# Patient Record
Sex: Female | Born: 1975
Health system: Southern US, Community
[De-identification: ages and names within clinical notes are randomized; demographics above are authoritative.]

## PROBLEM LIST (undated history)

## (undated) DIAGNOSIS — E7800 Pure hypercholesterolemia, unspecified: Secondary | ICD-10-CM

## (undated) DIAGNOSIS — K219 Gastro-esophageal reflux disease without esophagitis: Secondary | ICD-10-CM

## (undated) DIAGNOSIS — G43909 Migraine, unspecified, not intractable, without status migrainosus: Secondary | ICD-10-CM

## (undated) DIAGNOSIS — Z8669 Personal history of other diseases of the nervous system and sense organs: Secondary | ICD-10-CM

## (undated) DIAGNOSIS — Z860100 Personal history of colon polyps, unspecified: Secondary | ICD-10-CM

## (undated) DIAGNOSIS — Z21 Asymptomatic human immunodeficiency virus [HIV] infection status: Secondary | ICD-10-CM

## (undated) DIAGNOSIS — F329 Major depressive disorder, single episode, unspecified: Secondary | ICD-10-CM

## (undated) DIAGNOSIS — Z8742 Personal history of other diseases of the female genital tract: Secondary | ICD-10-CM

## (undated) DIAGNOSIS — G4730 Sleep apnea, unspecified: Secondary | ICD-10-CM

## (undated) DIAGNOSIS — R12 Heartburn: Secondary | ICD-10-CM

## (undated) DIAGNOSIS — J302 Other seasonal allergic rhinitis: Secondary | ICD-10-CM

## (undated) DIAGNOSIS — F32A Depression, unspecified: Secondary | ICD-10-CM

## (undated) DIAGNOSIS — B20 Human immunodeficiency virus [HIV] disease: Secondary | ICD-10-CM

## (undated) DIAGNOSIS — N189 Chronic kidney disease, unspecified: Secondary | ICD-10-CM

## (undated) DIAGNOSIS — N289 Disorder of kidney and ureter, unspecified: Secondary | ICD-10-CM

## (undated) DIAGNOSIS — J45909 Unspecified asthma, uncomplicated: Secondary | ICD-10-CM

## (undated) DIAGNOSIS — E669 Obesity, unspecified: Secondary | ICD-10-CM

## (undated) DIAGNOSIS — F411 Generalized anxiety disorder: Secondary | ICD-10-CM

## (undated) DIAGNOSIS — E538 Deficiency of other specified B group vitamins: Secondary | ICD-10-CM

## (undated) DIAGNOSIS — J189 Pneumonia, unspecified organism: Secondary | ICD-10-CM

## (undated) DIAGNOSIS — T7840XA Allergy, unspecified, initial encounter: Secondary | ICD-10-CM

## (undated) DIAGNOSIS — E559 Vitamin D deficiency, unspecified: Secondary | ICD-10-CM

## (undated) DIAGNOSIS — K829 Disease of gallbladder, unspecified: Secondary | ICD-10-CM

## (undated) DIAGNOSIS — F419 Anxiety disorder, unspecified: Secondary | ICD-10-CM

## (undated) DIAGNOSIS — R569 Unspecified convulsions: Secondary | ICD-10-CM

## (undated) HISTORY — PX: TOE SURGERY: SHX1073

## (undated) HISTORY — DX: Deficiency of other specified B group vitamins: E53.8

## (undated) HISTORY — DX: Pure hypercholesterolemia, unspecified: E78.00

## (undated) HISTORY — PX: UPPER GI ENDOSCOPY: SHX6162

## (undated) HISTORY — PX: ABDOMINAL HYSTERECTOMY: SHX81

## (undated) HISTORY — DX: Disease of gallbladder, unspecified: K82.9

## (undated) HISTORY — DX: Unspecified asthma, uncomplicated: J45.909

## (undated) HISTORY — DX: Personal history of other diseases of the female genital tract: Z87.42

## (undated) HISTORY — PX: COLONOSCOPY: SHX174

## (undated) HISTORY — DX: Heartburn: R12

## (undated) HISTORY — DX: Personal history of colon polyps, unspecified: Z86.0100

## (undated) HISTORY — DX: Vitamin D deficiency, unspecified: E55.9

## (undated) HISTORY — DX: Sleep apnea, unspecified: G47.30

## (undated) HISTORY — DX: Human immunodeficiency virus (HIV) disease: B20

## (undated) HISTORY — DX: Asymptomatic human immunodeficiency virus (hiv) infection status: Z21

## (undated) HISTORY — DX: Gastro-esophageal reflux disease without esophagitis: K21.9

## (undated) HISTORY — DX: Anxiety disorder, unspecified: F41.9

## (undated) HISTORY — DX: Personal history of colonic polyps: Z86.010

## (undated) HISTORY — DX: Generalized anxiety disorder: F41.1

## (undated) HISTORY — PX: WISDOM TOOTH EXTRACTION: SHX21

## (undated) HISTORY — DX: Depression, unspecified: F32.A

## (undated) HISTORY — DX: Allergy, unspecified, initial encounter: T78.40XA

## (undated) HISTORY — DX: Unspecified convulsions: R56.9

## (undated) HISTORY — DX: Migraine, unspecified, not intractable, without status migrainosus: G43.909

## (undated) HISTORY — DX: Personal history of other diseases of the nervous system and sense organs: Z86.69

## (undated) HISTORY — PX: CHOLECYSTECTOMY: SHX55

## (undated) HISTORY — DX: Obesity, unspecified: E66.9

---
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
## 1998-02-17 ENCOUNTER — Other Ambulatory Visit: Admission: RE | Admit: 1998-02-17 | Discharge: 1998-02-17 | Payer: Self-pay | Admitting: Obstetrics and Gynecology

## 1998-06-27 ENCOUNTER — Encounter: Admission: RE | Admit: 1998-06-27 | Discharge: 1998-09-25 | Payer: Self-pay | Admitting: Internal Medicine

## 1998-07-31 ENCOUNTER — Ambulatory Visit (HOSPITAL_COMMUNITY): Admission: RE | Admit: 1998-07-31 | Discharge: 1998-07-31 | Payer: Self-pay | Admitting: Gastroenterology

## 1999-11-19 ENCOUNTER — Other Ambulatory Visit: Admission: RE | Admit: 1999-11-19 | Discharge: 1999-11-19 | Payer: Self-pay | Admitting: Obstetrics and Gynecology

## 2000-04-19 ENCOUNTER — Encounter: Payer: Self-pay | Admitting: Internal Medicine

## 2000-04-19 ENCOUNTER — Encounter: Admission: RE | Admit: 2000-04-19 | Discharge: 2000-04-19 | Payer: Self-pay | Admitting: Internal Medicine

## 2000-05-13 ENCOUNTER — Other Ambulatory Visit: Admission: RE | Admit: 2000-05-13 | Discharge: 2000-05-13 | Payer: Self-pay | Admitting: Obstetrics and Gynecology

## 2000-09-07 ENCOUNTER — Other Ambulatory Visit: Admission: RE | Admit: 2000-09-07 | Discharge: 2000-09-07 | Payer: Self-pay | Admitting: Obstetrics and Gynecology

## 2001-03-12 ENCOUNTER — Encounter: Payer: Self-pay | Admitting: *Deleted

## 2001-03-12 ENCOUNTER — Emergency Department (HOSPITAL_COMMUNITY): Admission: EM | Admit: 2001-03-12 | Discharge: 2001-03-12 | Payer: Self-pay | Admitting: Emergency Medicine

## 2002-05-30 ENCOUNTER — Other Ambulatory Visit: Admission: RE | Admit: 2002-05-30 | Discharge: 2002-05-30 | Payer: Self-pay | Admitting: *Deleted

## 2003-02-11 ENCOUNTER — Emergency Department (HOSPITAL_COMMUNITY): Admission: EM | Admit: 2003-02-11 | Discharge: 2003-02-12 | Payer: Self-pay | Admitting: Emergency Medicine

## 2003-04-05 ENCOUNTER — Ambulatory Visit (HOSPITAL_COMMUNITY): Admission: RE | Admit: 2003-04-05 | Discharge: 2003-04-05 | Payer: Self-pay | Admitting: Internal Medicine

## 2003-11-18 ENCOUNTER — Other Ambulatory Visit: Admission: RE | Admit: 2003-11-18 | Discharge: 2003-11-18 | Payer: Self-pay | Admitting: *Deleted

## 2004-03-13 ENCOUNTER — Emergency Department (HOSPITAL_COMMUNITY): Admission: EM | Admit: 2004-03-13 | Discharge: 2004-03-13 | Payer: Self-pay | Admitting: Family Medicine

## 2005-08-08 ENCOUNTER — Emergency Department (HOSPITAL_COMMUNITY): Admission: EM | Admit: 2005-08-08 | Discharge: 2005-08-08 | Payer: Self-pay | Admitting: Emergency Medicine

## 2006-03-23 ENCOUNTER — Other Ambulatory Visit: Admission: RE | Admit: 2006-03-23 | Discharge: 2006-03-23 | Payer: Self-pay | Admitting: *Deleted

## 2006-11-04 ENCOUNTER — Emergency Department (HOSPITAL_COMMUNITY): Admission: EM | Admit: 2006-11-04 | Discharge: 2006-11-04 | Payer: Self-pay | Admitting: Emergency Medicine

## 2007-02-12 ENCOUNTER — Emergency Department (HOSPITAL_COMMUNITY): Admission: EM | Admit: 2007-02-12 | Discharge: 2007-02-12 | Payer: Self-pay | Admitting: Family Medicine

## 2007-04-07 ENCOUNTER — Emergency Department (HOSPITAL_COMMUNITY): Admission: EM | Admit: 2007-04-07 | Discharge: 2007-04-07 | Payer: Self-pay | Admitting: Family Medicine

## 2009-04-18 ENCOUNTER — Emergency Department (HOSPITAL_BASED_OUTPATIENT_CLINIC_OR_DEPARTMENT_OTHER): Admission: EM | Admit: 2009-04-18 | Discharge: 2009-04-19 | Payer: Self-pay | Admitting: Emergency Medicine

## 2010-04-27 ENCOUNTER — Emergency Department (HOSPITAL_COMMUNITY): Admission: EM | Admit: 2010-04-27 | Discharge: 2010-04-27 | Payer: Self-pay | Admitting: Emergency Medicine

## 2010-04-29 ENCOUNTER — Encounter: Admission: RE | Admit: 2010-04-29 | Discharge: 2010-04-29 | Payer: Self-pay | Admitting: Internal Medicine

## 2010-04-30 ENCOUNTER — Encounter (INDEPENDENT_AMBULATORY_CARE_PROVIDER_SITE_OTHER): Payer: Self-pay

## 2010-04-30 ENCOUNTER — Ambulatory Visit (HOSPITAL_COMMUNITY): Admission: EM | Admit: 2010-04-30 | Discharge: 2010-05-01 | Payer: Self-pay | Admitting: General Surgery

## 2010-04-30 ENCOUNTER — Other Ambulatory Visit: Payer: Self-pay | Admitting: Emergency Medicine

## 2011-01-10 LAB — POCT URINALYSIS DIP (DEVICE)
Bilirubin Urine: NEGATIVE
Glucose, UA: NEGATIVE mg/dL
Ketones, ur: NEGATIVE mg/dL
Protein, ur: NEGATIVE mg/dL
Specific Gravity, Urine: 1.02 (ref 1.005–1.030)

## 2011-01-10 LAB — COMPREHENSIVE METABOLIC PANEL
AST: 28 U/L (ref 0–37)
BUN: 14 mg/dL (ref 6–23)
CO2: 21 mEq/L (ref 19–32)
Chloride: 107 mEq/L (ref 96–112)
Creatinine, Ser: 0.9 mg/dL (ref 0.4–1.2)
GFR calc non Af Amer: >60 mL/min (ref >60)
Glucose, Bld: 112 mg/dL — ABNORMAL HIGH (ref 70–99)
Total Bilirubin: 0.4 mg/dL (ref 0.3–1.2)

## 2011-01-10 LAB — CBC
HCT: 39.7 % (ref 36.0–46.0)
HCT: 37.8 % (ref 36.0–46.0)
Hemoglobin: 13.1 g/dL (ref 12.0–15.0)
MCH: 31.9 pg (ref 26.0–34.0)
MCHC: 34.6 g/dL (ref 30.0–36.0)
MCV: 92.7 fL (ref 78.0–100.0)
MCV: 92.2 fL (ref 78.0–100.0)
RBC: 4.28 MIL/uL (ref 3.87–5.11)
RBC: 4.1 MIL/uL (ref 3.87–5.11)
RDW: 13.2 % (ref 11.5–15.5)
WBC: 6.8 10*3/uL (ref 4.0–10.5)

## 2011-01-10 LAB — POCT I-STAT, CHEM 8
BUN: 8 mg/dL (ref 6–23)
Creatinine, Ser: 0.8 mg/dL (ref 0.4–1.2)
Glucose, Bld: 110 mg/dL — ABNORMAL HIGH (ref 70–99)
Potassium: 4.1 mEq/L (ref 3.5–5.1)
Sodium: 140 mEq/L (ref 135–145)

## 2011-01-10 LAB — PREGNANCY, URINE: Preg Test, Ur: NEGATIVE

## 2011-01-10 LAB — DIFFERENTIAL
Basophils Absolute: 0 10*3/uL (ref 0.0–0.1)
Basophils Absolute: 0.1 10*3/uL (ref 0.0–0.1)
Eosinophils Relative: 1 % (ref 0–5)
Eosinophils Relative: 3 % (ref 0–5)
Lymphocytes Relative: 35 % (ref 12–46)
Lymphocytes Relative: 54 % — ABNORMAL HIGH (ref 12–46)
Lymphs Abs: 2.4 10*3/uL (ref 0.7–4.0)
Monocytes Absolute: 0.4 10*3/uL (ref 0.1–1.0)
Neutro Abs: 3.9 10*3/uL (ref 1.7–7.7)
Neutro Abs: 2.7 10*3/uL (ref 1.7–7.7)
Neutrophils Relative %: 38 % — ABNORMAL LOW (ref 43–77)

## 2011-01-10 LAB — HEPATIC FUNCTION PANEL
ALT: 41 U/L — ABNORMAL HIGH (ref 0–35)
AST: 31 U/L (ref 0–37)
Alkaline Phosphatase: 81 U/L (ref 39–117)
Bilirubin, Direct: 0.1 mg/dL (ref 0.0–0.3)
Total Protein: 7.9 g/dL (ref 6.0–8.3)

## 2011-01-10 LAB — LIPASE, BLOOD: Lipase: 96 U/L (ref 23–300)

## 2011-08-12 LAB — POCT URINALYSIS DIP (DEVICE)
Glucose, UA: NEGATIVE
Ketones, ur: NEGATIVE
Nitrite: NEGATIVE
Operator id: 116391
Specific Gravity, Urine: >=1.03
Urobilinogen, UA: 0.2
pH: 6

## 2014-10-25 HISTORY — PX: TONSILLECTOMY: SUR1361

## 2016-04-09 ENCOUNTER — Telehealth: Payer: Self-pay

## 2016-04-09 NOTE — Telephone Encounter (Signed)
Medical records received from Dr Marcelo Baldy, [place] .  Patient's last vist was 5-17 and next due in December.  Patient has been scheduled with labs and office visit. Patient contacted regarding new intake appointment. Date and time given. Information given regarding documents needed to qualify for financial eligibility.   Office  Phone [phone] Laverle Patter, RN

## 2016-06-07 ENCOUNTER — Ambulatory Visit: Payer: Self-pay

## 2016-06-08 ENCOUNTER — Other Ambulatory Visit: Payer: Self-pay | Admitting: *Deleted

## 2016-06-08 MED ORDER — ELVITEG-COBIC-EMTRICIT-TENOFAF 150-150-200-10 MG PO TABS: 1.0000 | ORAL_TABLET | Freq: Every day | ORAL | 5 refills | Status: DC

## 2016-06-08 NOTE — Telephone Encounter (Signed)
[address] new patient.

## 2016-06-16 ENCOUNTER — Encounter: Payer: Self-pay | Admitting: Internal Medicine

## 2016-07-12 ENCOUNTER — Telehealth: Payer: Self-pay | Admitting: *Deleted

## 2016-07-12 NOTE — Telephone Encounter (Signed)
Patient transferring, not scheduled to be seen until December, is asking for refills of her medication. Patient stated she would not call her previous prescriber for refills. Landis Gandy, RN

## 2016-07-12 NOTE — Telephone Encounter (Signed)
It is fine to refill until we see her

## 2016-07-13 ENCOUNTER — Other Ambulatory Visit: Payer: Self-pay | Admitting: *Deleted

## 2016-07-13 DIAGNOSIS — B20 Human immunodeficiency virus [HIV] disease: Secondary | ICD-10-CM

## 2016-07-13 MED ORDER — ELVITEG-COBIC-EMTRICIT-TENOFAF 150-150-200-10 MG PO TABS: 1.0000 | ORAL_TABLET | Freq: Every day | ORAL | 5 refills | Status: DC

## 2016-07-13 NOTE — Telephone Encounter (Signed)
Prescription refilled, patient notified. Sent to [facility] for pick up today.

## 2016-09-27 ENCOUNTER — Ambulatory Visit: Payer: Self-pay

## 2016-09-27 ENCOUNTER — Other Ambulatory Visit (INDEPENDENT_AMBULATORY_CARE_PROVIDER_SITE_OTHER): Payer: Self-pay

## 2016-09-27 DIAGNOSIS — B20 Human immunodeficiency virus [HIV] disease: Secondary | ICD-10-CM

## 2016-09-27 DIAGNOSIS — Z113 Encounter for screening for infections with a predominantly sexual mode of transmission: Secondary | ICD-10-CM

## 2016-09-27 DIAGNOSIS — Z79899 Other long term (current) drug therapy: Secondary | ICD-10-CM

## 2016-09-27 LAB — CBC WITH DIFFERENTIAL/PLATELET
BASOS PCT: 1 %
Basophils Absolute: 51 cells/uL (ref 0–200)
EOS PCT: 4 %
Eosinophils Absolute: 204 cells/uL (ref 15–500)
HCT: 42.6 % (ref 35.0–45.0)
Hemoglobin: 13.9 g/dL (ref 11.7–15.5)
LYMPHS PCT: 46 %
Lymphs Abs: 2346 cells/uL (ref 850–3900)
MCH: 32.2 pg (ref 27.0–33.0)
MCHC: 32.6 g/dL (ref 32.0–36.0)
MCV: 98.6 fL (ref 80.0–100.0)
MONOS PCT: 7 %
MPV: 10.3 fL (ref 7.5–12.5)
Monocytes Absolute: 357 cells/uL (ref 200–950)
Neutro Abs: 2142 cells/uL (ref 1500–7800)
Neutrophils Relative %: 42 %
PLATELETS: 322 10*3/uL (ref 140–400)
RBC: 4.32 MIL/uL (ref 3.80–5.10)
RDW: 13.3 % (ref 11.0–15.0)
WBC: 5.1 10*3/uL (ref 3.8–10.8)

## 2016-09-27 LAB — COMPLETE METABOLIC PANEL WITH GFR
ALT: 40 U/L — AB (ref 6–29)
AST: 30 U/L (ref 10–30)
Albumin: 4.3 g/dL (ref 3.6–5.1)
Alkaline Phosphatase: 79 U/L (ref 33–115)
BUN: 13 mg/dL (ref 7–25)
CHLORIDE: 102 mmol/L (ref 98–110)
CO2: 27 mmol/L (ref 20–31)
CREATININE: 1.04 mg/dL (ref 0.50–1.10)
Calcium: 9.4 mg/dL (ref 8.6–10.2)
GFR, EST AFRICAN AMERICAN: 78 mL/min (ref >=60)
GFR, Est Non African American: 68 mL/min (ref >=60)
Glucose, Bld: 96 mg/dL (ref 65–99)
Potassium: 4.5 mmol/L (ref 3.5–5.3)
Sodium: 137 mmol/L (ref 135–146)
Total Bilirubin: 0.3 mg/dL (ref 0.2–1.2)
Total Protein: 7.2 g/dL (ref 6.1–8.1)

## 2016-09-27 LAB — LIPID PANEL
CHOLESTEROL: 256 mg/dL — AB (ref <200)
HDL: 38 mg/dL — ABNORMAL LOW (ref >50)
LDL Cholesterol: 174 mg/dL — ABNORMAL HIGH (ref <100)
Total CHOL/HDL Ratio: 6.7 Ratio — ABNORMAL HIGH (ref <5.0)
Triglycerides: 220 mg/dL — ABNORMAL HIGH (ref <150)
VLDL: 44 mg/dL — ABNORMAL HIGH (ref <30)

## 2016-09-28 LAB — RPR

## 2016-09-28 LAB — HEPATITIS A ANTIBODY, TOTAL: HEP A TOTAL AB: NONREACTIVE

## 2016-09-28 LAB — URINALYSIS
BILIRUBIN URINE: NEGATIVE
GLUCOSE, UA: NEGATIVE
Hgb urine dipstick: NEGATIVE
Ketones, ur: NEGATIVE
Leukocytes, UA: NEGATIVE
Nitrite: NEGATIVE
PH: 5.5 (ref 5.0–8.0)
Protein, ur: NEGATIVE
SPECIFIC GRAVITY, URINE: 1.024 (ref 1.001–1.035)

## 2016-09-28 LAB — T-HELPER CELL (CD4) - (RCID CLINIC ONLY)
CD4 T CELL HELPER: 52 % (ref 33–55)
CD4 T Cell Abs: 1270 /uL (ref 400–2700)

## 2016-09-28 LAB — URINE CYTOLOGY ANCILLARY ONLY
CHLAMYDIA, DNA PROBE: NEGATIVE
Neisseria Gonorrhea: NEGATIVE

## 2016-09-28 LAB — HEPATITIS C ANTIBODY: HCV AB: NEGATIVE

## 2016-09-28 LAB — HEPATITIS B CORE ANTIBODY, TOTAL: Hep B Core Total Ab: NONREACTIVE

## 2016-09-28 LAB — HEPATITIS B SURFACE ANTIBODY,QUALITATIVE: Hep B S Ab: POSITIVE — AB

## 2016-09-28 LAB — HEPATITIS B SURFACE ANTIGEN: HEP B S AG: NEGATIVE

## 2016-09-28 LAB — HIV-1 RNA ULTRAQUANT REFLEX TO GENTYP+
HIV 1 RNA Quant: <20 copies/mL (ref <20)
HIV-1 RNA Quant, Log: <1.3 Log copies/mL (ref <1.30)

## 2016-09-30 LAB — QUANTIFERON TB GOLD ASSAY (BLOOD)
Interferon Gamma Release Assay: NEGATIVE
Mitogen-Nil: 7.13 IU/mL
Quantiferon Nil Value: 0.02 IU/mL
Quantiferon Tb Ag Minus Nil Value: 0 IU/mL

## 2016-10-02 LAB — HLA B*5701: HLA-B*5701 w/rflx HLA-B High: NEGATIVE

## 2016-10-11 ENCOUNTER — Ambulatory Visit: Payer: Self-pay | Admitting: Internal Medicine

## 2016-11-09 ENCOUNTER — Ambulatory Visit: Payer: Self-pay | Admitting: Internal Medicine

## 2016-11-23 ENCOUNTER — Ambulatory Visit (INDEPENDENT_AMBULATORY_CARE_PROVIDER_SITE_OTHER): Payer: Self-pay

## 2016-11-23 DIAGNOSIS — Z23 Encounter for immunization: Secondary | ICD-10-CM

## 2016-12-23 ENCOUNTER — Ambulatory Visit (INDEPENDENT_AMBULATORY_CARE_PROVIDER_SITE_OTHER): Payer: Self-pay | Admitting: Internal Medicine

## 2016-12-23 ENCOUNTER — Encounter: Payer: Self-pay | Admitting: Internal Medicine

## 2016-12-23 VITALS — BP 139/84 | HR 88 | Temp 98.6°F | Ht 68.0 in | Wt 262.0 lb

## 2016-12-23 DIAGNOSIS — H6243 Otitis externa in other diseases classified elsewhere, bilateral: Secondary | ICD-10-CM

## 2016-12-23 DIAGNOSIS — E782 Mixed hyperlipidemia: Secondary | ICD-10-CM

## 2016-12-23 DIAGNOSIS — Z23 Encounter for immunization: Secondary | ICD-10-CM

## 2016-12-23 DIAGNOSIS — F320 Major depressive disorder, single episode, mild: Secondary | ICD-10-CM

## 2016-12-23 DIAGNOSIS — B20 Human immunodeficiency virus [HIV] disease: Secondary | ICD-10-CM

## 2016-12-23 MED ORDER — ATORVASTATIN CALCIUM 20 MG PO TABS: 20.0000 mg | ORAL_TABLET | Freq: Every day | ORAL | 11 refills | Status: DC

## 2016-12-23 MED ORDER — NEOMYCIN-POLYMYXIN-HC 3.5-10000-1 OT SOLN: 4.0000 [drp] | Freq: Four times a day (QID) | OTIC | 0 refills | Status: DC

## 2016-12-23 MED ORDER — CITALOPRAM HYDROBROMIDE 20 MG PO TABS: 20.0000 mg | ORAL_TABLET | Freq: Every day | ORAL | 11 refills | Status: DC

## 2016-12-23 MED ORDER — TRAZODONE HCL 50 MG PO TABS: 50.0000 mg | ORAL_TABLET | Freq: Every day | ORAL | 11 refills | Status: DC

## 2016-12-23 NOTE — Progress Notes (Signed)
Patient ID: Julie Fox, female   DOB: 1976/04/11, 41 y.o.   MRN: [id number]  HPI 41yo f with hiv disease dx in 2013,BL CD 4 count of 762 VL 130K with pansensitive geno, started ARTin March 2014 with stribild then switched to genvoya. Has excellent adherence at time of switch cd 4 count of 1300 VL undetectable. She moved back to [place] to be closer to family and back working with the [employer]. She is looking for PCP as well as ob/gyn  She is no longer with the person that she contracted HIV from. RF for HIV , heterosexual sex.   She works Network engineer job for [employer]  She has run out of her medicatiosn for depression  Pmhx: Hx of depression, anxiety S/p chole S/p tonsillectomy S/p foot surgery as a child Seasonal allergies  Outpatient Encounter Prescriptions as of 12/23/2016  Medication Sig  . elvitegravir-cobicistat-emtricitabine-tenofovir (GENVOYA) 150-150-200-10 MG TABS tablet Take 1 tablet by mouth daily with breakfast.   No facility-administered encounter medications on file as of 12/23/2016.     family history includes Cancer in her father and maternal grandfather; Diabetes in her mother; Hyperlipidemia in her mother; Hypertension in her mother; Leukemia in her maternal grandmother.   Social History  Substance Use Topics  . Smoking status: Current Every Day Smoker    Packs/day: 0.10    Types: Cigarettes    Start date: 12/24/1995  . Smokeless tobacco: Never Used     Comment: cutting back. chantix did not work.  . Alcohol use Not on file     Comment: once a month    Health Maintenance Due  Topic Date Due  . TETANUS/TDAP  06/29/1996  . PAP SMEAR  06/29/1998     Review of Systems Review of Systems  Constitutional: Negative for fever, chills, diaphoresis, activity change, appetite change, fatigue and unexpected weight change.  HENT: + ear pain. Negative for congestion, sore throat, rhinorrhea, sneezing, trouble swallowing and sinus pressure.  Eyes: Negative for  photophobia and visual disturbance.  Respiratory: Negative for cough, chest tightness, shortness of breath, wheezing and stridor.  Cardiovascular: Negative for chest pain, palpitations and leg swelling.  Gastrointestinal: Negative for nausea, vomiting, abdominal pain, diarrhea, constipation, blood in stool, abdominal distention and anal bleeding.  Genitourinary: Negative for dysuria, hematuria, flank pain and difficulty urinating.  Musculoskeletal: Negative for myalgias, back pain, joint swelling, arthralgias and gait problem.  Skin: Negative for color change, pallor, rash and wound.  Neurological: Negative for dizziness, tremors, weakness and light-headedness.  Hematological: Negative for adenopathy. Does not bruise/bleed easily.  Psychiatric/Behavioral: Negative for behavioral problems, confusion, sleep disturbance, dysphoric mood, decreased concentration and agitation.    Physical Exam  BP 139/84   Pulse 88   Temp 98.6 F (37 C) (Oral)   Ht 5\' 8"  (1.727 m)   Wt 262 lb (118.8 kg)   LMP 12/16/2016 (Exact Date)   BMI 39.84 kg/m  Physical Exam  Constitutional:  oriented to person, place, and time. appears well-developed and well-nourished. No distress.  HENT: [place]/AT, PERRLA, no scleral icterus. TM clear on the right , slightly red external auditory canal on the left Mouth/Throat: Oropharynx is clear and moist. No oropharyngeal exudate.  Cardiovascular: Normal rate, regular rhythm and normal heart sounds. Exam reveals no gallop and no friction rub.  No murmur heard.  Pulmonary/Chest: Effort normal and breath sounds normal. No respiratory distress.  has no wheezes.  Neck = supple, no nuchal rigidity Abdominal: Soft. Bowel sounds are normal.  exhibits no distension. There is no tenderness.  Lymphadenopathy: no cervical adenopathy. No axillary adenopathy Neurological: alert and oriented to person, place, and time.  Skin: Skin is warm and dry. No rash noted. No erythema.  Psychiatric: a  normal mood and affect.  behavior is normal.    Lab Results  Component Value Date   CD4TCELL 52 09/27/2016   Lab Results  Component Value Date   CD4TABS 1,270 09/27/2016   Lab Results  Component Value Date   HIV1RNAQUANT <20 09/27/2016   Lab Results  Component Value Date   HEPBSAB POS (A) 09/27/2016   Lab Results  Component Value Date   LABRPR NON REAC 09/27/2016    CBC Lab Results  Component Value Date   WBC 5.1 09/27/2016   RBC 4.32 09/27/2016   HGB 13.9 09/27/2016   HCT 42.6 09/27/2016   PLT 322 09/27/2016   MCV 98.6 09/27/2016   MCH 32.2 09/27/2016   MCHC 32.6 09/27/2016   RDW 13.3 09/27/2016   LYMPHSABS 2,346 09/27/2016   MONOABS 357 09/27/2016   EOSABS 204 09/27/2016    BMET Lab Results  Component Value Date   NA 137 09/27/2016   K 4.5 09/27/2016   CL 102 09/27/2016   CO2 27 09/27/2016   GLUCOSE 96 09/27/2016   BUN 13 09/27/2016   CREATININE 1.04 09/27/2016   CALCIUM 9.4 09/27/2016   GFRNONAA 68 09/27/2016   GFRAA 78 09/27/2016    Assessment and Plan hiv disease= will continue on genvoya. Currently on adap but will get coverage in April  Depression = will start citalopram 20mg  daily and trazadone 50mg  qhs  Health maintenance = will give meningococcal and gave hep a #1  Otitis externa = will give hc ear drops  hld = will start artovastatin 20mg  qhs

## 2017-02-23 ENCOUNTER — Ambulatory Visit (INDEPENDENT_AMBULATORY_CARE_PROVIDER_SITE_OTHER): Payer: Commercial Managed Care - HMO | Admitting: Internal Medicine

## 2017-02-23 ENCOUNTER — Encounter: Payer: Self-pay | Admitting: Internal Medicine

## 2017-02-23 VITALS — BP 128/82 | HR 81 | Temp 98.5°F | Ht 68.0 in | Wt 264.0 lb

## 2017-02-23 DIAGNOSIS — H9201 Otalgia, right ear: Secondary | ICD-10-CM | POA: Diagnosis not present

## 2017-02-23 DIAGNOSIS — B20 Human immunodeficiency virus [HIV] disease: Secondary | ICD-10-CM

## 2017-02-23 DIAGNOSIS — Z79899 Other long term (current) drug therapy: Secondary | ICD-10-CM | POA: Diagnosis not present

## 2017-02-23 LAB — COMPLETE METABOLIC PANEL WITH GFR
ALBUMIN: 4 g/dL (ref 3.6–5.1)
ALK PHOS: 86 U/L (ref 33–115)
ALT: 38 U/L — ABNORMAL HIGH (ref 6–29)
AST: 29 U/L (ref 10–30)
BILIRUBIN TOTAL: 0.3 mg/dL (ref 0.2–1.2)
BUN: 10 mg/dL (ref 7–25)
CO2: 23 mmol/L (ref 20–31)
Calcium: 8.9 mg/dL (ref 8.6–10.2)
Chloride: 105 mmol/L (ref 98–110)
Creat: 0.89 mg/dL (ref 0.50–1.10)
GFR, EST NON AFRICAN AMERICAN: 82 mL/min (ref >=60)
GLUCOSE: 100 mg/dL — AB (ref 65–99)
POTASSIUM: 4.1 mmol/L (ref 3.5–5.3)
SODIUM: 138 mmol/L (ref 135–146)
Total Protein: 6.8 g/dL (ref 6.1–8.1)

## 2017-02-23 LAB — CBC WITH DIFFERENTIAL/PLATELET
BASOS ABS: 82 {cells}/uL (ref 0–200)
Basophils Relative: 1 %
EOS ABS: 164 {cells}/uL (ref 15–500)
EOS PCT: 2 %
HCT: 39.1 % (ref 35.0–45.0)
Hemoglobin: 13.4 g/dL (ref 11.7–15.5)
Lymphocytes Relative: 37 %
Lymphs Abs: 3034 cells/uL (ref 850–3900)
MCH: 32.4 pg (ref 27.0–33.0)
MCHC: 34.3 g/dL (ref 32.0–36.0)
MCV: 94.7 fL (ref 80.0–100.0)
MONOS PCT: 6 %
MPV: 10.3 fL (ref 7.5–12.5)
Monocytes Absolute: 492 cells/uL (ref 200–950)
NEUTROS PCT: 54 %
Neutro Abs: 4428 cells/uL (ref 1500–7800)
PLATELETS: 308 10*3/uL (ref 140–400)
RBC: 4.13 MIL/uL (ref 3.80–5.10)
RDW: 13.7 % (ref 11.0–15.0)
WBC: 8.2 10*3/uL (ref 3.8–10.8)

## 2017-02-23 NOTE — Progress Notes (Signed)
RFV: hiv disease   Patient ID: Julie Fox, female   DOB: [birth year], 41 y.o.   MRN: [id number]  HPI 41 yo F with HIV disease, CD 4 count of 1270/VL<20 on genvoya. Doing well with adherence. No recent health issues, though still feels itchiness of her external auditory canal in right ear after having otitis externa. No drainage or pain. Not been using any Qtips  I have reviewed her records in [place] link  Ros: ear itchiness -right Outpatient Encounter Prescriptions as of 02/23/2017  Medication Sig  . atorvastatin (LIPITOR) 20 MG tablet Take 1 tablet (20 mg total) by mouth daily.  . citalopram (CELEXA) 20 MG tablet Take 1 tablet (20 mg total) by mouth daily.  Marland Kitchen elvitegravir-cobicistat-emtricitabine-tenofovir (GENVOYA) 150-150-200-10 MG TABS tablet Take 1 tablet by mouth daily with breakfast.  . traZODone (DESYREL) 50 MG tablet Take 1 tablet (50 mg total) by mouth at bedtime.  Marland Kitchen neomycin-polymyxin-hydrocortisone (CORTISPORIN) otic solution Place 4 drops into both ears 4 (four) times daily. (Patient not taking: Reported on 02/23/2017)   No facility-administered encounter medications on file as of 02/23/2017.      Patient Active Problem List   Diagnosis Date Noted  . HIV disease ([place]) 12/23/2016     Health Maintenance Due  Topic Date Due  . PAP SMEAR  06/29/1998    Social History  Substance Use Topics  . Smoking status: Current Every Day Smoker    Packs/day: 0.10    Types: Cigarettes    Start date: 12/24/1995  . Smokeless tobacco: Never Used     Comment: cutting back. chantix did not work.  . Alcohol use Not on file     Comment: once a month   Review of Systems Per hpi otherwise 12 point ros is negative Physical Exam   BP 128/82   Pulse 81   Temp 98.5 F (36.9 C) (Oral)   Ht 5\' 8"  (1.727 m)   Wt 264 lb (119.7 kg)   LMP 02/11/2017 (Exact Date)   BMI 40.14 kg/m   Physical Exam  Constitutional:  oriented to person, place, and time. appears well-developed and  well-nourished. No distress.  HENT: [place]/AT, PERRLA, no scleral icterus Mouth/Throat: Oropharynx is clear and moist. No oropharyngeal exudate.  Cardiovascular: Normal rate, regular rhythm and normal heart sounds. Exam reveals no gallop and no friction rub.  No murmur heard.  Pulmonary/Chest: Effort normal and breath sounds normal. No respiratory distress.  has no wheezes.  Neck = supple, no nuchal rigidity Lymphadenopathy: no cervical adenopathy. No axillary adenopathy Neurological: alert and oriented to person, place, and time.  Skin: Skin is warm and dry. No rash noted. No erythema.  Psychiatric: a normal mood and affect.  behavior is normal.   Lab Results  Component Value Date   CD4TCELL 52 09/27/2016   Lab Results  Component Value Date   CD4TABS 1,270 09/27/2016   Lab Results  Component Value Date   HIV1RNAQUANT <20 09/27/2016   Lab Results  Component Value Date   HEPBSAB POS (A) 09/27/2016   Lab Results  Component Value Date   LABRPR NON REAC 09/27/2016    CBC Lab Results  Component Value Date   WBC 5.1 09/27/2016   RBC 4.32 09/27/2016   HGB 13.9 09/27/2016   HCT 42.6 09/27/2016   PLT 322 09/27/2016   MCV 98.6 09/27/2016   MCH 32.2 09/27/2016   MCHC 32.6 09/27/2016   RDW 13.3 09/27/2016   LYMPHSABS 2,346 09/27/2016   MONOABS 357  09/27/2016   EOSABS 204 09/27/2016    BMET Lab Results  Component Value Date   NA 137 09/27/2016   K 4.5 09/27/2016   CL 102 09/27/2016   CO2 27 09/27/2016   GLUCOSE 96 09/27/2016   BUN 13 09/27/2016   CREATININE 1.04 09/27/2016   CALCIUM 9.4 09/27/2016   GFRNONAA 68 09/27/2016   GFRAA 78 09/27/2016    Assessment and Plan  HIV disease= well controlled. Continue on current regimen  Ear discomfort = TM exam was clear. No signs of otitis externa. Recommended not to use any qtips, can do a trial of antibacterial ointment to see if any difference in sensation  Long term medication use = creatinine function is stable. No  need for dose adjustment

## 2017-02-24 LAB — T-HELPER CELL (CD4) - (RCID CLINIC ONLY)
CD4 % Helper T Cell: 55 % (ref 33–55)
CD4 T CELL ABS: 1730 /uL (ref 400–2700)

## 2017-02-25 LAB — HIV-1 RNA QUANT-NO REFLEX-BLD
HIV 1 RNA QUANT: NOT DETECTED {copies}/mL
HIV-1 RNA Quant, Log: <1.3 Log copies/mL

## 2017-03-15 ENCOUNTER — Encounter: Payer: Self-pay | Admitting: Internal Medicine

## 2017-03-18 ENCOUNTER — Ambulatory Visit (INDEPENDENT_AMBULATORY_CARE_PROVIDER_SITE_OTHER): Payer: Commercial Managed Care - HMO | Admitting: Family Medicine

## 2017-03-18 ENCOUNTER — Encounter: Payer: Self-pay | Admitting: Family Medicine

## 2017-03-18 VITALS — BP 100/80 | HR 82 | Temp 98.3°F | Ht 67.75 in | Wt 259.8 lb

## 2017-03-18 DIAGNOSIS — H6983 Other specified disorders of Eustachian tube, bilateral: Secondary | ICD-10-CM | POA: Diagnosis not present

## 2017-03-18 DIAGNOSIS — L219 Seborrheic dermatitis, unspecified: Secondary | ICD-10-CM | POA: Diagnosis not present

## 2017-03-18 DIAGNOSIS — E785 Hyperlipidemia, unspecified: Secondary | ICD-10-CM

## 2017-03-18 DIAGNOSIS — L0292 Furuncle, unspecified: Secondary | ICD-10-CM | POA: Diagnosis not present

## 2017-03-18 DIAGNOSIS — Z8742 Personal history of other diseases of the female genital tract: Secondary | ICD-10-CM

## 2017-03-18 DIAGNOSIS — Z6839 Body mass index (BMI) 39.0-39.9, adult: Secondary | ICD-10-CM | POA: Diagnosis not present

## 2017-03-18 DIAGNOSIS — L03313 Cellulitis of chest wall: Secondary | ICD-10-CM

## 2017-03-18 DIAGNOSIS — Z723 Lack of physical exercise: Secondary | ICD-10-CM

## 2017-03-18 DIAGNOSIS — Z87898 Personal history of other specified conditions: Secondary | ICD-10-CM

## 2017-03-18 DIAGNOSIS — F411 Generalized anxiety disorder: Secondary | ICD-10-CM

## 2017-03-18 DIAGNOSIS — Z7689 Persons encountering health services in other specified circumstances: Secondary | ICD-10-CM | POA: Diagnosis not present

## 2017-03-18 DIAGNOSIS — B20 Human immunodeficiency virus [HIV] disease: Secondary | ICD-10-CM | POA: Diagnosis not present

## 2017-03-18 MED ORDER — DOXYCYCLINE HYCLATE 100 MG PO TABS: 100.0000 mg | ORAL_TABLET | Freq: Two times a day (BID) | ORAL | 0 refills | Status: AC

## 2017-03-18 NOTE — Patient Instructions (Signed)
BEFORE YOU LEAVE: -follow up: 3-5 days for boil -CPE in 3 months, come fasting  Call for Cognitive Behavioral Therapy for the anxiety - brochure provided.  Set up exam with gynecologist. Try aleve bid for 2 days prior to and first 2-3 days of periods.  Take the doxycycline as prescribe (stay out of the sun on this antibiotic, can not be pregnant on this antibiotic). Warm compresses twice daily. Follow up in 3-5 days - seek care sooner if worsening or concerns.  For the ears: Flonase 2 sprays each nostril daily for 1 month. Selsun blue in shower.  Eat a healthy diet. 5-9 servings of fresh or frozen fruits and veggies per day. 100 % whole grains. Avoid sweets and processed foods. Get at least 150 minutes of aerobic exercise per week.  It was very nice to meet you today!  WE NOW OFFER   [facility] FAST TRACK!!!  SAME DAY Appointments for ACUTE CARE  Such as: Sprains, Injuries, cuts, abrasions, rashes, muscle pain, joint pain, back pain Colds, flu, sore throats, headache, allergies, cough, fever  Ear pain, sinus and eye infections Abdominal pain, nausea, vomiting, diarrhea, upset stomach Animal/insect bites  3 Easy Ways to Schedule: Walk-In Scheduling Call in scheduling Mychart Sign-up: [email or web address]

## 2017-03-18 NOTE — Progress Notes (Signed)
HPI:  Julie Fox is here to establish care. Many issues and concerns to address today. Recently moved here from [place] to live with mother whom has health issues - lived her 5 years ago. 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) for management -reports excellent control and undetectable viral load for years -feels stressed when doctors focus on this and prefers we leave management of this to her ID specialist  Hx abnormal pap/heavy menstrual periods: -wants my recs on gynecologist in the area -abnormal pap 2012, hx prior cryo, reports normal regular paps  since  ROS negative for unless reported above: fevers, unintentional weight loss, hearing or vision loss, chest pain, palpitations, struggling to breath, hemoptysis, melena, hematochezia, hematuria, falls, loc, si, thoughts of self harm  Past Medical History:  Diagnosis Date  . GAD (generalized anxiety disorder)   . GERD (gastroesophageal reflux disease)   . History of epilepsy   . HIV (human immunodeficiency virus infection) ([place])   . Hx of abnormal cervical Pap smear    2012  per pt, then normal since   . Migraine   . Obesity   . Pure hypercholesterolemia     Past Surgical History:  Procedure Laterality Date  . CHOLECYSTECTOMY    . TOE SURGERY     age 45-cyst excision per patient  . TONSILLECTOMY  2016    Family History  Problem Relation Age of Onset  . Hypertension Mother   . Hyperlipidemia Mother   . Diabetes Mother   . Cancer Father   . Non-Hodgkin's lymphoma Maternal Grandmother   . Cancer Maternal Grandfather     Social History   Social History  . Marital status: Single    Spouse name: N/A  . Number of children: N/A  . Years of education: N/A   Social History Main Topics  . Smoking status: Current Every Day Smoker    Packs/day: 0.10    Types: Cigarettes    Start date: 12/24/1995  . Smokeless tobacco: Never Used     Comment: cutting back. chantix did not work.  . Alcohol use None     Comment: once a month  . Drug use: No  . Sexual activity: Not Currently    Partners: Male    Birth control/ protection: Condom     Comment: declined condoms   Other Topics Concern  . None   Social History Narrative  . None     Current Outpatient Prescriptions:  .  atorvastatin (LIPITOR) 20 MG tablet, Take 1 tablet (20 mg total) by mouth daily., Disp: 30 tablet, Rfl: 11 .  citalopram (CELEXA) 20 MG tablet, Take 1 tablet (20 mg  total) by mouth daily., Disp: 30 tablet, Rfl: 11 .  elvitegravir-cobicistat-emtricitabine-tenofovir (GENVOYA) 150-150-200-10 MG TABS tablet, Take 1 tablet by mouth daily with breakfast., Disp: 30 tablet, Rfl: 5 .  traZODone (DESYREL) 50 MG tablet, Take 1 tablet (50 mg total) by mouth at bedtime., Disp: 30 tablet, Rfl: 11 .  doxycycline (VIBRA-TABS) 100 MG tablet, Take 1 tablet (100 mg total) by mouth 2 (two) times daily., Disp: 10 tablet, Rfl: 0  EXAM:  Vitals:   03/18/17 1245  BP: 100/80  Pulse: 82  Temp: 98.3 F (36.8 C)    Body mass index is 39.79 kg/m.  GENERAL: vitals reviewed and listed above, alert,  oriented, appears well hydrated and in no acute distress  HEENT: atraumatic, conjunttiva clear, no obvious abnormalities on inspection of external nose and ears, normal appearance of ear canals and TMs except for dry skin and bilat clear effusions, clear nasal congestion, mild post oropharyngeal erythema with PND, no tonsillar edema or exudate, no sinus TTP  NECK: no obvious masses on inspection  LUNGS: clear to auscultation bilaterally, no wheezes, rales or rhonchi, good air movement  CV: HRRR, no peripheral edema  SKIN: small erythematous papule with small amount surrounding erythema and induration L lower breast, no definite abscess or fluctuant area  MS: moves all extremities without noticeable abnormality  PSYCH: pleasant and cooperative, no obvious depression or anxiety  ASSESSMENT AND PLAN:  Discussed the following assessment and plan: More than 50% of over 45 minutes spent in total in caring for this patient was spent face-to-face with the patient, counseling and/or coordinating care. Prior and current problems reviewed and summarized below:  Boil Cellulitis of chest wall -discussed potential etiologies, treatment options, risks, potential complications -she opted for trial compresses and doxy with follow up in 3-5 days -advised to seek care sooner if worsening, fevers, malaise, concerns  Dysfunction of both eustachian tubes Seborrheic dermatitis -likely cause ear issues -opted to try tx with flonase and selsun  GAD (generalized anxiety disorder) -continue current medications, advise regular exercise and adding CBT - names/numbers/brochure provided to schedule  Hyperlipidemia, unspecified hyperlipidemia type -lifestyle recs -cont statin -labs at CPE  Hx of abnormal cervical Pap smear -list of providers/practices and recommendations provided -advised to schedule pap  HIV (human immunodeficiency virus infection) ([place]) -managed by  ID  BMI  39.0-39.9,adult -lifestyle recs  Encounter to establish care -We reviewed the PMH, PSH, FH, SH, Meds and Allergies. -We provided refills for any medications we will prescribe as needed. -We addressed current concerns per orders and patient instructions. -We have asked for records for pertinent exams, studies, vaccines and notes from previous providers. -We have advised patient to follow up per instructions below.   Patient Instructions  BEFORE YOU LEAVE: -follow up: 3-5 days for boil -CPE in 3 months, come fasting  Call for Cognitive Behavioral Therapy for the anxiety - brochure provided.  Set up exam with gynecologist. Try aleve bid for 2 days prior to and first 2-3 days of periods.  Take the doxycycline as prescribe (stay out of the sun on this antibiotic, can not be pregnant on this antibiotic). Warm compresses twice daily. Follow up in 3-5 days - seek care sooner if worsening or concerns.  For the ears: Flonase 2 sprays each nostril daily for 1 month. Selsun blue in shower.  Eat a healthy diet. 5-9 servings of fresh or frozen fruits and veggies per day. 100 % whole grains. Avoid sweets and processed foods. Get at least 150 minutes of  aerobic exercise per week.  It was very nice to meet you today!  WE NOW OFFER   [facility] FAST TRACK!!!  SAME DAY Appointments for ACUTE CARE  Such as: Sprains, Injuries, cuts, abrasions, rashes, muscle pain, joint pain, back pain Colds, flu, sore throats, headache, allergies, cough, fever  Ear pain, sinus and eye infections Abdominal pain, nausea, vomiting, diarrhea, upset stomach Animal/insect bites  3 Easy Ways to Schedule: Walk-In Scheduling Call in scheduling Mychart Sign-up: [email or web address]             Colin Benton R.

## 2017-03-24 ENCOUNTER — Ambulatory Visit: Payer: Commercial Managed Care - HMO | Admitting: Family Medicine

## 2017-04-12 ENCOUNTER — Encounter: Payer: Self-pay | Admitting: Family Medicine

## 2017-04-12 ENCOUNTER — Ambulatory Visit (INDEPENDENT_AMBULATORY_CARE_PROVIDER_SITE_OTHER): Payer: Commercial Managed Care - HMO | Admitting: Family Medicine

## 2017-04-12 VITALS — BP 118/78 | HR 98 | Temp 98.6°F | Wt 262.8 lb

## 2017-04-12 DIAGNOSIS — M9900 Segmental and somatic dysfunction of head region: Secondary | ICD-10-CM

## 2017-04-12 DIAGNOSIS — H9203 Otalgia, bilateral: Secondary | ICD-10-CM

## 2017-04-12 DIAGNOSIS — Q670 Congenital facial asymmetry: Secondary | ICD-10-CM | POA: Diagnosis not present

## 2017-04-12 DIAGNOSIS — H6983 Other specified disorders of Eustachian tube, bilateral: Secondary | ICD-10-CM

## 2017-04-12 DIAGNOSIS — M26623 Arthralgia of bilateral temporomandibular joint: Secondary | ICD-10-CM

## 2017-04-12 NOTE — Patient Instructions (Addendum)
BEFORE YOU LEAVE: -follow up: 30 minute OMT visit at beginning or end of clinic in next 1-2 weeks for TMJ and ear pain -TMJ exercises  Continue the flonase 2 sprays each nostril daily for 2 more weeks, then 1 spray each nostril daily.  Do the exercises for the jaw and for the ear that I showed you.  Follow up sooner if worsening, new concerns or you are not improving with treatment.

## 2017-04-12 NOTE — Progress Notes (Signed)
HPI:  Acute visit for ear pain: -started: 1 week ago, chronic popping, fullness, chronic intermittent pain ant to ears, jaw pops -denies:fever, SOB, NVD, hearing loss, locking of jaw -hx of major dental/orthodontic treatments as a child and issues with jaw/ears since -reports she was given device to stretch and "break" her mouth as a child  ROS: See pertinent positives and negatives per HPI.  Past Medical History:  Diagnosis Date  . GAD (generalized anxiety disorder)   . GERD (gastroesophageal reflux disease)   . History of epilepsy   . HIV (human immunodeficiency virus infection) ([place])   . Hx of abnormal cervical Pap smear    2012 per pt, then normal since   . Migraine   . Obesity   . Pure hypercholesterolemia     Past Surgical History:  Procedure Laterality Date  . CHOLECYSTECTOMY    . TOE SURGERY     age 29-cyst excision per patient  . TONSILLECTOMY  2016    Family History  Problem Relation Age of Onset  . Hypertension Mother   . Hyperlipidemia Mother   . Diabetes Mother   . Cancer Father   . Non-Hodgkin's lymphoma Maternal Grandmother   . Cancer Maternal Grandfather     Social History   Social History  . Marital status: Single    Spouse name: N/A  . Number of children: N/A  . Years of education: N/A   Social History Main Topics  . Smoking status: Current Every Day Smoker    Packs/day: 0.10    Types: Cigarettes    Start date: 12/24/1995  . Smokeless tobacco: Never Used     Comment: cutting back. chantix did not work.  . Alcohol use None     Comment: once a month  . Drug use: No  . Sexual activity: Not Currently    Partners: Male    Birth control/ protection: Condom     Comment: declined condoms   Other Topics Concern  . None   Social History Narrative   Work or School: Engineer, structural, 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: lives with mother      Spiritual Beliefs:      Lifestyle: no regular exercise, diet not great        Current Outpatient  Prescriptions:  .  atorvastatin (LIPITOR) 20 MG tablet, Take 1 tablet (20 mg total) by mouth daily., Disp: 30 tablet, Rfl: 11 .  citalopram (CELEXA) 20 MG tablet, Take 1 tablet (20 mg total) by mouth daily., Disp: 30 tablet, Rfl: 11 .  elvitegravir-cobicistat-emtricitabine-tenofovir (GENVOYA) 150-150-200-10 MG TABS tablet, Take 1 tablet by mouth daily with breakfast., Disp: 30 tablet, Rfl: 5 .  traZODone (DESYREL) 50 MG tablet, Take 1 tablet (50 mg total) by mouth at bedtime., Disp: 30 tablet, Rfl: 11  EXAM:  Vitals:   04/12/17 1359  BP: 118/78  Pulse: 98  Temp: 98.6 F (37 C)    Body mass index is 40.25 kg/m.  GENERAL: vitals reviewed and listed above, alert, oriented, appears well hydrated and in no acute distress  HEENT: atraumatic, conjunttiva clear, she has some obvious facial structural asymmetries with slanted mouth, nose and R ear more inward the L, normal appearance of ear canals, clear effusion bilat, clear nasal congestion, mild post oropharyngeal erythema with PND, no tonsillar edema or exudate, no sinus TTP, some deviation and clicking of bilat TMJs with opening and closing the jaw, int rotated temporal bones R>L  NECK: no obvious masses on inspection  LUNGS: clear to auscultation bilaterally, no wheezes, rales or rhonchi, good air movement  CV: HRRR, no peripheral edema  MS: moves all extremities without noticeable abnormality  PSYCH: pleasant and cooperative, no obvious depression or anxiety  ASSESSMENT AND PLAN:  Discussed the following assessment and plan:  Dysfunction of both eustachian tubes  Bilateral temporomandibular joint pain  Otalgia of both ears  Facial asymmetries  Somatic dysfunction of head region  -we discussed possible serious and likely etiologies, workup and treatment, treatment risks and return precautions - I thinks her symptoms are a result of ETD and TMJ, both perhaps related to remote dental/orthodontic trauma -after this  discussion, Cathleen opted for continuation INS, OMT (discuss options, risks, potential benefits and she would like to try this prior to specialist referral - did some MF and taught her ear pull and galbreath technique today, return for more thorough eval and treatment, tmj home exercises -follow up advised OMT visit in 1-2 weeks -of course, we advised Lari  to return or notify a doctor immediately if symptoms worsen or persist or new concerns arise.  .     Patient Instructions  BEFORE YOU LEAVE: -follow up: 30 minute OMT visit at beginning or end of clinic in next 1-2 weeks for TMJ and ear pain -TMJ exercises  Continue the flonase 2 sprays each nostril daily for 2 more weeks, then 1 spray each nostril daily.  Do the exercises for the jaw and for the ear that I showed you.  Follow up sooner if worsening, new concerns or you are not improving with treatment.       Colin Benton R., DO

## 2017-04-14 ENCOUNTER — Ambulatory Visit: Payer: Commercial Managed Care - HMO | Admitting: Family Medicine

## 2017-04-26 ENCOUNTER — Encounter: Payer: Self-pay | Admitting: Family Medicine

## 2017-04-26 ENCOUNTER — Ambulatory Visit (INDEPENDENT_AMBULATORY_CARE_PROVIDER_SITE_OTHER): Payer: 59 | Admitting: Family Medicine

## 2017-04-26 VITALS — BP 116/80 | Temp 98.4°F | Ht 67.75 in | Wt 264.0 lb

## 2017-04-26 DIAGNOSIS — H9201 Otalgia, right ear: Secondary | ICD-10-CM

## 2017-04-26 DIAGNOSIS — H6983 Other specified disorders of Eustachian tube, bilateral: Secondary | ICD-10-CM | POA: Diagnosis not present

## 2017-04-26 DIAGNOSIS — H6690 Otitis media, unspecified, unspecified ear: Secondary | ICD-10-CM

## 2017-04-26 DIAGNOSIS — R519 Headache, unspecified: Secondary | ICD-10-CM

## 2017-04-26 DIAGNOSIS — R51 Headache: Secondary | ICD-10-CM

## 2017-04-26 MED ORDER — AZITHROMYCIN 250 MG PO TABS: ORAL_TABLET | ORAL | 0 refills | Status: DC

## 2017-04-26 NOTE — Progress Notes (Signed)
HPI:  Follow up ear pain/ETD. She wanted to try OMT for this today. Unfortunately however, she reports worsening ear pain bilat R > L and headache R temporal region for 1 week that is different from her prior migraines. She also saw some drainage from the R ear about 1 week ago that she thinks was brown. She has had some pain bilaterally in the SCM muscle region. She is taking and antihistamine and flonase. No fevers, malaise, vision changes, worst HA of life, chills.  ROS: See pertinent positives and negatives per HPI.  Past Medical History:  Diagnosis Date  . GAD (generalized anxiety disorder)   . GERD (gastroesophageal reflux disease)   . History of epilepsy   . HIV (human immunodeficiency virus infection) ([place])   . Hx of abnormal cervical Pap smear    2012 per pt, then normal since   . Migraine   . Obesity   . Pure hypercholesterolemia     Past Surgical History:  Procedure Laterality Date  . CHOLECYSTECTOMY    . TOE SURGERY     age 47-cyst excision per patient  . TONSILLECTOMY  2016    Family History  Problem Relation Age of Onset  . Hypertension Mother   . Hyperlipidemia Mother   . Diabetes Mother   . Cancer Father   . Non-Hodgkin's lymphoma Maternal Grandmother   . Cancer Maternal Grandfather     Social History   Social History  . Marital status: Single    Spouse name: N/A  . Number of children: N/A  . Years of education: N/A   Social History Main Topics  . Smoking status: Current Every Day Smoker    Packs/day: 0.10    Types: Cigarettes    Start date: 12/24/1995  . Smokeless tobacco: Never Used     Comment: cutting back. chantix did not work.  . Alcohol use None     Comment: once a month  . Drug use: No  . Sexual activity: Not Currently    Partners: Male    Birth control/ protection: Condom     Comment: declined condoms   Other Topics Concern  . None   Social History Narrative   Work or School: Engineer, structural, 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:  lives with mother      Spiritual Beliefs:      Lifestyle: no regular exercise, diet not great        Current Outpatient Prescriptions:  .  atorvastatin (LIPITOR) 20 MG tablet, Take 1 tablet (20 mg total) by mouth daily., Disp: 30 tablet, Rfl: 11 .  citalopram (CELEXA) 20 MG tablet, Take 1 tablet (20 mg total) by mouth daily., Disp: 30 tablet, Rfl: 11 .  elvitegravir-cobicistat-emtricitabine-tenofovir (GENVOYA) 150-150-200-10 MG TABS tablet, Take 1 tablet by mouth daily with breakfast., Disp: 30 tablet, Rfl: 5 .  traZODone (DESYREL) 50 MG tablet, Take 1 tablet (50 mg total) by mouth at bedtime., Disp: 30 tablet, Rfl: 11 .  azithromycin (ZITHROMAX) 250 MG tablet, 2 tabs day 1, then one tab daily, Disp: 6 tablet, Rfl: 0  EXAM:  Vitals:   04/26/17 1104  BP: 116/80  Temp: 98.4 F (36.9 C)    Body mass index is 40.44 kg/m.  GENERAL: vitals reviewed and listed above, alert, oriented, appears well hydrated and in no acute distress  HEENT: atraumatic, conjunttiva clear, no obvious abnormalities on inspection of external nose and ears, normal appearance of ear canals and TMs except for bilat effusion with discoloration  and bulging of TM R w/out appreciable perforation or OE, clear nasal congestion, mild post oropharyngeal erythema with PND, no tonsillar edema or exudate, no sinus TTP  NECK: no obvious masses on inspection, no TTP, no bruit  LUNGS: clear to auscultation bilaterally, no wheezes, rales or rhonchi, good air movement  CV: HRRR, no peripheral edema  MS: moves all extremities without noticeable abnormality  PSYCH/NEURO: pleasant and cooperative, no obvious depression or anxiety, CN II-XII grossly intact, finger to nose normal, gait normal, speech and thought processing grossly intact  ASSESSMENT AND PLAN:  Discussed the following assessment and plan:  Temporal headache - Plan: MR Brain W Wo Contrast, Ambulatory referral to ENT  Right ear pain - Plan: MR Brain W Wo  Contrast, Ambulatory referral to ENT  Dysfunction of both eustachian tubes - Plan: Ambulatory referral to ENT  Acute otitis media, unspecified otitis media type - Plan: Ambulatory referral to ENT  -we discussed possible serious and likely etiologies, workup and treatment, treatment risks and return precautions - she may be developing a R OM, I do not see exam findings to suggest OE or mastoiditis, I am not sure what is causing all of her symptoms so we will have her start and abx, see ENT and get and MRI before doing OMT. -after this discussion, Julie Fox opted for azithromyc due to allergies (she reports has taken this without issues with her celexa in the past, is aware of risks but prefers this to other options), ENT eval, MRI  -follow up advised as needed in interim pending further eval -of course, we advised Julie Fox  to return or notify a doctor immediately if symptoms worsen or new concerns arise.   Patient Instructions  Take the antibiotic as instructed.  We placed a referral for you as discussed to the Ear specialist and for the MRI. It usually takes about 1-2 weeks to process and schedule this referral. If you have not heard from [place] regarding this appointment in 2 weeks please contact our office.  I hope you are feeling better soon! Seek care immediately if worsening, new concerns or you are not improving with treatment.     Julie Fox R., DO

## 2017-04-26 NOTE — Patient Instructions (Signed)
Take the antibiotic as instructed.  We placed a referral for you as discussed to the Ear specialist and for the MRI. It usually takes about 1-2 weeks to process and schedule this referral. If you have not heard from [place] regarding this appointment in 2 weeks please contact our office.  I hope you are feeling better soon! Seek care immediately if worsening, new concerns or you are not improving with treatment.

## 2017-05-11 ENCOUNTER — Telehealth: Payer: Self-pay | Admitting: Family Medicine

## 2017-05-11 NOTE — Telephone Encounter (Signed)
Error

## 2017-05-16 ENCOUNTER — Ambulatory Visit
Admission: RE | Admit: 2017-05-16 | Discharge: 2017-05-16 | Disposition: A | Discharge status: Home / Self Care | Payer: 59 | Source: Ambulatory Visit | Attending: Family Medicine | Admitting: Family Medicine

## 2017-05-16 DIAGNOSIS — R519 Headache, unspecified: Secondary | ICD-10-CM

## 2017-05-16 DIAGNOSIS — H9201 Otalgia, right ear: Secondary | ICD-10-CM

## 2017-05-16 DIAGNOSIS — R51 Headache: Principal | ICD-10-CM

## 2017-05-16 MED ORDER — GADOBENATE DIMEGLUMINE 529 MG/ML IV SOLN
20.0000 mL | Freq: Once | INTRAVENOUS | Status: AC | PRN
  Administered 2017-05-16: 20 mL via INTRAVENOUS

## 2017-06-11 NOTE — Progress Notes (Signed)
HPI:  Here for CPE: Due for labs, pap, flu vaccine in sept  -Concerns and/or follow up today:  PMH HIV - on Genvoya, managed by ID GAD - takes celexa and trazadone HLD: on lipitor  -Diet: variety of foods, balance and well rounded, larger portion sizes  -Exercise: reports trying to get regular exercise  -Taking folic acid, vitamin D or calcium: no  -Diabetes and Dyslipidemia Screening:fasting for labs  -Hx of HTN: no  -Vaccines: UTD  -pap history: reports was doing every year, hx remotely abnormal, normal every time in last 4 years, reports last pap around 2 years ago  -FDLMP:see nursing notes  -sexual activity: yes, female partner, no new partners  -wants STI testing (Hep C if born 40-65): no  -FH breast, colon or ovarian ca: see FH Last mammogram:n/a Last colon cancer screening: n/a  -Alcohol, Tobacco, drug use: see social history  Review of Systems - no fevers, unintentional weight loss, vision loss, hearing loss, chest pain, sob, hemoptysis, melena, hematochezia, hematuria, genital discharge, changing or concerning skin lesions, bleeding, bruising, loc, thoughts of self harm or SI  Past Medical History:  Diagnosis Date  . GAD (generalized anxiety disorder)   . GERD (gastroesophageal reflux disease)   . History of epilepsy   . HIV (human immunodeficiency virus infection) ([place])   . Hx of abnormal cervical Pap smear    2012 per pt, then normal since   . Migraine   . Obesity   . Pure hypercholesterolemia     Past Surgical History:  Procedure Laterality Date  . CHOLECYSTECTOMY    . TOE SURGERY     age 16-cyst excision per patient  . TONSILLECTOMY  2016    Family History  Problem Relation Age of Onset  . Hypertension Mother   . Hyperlipidemia Mother   . Diabetes Mother   . Cancer Father   . Non-Hodgkin's lymphoma Maternal Grandmother   . Cancer Maternal Grandfather     Social History   Social History  . Marital status: Single    Spouse name:  N/A  . Number of children: N/A  . Years of education: N/A   Social History Main Topics  . Smoking status: Current Every Day Smoker    Packs/day: 0.10    Types: Cigarettes    Start date: 12/24/1995  . Smokeless tobacco: Never Used     Comment: cutting back. chantix did not work.  . Alcohol use None     Comment: once a month  . Drug use: No  . Sexual activity: Not Currently    Partners: Male    Birth control/ protection: Condom     Comment: declined condoms   Other Topics Concern  . None   Social History Narrative   Work or School: Engineer, structural, 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: lives with mother      Spiritual Beliefs:      Lifestyle: no regular exercise, diet not great        Current Outpatient Prescriptions:  .  atorvastatin (LIPITOR) 20 MG tablet, Take 1 tablet (20 mg total) by mouth daily., Disp: 30 tablet, Rfl: 11 .  citalopram (CELEXA) 20 MG tablet, Take 1 tablet (20 mg total) by mouth daily., Disp: 30 tablet, Rfl: 11 .  elvitegravir-cobicistat-emtricitabine-tenofovir (GENVOYA) 150-150-200-10 MG TABS tablet, Take 1 tablet by mouth daily with breakfast., Disp: 30 tablet, Rfl: 5 .  traZODone (DESYREL) 50 MG tablet, Take 1 tablet (50 mg total) by mouth at bedtime., Disp:  30 tablet, Rfl: 11  EXAM:  Vitals:   06/13/17 0824  BP: 102/80  Pulse: 90  Temp: 98.6 F (37 C)   Body mass index is 39.89 kg/m.  GENERAL: vitals reviewed and listed below, alert, oriented, appears well hydrated and in no acute distress  HEENT: head atraumatic, PERRLA, normal appearance of eyes, ears, nose and mouth. moist mucus membranes.  NECK: supple, no masses or lymphadenopathy  LUNGS: clear to auscultation bilaterally, no rales, rhonchi or wheeze  CV: HRRR, no peripheral edema or cyanosis, normal pedal pulses  ABDOMEN: bowel sounds normal, soft, non tender to palpation, no masses, no rebound or guarding  BREAST: normal appearance - no skin lesions or discharge noted on inspection  of both breasts, on palpation of both breast and axillary region no suspicious lesions appreciated today  GU: normal appearance of external genitalia - no lesions or masses appreciated, normal appearing vaginal mucosa - no abnormal discharge, normal appearance of cervix - no lesions or abnormal discharge observed  RECTAL: deferred  SKIN: no rash or abnormal lesions  MS: normal gait, moves all extremities normally  NEURO: normal gait, speech and thought processing grossly intact, muscle tone grossly intact throughout  PSYCH: normal affect, pleasant and cooperative  ASSESSMENT AND PLAN:  Discussed the following assessment and plan:  Encounter for preventive care - Plan: Hemoglobin A1c  BMI 39.0-39.9,adult  Hyperlipidemia, unspecified hyperlipidemia type - Plan: Lipid panel  HIV disease ([place])  GAD (generalized anxiety disorder)   -Discussed and advised all [place] preventive services health task force level A and B recommendations for age, sex and risks.  -Advised at least 150 minutes of exercise per week and a healthy diet with avoidance of (less then 1 serving per week) processed foods, white starches, red meat, fast foods and sweets and consisting of: * 5-9 servings of fresh fruits and vegetables (not corn or potatoes) *nuts and seeds, beans *olives and olive oil *lean meats such as fish and white chicken  *whole grains  -pap obtained  -labs, studies and vaccines per orders this encounter  Orders Placed This Encounter  Procedures  . Lipid panel  . Hemoglobin A1c    Patient advised to return to clinic immediately if symptoms worsen or persist or new concerns.  There are no Patient Instructions on file for this visit.  No Follow-up on file.  Colin Benton R., DO

## 2017-06-13 ENCOUNTER — Ambulatory Visit (INDEPENDENT_AMBULATORY_CARE_PROVIDER_SITE_OTHER): Payer: 59 | Admitting: Family Medicine

## 2017-06-13 ENCOUNTER — Encounter: Payer: Self-pay | Admitting: Family Medicine

## 2017-06-13 ENCOUNTER — Other Ambulatory Visit (HOSPITAL_COMMUNITY)
Admission: RE | Admit: 2017-06-13 | Discharge: 2017-06-13 | Disposition: A | Discharge status: Home / Self Care | Payer: 59 | Source: Ambulatory Visit | Attending: Family Medicine | Admitting: Family Medicine

## 2017-06-13 VITALS — BP 102/80 | HR 90 | Temp 98.6°F | Ht 68.5 in | Wt 266.2 lb

## 2017-06-13 DIAGNOSIS — F411 Generalized anxiety disorder: Secondary | ICD-10-CM | POA: Diagnosis not present

## 2017-06-13 DIAGNOSIS — Z6839 Body mass index (BMI) 39.0-39.9, adult: Secondary | ICD-10-CM | POA: Diagnosis not present

## 2017-06-13 DIAGNOSIS — B20 Human immunodeficiency virus [HIV] disease: Secondary | ICD-10-CM

## 2017-06-13 DIAGNOSIS — E785 Hyperlipidemia, unspecified: Secondary | ICD-10-CM | POA: Diagnosis not present

## 2017-06-13 DIAGNOSIS — Z0000 Encounter for general adult medical examination without abnormal findings: Secondary | ICD-10-CM

## 2017-06-13 DIAGNOSIS — Z124 Encounter for screening for malignant neoplasm of cervix: Secondary | ICD-10-CM | POA: Diagnosis not present

## 2017-06-13 LAB — LIPID PANEL
CHOL/HDL RATIO: 3
Cholesterol: 139 mg/dL (ref 0–200)
HDL: 40.4 mg/dL (ref >39.00)
LDL Cholesterol: 61 mg/dL (ref 0–99)
NonHDL: 98.74
TRIGLYCERIDES: 188 mg/dL — AB (ref 0.0–149.0)
VLDL: 37.6 mg/dL (ref 0.0–40.0)

## 2017-06-13 LAB — HEMOGLOBIN A1C: Hgb A1c MFr Bld: 5.7 % (ref 4.6–6.5)

## 2017-06-13 NOTE — Patient Instructions (Signed)
BEFORE YOU LEAVE: -labs -set up flu shot visit in sept or oct -follow up: 4-6 months  We have ordered labs and a pap smear at this visit. It can take up to 1-2 weeks for results and processing. IF results require follow up or explanation, we will call you with instructions. Clinically stable results will be released to your [facility]. If you have not heard from [place] or cannot find your results in [facility] in 2 weeks please contact our office at [phone].  If you are not yet signed up for [facility], please consider signing up.  Health Maintenance, Female Adopting a healthy lifestyle and getting preventive care can go a long way to promote health and wellness. Talk with your health care provider about what schedule of regular examinations is right for you. This is a good chance for you to check in with your provider about disease prevention and staying healthy. In between checkups, there are plenty of things you can do on your own. Experts have done a lot of research about which lifestyle changes and preventive measures are most likely to keep you healthy. Ask your health care provider for more information. Weight and diet Eat a healthy diet  Be sure to include plenty of vegetables, fruits, low-fat dairy products, and lean protein.  Do not eat a lot of foods high in solid fats, added sugars, or salt.  Get regular exercise. This is one of the most important things you can do for your health. ? Most adults should exercise for at least 150 minutes each week. The exercise should increase your heart rate and make you sweat (moderate-intensity exercise). ? Most adults should also do strengthening exercises at least twice a week. This is in addition to the moderate-intensity exercise.  Maintain a healthy weight  Body mass index (BMI) is a measurement that can be used to identify possible weight problems. It estimates body fat based on height and weight. Your health care provider can help determine your  BMI and help you achieve or maintain a healthy weight.  For females 40 years of age and older: ? A BMI below 18.5 is considered underweight. ? A BMI of 18.5 to 24.9 is normal. ? A BMI of 25 to 29.9 is considered overweight. ? A BMI of 30 and above is considered obese.  Watch levels of cholesterol and blood lipids  You should start having your blood tested for lipids and cholesterol at 40 years of age, then have this test every 5 years.  You may need to have your cholesterol levels checked more often if: ? Your lipid or cholesterol levels are high. ? You are older than 40 years of age. ? You are at high risk for heart disease.  Cancer screening Lung Cancer  Lung cancer screening is recommended for adults 50-34 years old who are at high risk for lung cancer because of a history of smoking.  A yearly low-dose CT scan of the lungs is recommended for people who: ? Currently smoke. ? Have quit within the past 15 years. ? Have at least a 30-pack-year history of smoking. A pack year is smoking an average of one pack of cigarettes a day for 1 year.  Yearly screening should continue until it has been 15 years since you quit.  Yearly screening should stop if you develop a health problem that would prevent you from having lung cancer treatment.  Breast Cancer  Practice breast self-awareness. This means understanding how your breasts normally appear and feel.  It also means doing regular breast self-exams. Let your health care provider know about any changes, no matter how small.  If you are in your 20s or 30s, you should have a clinical breast exam (CBE) by a health care provider every 1-3 years as part of a regular health exam.  If you are 77 or older, have a CBE every year. Also consider having a breast X-ray (mammogram) every year.  If you have a family history of breast cancer, talk to your health care provider about genetic screening.  If you are at high risk for breast cancer,  talk to your health care provider about having an MRI and a mammogram every year.  Breast cancer gene (BRCA) assessment is recommended for women who have family members with BRCA-related cancers. BRCA-related cancers include: ? Breast. ? Ovarian. ? Tubal. ? Peritoneal cancers.  Results of the assessment will determine the need for genetic counseling and BRCA1 and BRCA2 testing.  Cervical Cancer Your health care provider may recommend that you be screened regularly for cancer of the pelvic organs (ovaries, uterus, and vagina). This screening involves a pelvic examination, including checking for microscopic changes to the surface of your cervix (Pap test). You may be encouraged to have this screening done every 3 years, beginning at age 97.  For women ages 32-65, health care providers may recommend pelvic exams and Pap testing every 3 years, or they may recommend the Pap and pelvic exam, combined with testing for human papilloma virus (HPV), every 5 years. Some types of HPV increase your risk of cervical cancer. Testing for HPV may also be done on women of any age with unclear Pap test results.  Other health care providers may not recommend any screening for nonpregnant women who are considered low risk for pelvic cancer and who do not have symptoms. Ask your health care provider if a screening pelvic exam is right for you.  If you have had past treatment for cervical cancer or a condition that could lead to cancer, you need Pap tests and screening for cancer for at least 20 years after your treatment. If Pap tests have been discontinued, your risk factors (such as having a new sexual partner) need to be reassessed to determine if screening should resume. Some women have medical problems that increase the chance of getting cervical cancer. In these cases, your health care provider may recommend more frequent screening and Pap tests.  Colorectal Cancer  This type of cancer can be detected and often  prevented.  Routine colorectal cancer screening usually begins at 40 years of age and continues through 40 years of age.  Your health care provider may recommend screening at an earlier age if you have risk factors for colon cancer.  Your health care provider may also recommend using home test kits to check for hidden blood in the stool.  A small camera at the end of a tube can be used to examine your colon directly (sigmoidoscopy or colonoscopy). This is done to check for the earliest forms of colorectal cancer.  Routine screening usually begins at age 1.  Direct examination of the colon should be repeated every 5-10 years through 40 years of age. However, you may need to be screened more often if early forms of precancerous polyps or small growths are found.  Skin Cancer  Check your skin from head to toe regularly.  Tell your health care provider about any new moles or changes in moles, especially if there is a  change in a mole's shape or color.  Also tell your health care provider if you have a mole that is larger than the size of a pencil eraser.  Always use sunscreen. Apply sunscreen liberally and repeatedly throughout the day.  Protect yourself by wearing long sleeves, pants, a wide-brimmed hat, and sunglasses whenever you are outside.  Heart disease, diabetes, and high blood pressure  High blood pressure causes heart disease and increases the risk of stroke. High blood pressure is more likely to develop in: ? People who have blood pressure in the high end of the normal range (130-139/85-89 mm Hg). ? People who are overweight or obese. ? People who are African American.  If you are 79-13 years of age, have your blood pressure checked every 3-5 years. If you are 37 years of age or older, have your blood pressure checked every year. You should have your blood pressure measured twice-once when you are at a hospital or clinic, and once when you are not at a hospital or clinic.  Record the average of the two measurements. To check your blood pressure when you are not at a hospital or clinic, you can use: ? An automated blood pressure machine at a pharmacy. ? A home blood pressure monitor.  If you are between 71 years and 51 years old, ask your health care provider if you should take aspirin to prevent strokes.  Have regular diabetes screenings. This involves taking a blood sample to check your fasting blood sugar level. ? If you are at a normal weight and have a low risk for diabetes, have this test once every three years after 40 years of age. ? If you are overweight and have a high risk for diabetes, consider being tested at a younger age or more often. Preventing infection Hepatitis B  If you have a higher risk for hepatitis B, you should be screened for this virus. You are considered at high risk for hepatitis B if: ? You were born in a country where hepatitis B is common. Ask your health care provider which countries are considered high risk. ? Your parents were born in a high-risk country, and you have not been immunized against hepatitis B (hepatitis B vaccine). ? You have HIV or AIDS. ? You use needles to inject street drugs. ? You live with someone who has hepatitis B. ? You have had sex with someone who has hepatitis B. ? You get hemodialysis treatment. ? You take certain medicines for conditions, including cancer, organ transplantation, and autoimmune conditions.  Hepatitis C  Blood testing is recommended for: ? Everyone born from [birth year] through 1964. ? Anyone with known risk factors for hepatitis C.  Sexually transmitted infections (STIs)  You should be screened for sexually transmitted infections (STIs) including gonorrhea and chlamydia if: ? You are sexually active and are younger than 40 years of age. ? You are older than 40 years of age and your health care provider tells you that you are at risk for this type of infection. ? Your sexual  activity has changed since you were last screened and you are at an increased risk for chlamydia or gonorrhea. Ask your health care provider if you are at risk.  If you do not have HIV, but are at risk, it may be recommended that you take a prescription medicine daily to prevent HIV infection. This is called pre-exposure prophylaxis (PrEP). You are considered at risk if: ? You are sexually active and do not regularly  use condoms or know the HIV status of your partner(s). ? You take drugs by injection. ? You are sexually active with a partner who has HIV.  Talk with your health care provider about whether you are at high risk of being infected with HIV. If you choose to begin PrEP, you should first be tested for HIV. You should then be tested every 3 months for as long as you are taking PrEP. Pregnancy  If you are premenopausal and you may become pregnant, ask your health care provider about preconception counseling.  If you may become pregnant, take 400 to 800 micrograms (mcg) of folic acid every day.  If you want to prevent pregnancy, talk to your health care provider about birth control (contraception). Osteoporosis and menopause  Osteoporosis is a disease in which the bones lose minerals and strength with aging. This can result in serious bone fractures. Your risk for osteoporosis can be identified using a bone density scan.  If you are 26 years of age or older, or if you are at risk for osteoporosis and fractures, ask your health care provider if you should be screened.  Ask your health care provider whether you should take a calcium or vitamin D supplement to lower your risk for osteoporosis.  Menopause may have certain physical symptoms and risks.  Hormone replacement therapy may reduce some of these symptoms and risks. Talk to your health care provider about whether hormone replacement therapy is right for you. Follow these instructions at home:  Schedule regular health, dental,  and eye exams.  Stay current with your immunizations.  Do not use any tobacco products including cigarettes, chewing tobacco, or electronic cigarettes.  If you are pregnant, do not drink alcohol.  If you are breastfeeding, limit how much and how often you drink alcohol.  Limit alcohol intake to no more than 1 drink per day for nonpregnant women. One drink equals 12 ounces of beer, 5 ounces of wine, or 1 ounces of hard liquor.  Do not use street drugs.  Do not share needles.  Ask your health care provider for help if you need support or information about quitting drugs.  Tell your health care provider if you often feel depressed.  Tell your health care provider if you have ever been abused or do not feel safe at home. This information is not intended to replace advice given to you by your health care provider. Make sure you discuss any questions you have with your health care provider. Document Released: 04/26/2011 Document Revised: 03/18/2016 Document Reviewed: 07/15/2015 Elsevier Interactive Patient Education  [employer].

## 2017-06-13 NOTE — Addendum Note (Signed)
Addended by: Agnes Lawrence on: 06/13/2017 09:04 AM   Modules accepted: Orders

## 2017-06-14 LAB — CYTOLOGY - PAP
DIAGNOSIS: NEGATIVE
HPV (WINDOPATH): NOT DETECTED

## 2017-07-05 ENCOUNTER — Encounter: Payer: Self-pay | Admitting: Family Medicine

## 2017-07-05 ENCOUNTER — Ambulatory Visit (INDEPENDENT_AMBULATORY_CARE_PROVIDER_SITE_OTHER): Payer: 59 | Admitting: Family Medicine

## 2017-07-05 VITALS — BP 112/80 | HR 77 | Temp 98.3°F | Ht 68.5 in

## 2017-07-05 DIAGNOSIS — J0100 Acute maxillary sinusitis, unspecified: Secondary | ICD-10-CM | POA: Diagnosis not present

## 2017-07-05 MED ORDER — BENZONATATE 100 MG PO CAPS: 100.0000 mg | ORAL_CAPSULE | Freq: Three times a day (TID) | ORAL | 0 refills | Status: DC | PRN

## 2017-07-05 MED ORDER — DOXYCYCLINE HYCLATE 100 MG PO TABS: 100.0000 mg | ORAL_TABLET | Freq: Two times a day (BID) | ORAL | 0 refills | Status: DC

## 2017-07-05 NOTE — Progress Notes (Signed)
HPI:  Acute visit for cough and sinus congestion: -started: 2 weeks ago with fever for 3 days, sinus congestion and cough -symptoms: now has persistent sinus congestion, cough, thick green sputum and sinus pressure/pain and R ear fullness -denies:persistent fever, SOB, NVD, tooth pain -has tried: musinex, allergy medications -sick contacts/travel/risks: no reported flu, strep or tick exposure -HIV well controlled per most recent ID notes  ROS: See pertinent positives and negatives per HPI.  Past Medical History:  Diagnosis Date  . GAD (generalized anxiety disorder)   . GERD (gastroesophageal reflux disease)   . History of epilepsy   . HIV (human immunodeficiency virus infection) ([place])   . Hx of abnormal cervical Pap smear    2012 per pt, then normal since   . Migraine   . Obesity   . Pure hypercholesterolemia     Past Surgical History:  Procedure Laterality Date  . CHOLECYSTECTOMY    . TOE SURGERY     age 29-cyst excision per patient  . TONSILLECTOMY  2016    Family History  Problem Relation Age of Onset  . Hypertension Mother   . Hyperlipidemia Mother   . Diabetes Mother   . Cancer Father   . Non-Hodgkin's lymphoma Maternal Grandmother   . Cancer Maternal Grandfather     Social History   Social History  . Marital status: Single    Spouse name: N/A  . Number of children: N/A  . Years of education: N/A   Social History Main Topics  . Smoking status: Current Every Day Smoker    Packs/day: 0.10    Types: Cigarettes    Start date: 12/24/1995  . Smokeless tobacco: Never Used     Comment: cutting back. chantix did not work.  . Alcohol use None     Comment: once a month  . Drug use: No  . Sexual activity: Not Currently    Partners: Male    Birth control/ protection: Condom     Comment: declined condoms   Other Topics Concern  . None   Social History Narrative   Work or School: Engineer, structural, 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: lives with mother      Spiritual Beliefs:      Lifestyle: no regular exercise, diet not great        Current Outpatient Prescriptions:  .  atorvastatin (LIPITOR) 20 MG tablet, Take 1 tablet (20 mg total) by mouth daily., Disp: 30 tablet, Rfl: 11 .  citalopram (CELEXA) 20 MG tablet, Take 1 tablet (20 mg total) by mouth daily., Disp: 30 tablet, Rfl: 11 .  elvitegravir-cobicistat-emtricitabine-tenofovir (GENVOYA) 150-150-200-10 MG TABS tablet, Take 1 tablet by mouth daily with breakfast., Disp: 30 tablet, Rfl: 5 .  traZODone (DESYREL) 50 MG tablet, Take 1 tablet (50 mg total) by mouth at bedtime., Disp: 30 tablet, Rfl: 11 .  benzonatate (TESSALON PERLES) 100 MG capsule, Take 1 capsule (100 mg total) by mouth 3 (three) times daily as needed., Disp: 20 capsule, Rfl: 0 .  doxycycline (VIBRA-TABS) 100 MG tablet, Take 1 tablet (100 mg total) by mouth 2 (two) times daily., Disp: 20 tablet, Rfl: 0  EXAM:  Vitals:   07/05/17 1601  BP: 112/80  Pulse: 77  Temp: 98.3 F (36.8 C)  SpO2: 97%    There is no height or weight on file to calculate BMI.  GENERAL: vitals reviewed and listed above, alert, oriented, appears well hydrated and in no acute distress  HEENT: atraumatic, conjunttiva clear,  no obvious abnormalities on inspection of external nose and ears, normal appearance of ear canals and TMs - bilat effusions R less clear the L, no erythema or bulging/retraction TM, thick nasal congestion R>L, mild post oropharyngeal erythema with PND, no tonsillar edema or exudate, no sinus TTP  NECK: no obvious masses on inspection  LUNGS: clear to auscultation bilaterally, no wheezes, rales or rhonchi, good air movement  CV: HRRR, no peripheral edema  MS: moves all extremities without noticeable abnormality  PSYCH: pleasant and cooperative, no obvious depression or anxiety  ASSESSMENT AND PLAN:  Discussed the following assessment and plan:  Acute non-recurrent maxillary sinusitis  -given HPI and exam findings today,  a serious infection or illness is unlikely. We discussed potential etiologies, with viral URI --> sinusitis being most likely, and treating with abx and tessalon. We discussed treatment side effects, likely course, return precautions/ansmission, and signs of developing a serious illness. -of course, we advised to return or notify a doctor immediately if symptoms worsen or persist or new concerns arise.    Patient Instructions  Start the antibiotic (doxycycline) - complete per instructions.  Tessalon as needed for cough.  I hope you are feeling better soon! Seek care immediately if worsening, new concerns or you are not improving with treatment.      Colin Benton R., DO

## 2017-07-05 NOTE — Patient Instructions (Signed)
Start the antibiotic (doxycycline) - complete per instructions.  Tessalon as needed for cough.  I hope you are feeling better soon! Seek care immediately if worsening, new concerns or you are not improving with treatment.

## 2017-07-19 ENCOUNTER — Other Ambulatory Visit: Payer: Self-pay | Admitting: Internal Medicine

## 2017-07-19 DIAGNOSIS — B20 Human immunodeficiency virus [HIV] disease: Secondary | ICD-10-CM

## 2017-07-27 ENCOUNTER — Ambulatory Visit: Payer: 59

## 2017-07-28 ENCOUNTER — Ambulatory Visit (INDEPENDENT_AMBULATORY_CARE_PROVIDER_SITE_OTHER): Payer: 59

## 2017-07-28 DIAGNOSIS — Z23 Encounter for immunization: Secondary | ICD-10-CM | POA: Diagnosis not present

## 2017-08-30 ENCOUNTER — Ambulatory Visit: Payer: Commercial Managed Care - HMO | Admitting: Internal Medicine

## 2017-08-30 VITALS — BP 136/80 | HR 106 | Temp 99.1°F | Wt 273.0 lb

## 2017-08-30 DIAGNOSIS — F419 Anxiety disorder, unspecified: Secondary | ICD-10-CM

## 2017-08-30 DIAGNOSIS — B20 Human immunodeficiency virus [HIV] disease: Secondary | ICD-10-CM | POA: Diagnosis not present

## 2017-08-30 MED ORDER — CITALOPRAM HYDROBROMIDE 40 MG PO TABS: 40.0000 mg | ORAL_TABLET | Freq: Every day | ORAL | 11 refills | Status: DC

## 2017-08-30 NOTE — Patient Instructions (Signed)
Make appt for getting pneumonia vaccine in January 2019  We will see you back in 6 months

## 2017-08-30 NOTE — Progress Notes (Signed)
RFV: follow up for hiv disease  Patient ID: Julie Fox, female   DOB: [birth year], 41 y.o.   MRN: [id number]  HPI 41yo F with well controlled hiv disease, CD 4 count 1730/VL<20 on genvoya   + anxiety - wants to increase celexa to 40mg  daily  Had pap, ekg, lipids all normal Also, had flu shot. Needs pneumonia vaccine in jna 2019  Outpatient Encounter Medications as of 08/30/2017  Medication Sig  . atorvastatin (LIPITOR) 20 MG tablet Take 1 tablet (20 mg total) by mouth daily.  . benzonatate (TESSALON PERLES) 100 MG capsule Take 1 capsule (100 mg total) by mouth 3 (three) times daily as needed.  . citalopram (CELEXA) 20 MG tablet Take 1 tablet (20 mg total) by mouth daily.  Marland Kitchen doxycycline (VIBRA-TABS) 100 MG tablet Take 1 tablet (100 mg total) by mouth 2 (two) times daily.  . GENVOYA 150-150-200-10 MG TABS tablet TAKE 1 TABLET BY MOUTH DAILY WITH BREAKFAST  . traZODone (DESYREL) 50 MG tablet Take 1 tablet (50 mg total) by mouth at bedtime.   No facility-administered encounter medications on file as of 08/30/2017.      Patient Active Problem List   Diagnosis Date Noted  . GAD (generalized anxiety disorder) 06/13/2017  . Hyperlipidemia 06/13/2017  . HIV disease ([place]) 12/23/2016     There are no preventive care reminders to display for this patient.   Review of Systems Review of Systems  Constitutional: Negative for fever, chills, diaphoresis, activity change, appetite change, fatigue and unexpected weight change.  HENT: Negative for congestion, sore throat, rhinorrhea, sneezing, trouble swallowing and sinus pressure.  Eyes: Negative for photophobia and visual disturbance.  Respiratory: Negative for cough, chest tightness, shortness of breath, wheezing and stridor.  Cardiovascular: Negative for chest pain, palpitations and leg swelling.  Gastrointestinal: Negative for nausea, vomiting, abdominal pain, diarrhea, constipation, blood in stool, abdominal distention and anal  bleeding.  Genitourinary: Negative for dysuria, hematuria, flank pain and difficulty urinating.  Musculoskeletal: Negative for myalgias, back pain, joint swelling, arthralgias and gait problem.  Skin: Negative for color change, pallor, rash and wound.  Neurological: Negative for dizziness, tremors, weakness and light-headedness.  Hematological: Negative for adenopathy. Does not bruise/bleed easily.  Psychiatric/Behavioral: + anxiety Physical Exam   BP 136/80   Pulse (!) 106   Temp 99.1 F (37.3 C) (Oral)   Wt 273 lb (123.8 kg)   LMP 08/29/2017   BMI 40.91 kg/m  Physical Exam  Constitutional:  oriented to person, place, and time. appears well-developed and well-nourished. No distress.  HENT: [place]/AT, PERRLA, no scleral icterus Mouth/Throat: Oropharynx is clear and moist. No oropharyngeal exudate.  Cardiovascular: Normal rate, regular rhythm and normal heart sounds. Exam reveals no gallop and no friction rub.  No murmur heard.  Pulmonary/Chest: Effort normal and breath sounds normal. No respiratory distress.  has no wheezes.  Neck = supple, no nuchal rigidity Lymphadenopathy: no cervical adenopathy. No axillary adenopathy Neurological: alert and oriented to person, place, and time.  Skin: Skin is warm and dry. No rash noted. No erythema.  Psychiatric: a normal mood and affect.  behavior is normal.    Lab Results  Component Value Date   CD4TCELL 55 02/23/2017   Lab Results  Component Value Date   CD4TABS 1,730 02/23/2017   CD4TABS 1,270 09/27/2016   Lab Results  Component Value Date   HIV1RNAQUANT <20 NOT DETECTED 02/23/2017   Lab Results  Component Value Date   HEPBSAB POS (A) 09/27/2016   Lab  Results  Component Value Date   LABRPR NON REAC 09/27/2016    CBC Lab Results  Component Value Date   WBC 8.2 02/23/2017   RBC 4.13 02/23/2017   HGB 13.4 02/23/2017   HCT 39.1 02/23/2017   PLT 308 02/23/2017   MCV 94.7 02/23/2017   MCH 32.4 02/23/2017   MCHC 34.3  02/23/2017   RDW 13.7 02/23/2017   LYMPHSABS 3,034 02/23/2017   MONOABS 492 02/23/2017   EOSABS 164 02/23/2017    BMET Lab Results  Component Value Date   NA 138 02/23/2017   K 4.1 02/23/2017   CL 105 02/23/2017   CO2 23 02/23/2017   GLUCOSE 100 (H) 02/23/2017   BUN 10 02/23/2017   CREATININE 0.89 02/23/2017   CALCIUM 8.9 02/23/2017   GFRNONAA 82 02/23/2017   GFRAA >89 02/23/2017      Assessment and Plan hiv disease = continue on current regimen but also plan to get labs today  Health maintenance - pneumococcal vaccine is due in [place] sometime in 2019  She is working  smoking cessation  Anxiety - increase to 40mg  dialy

## 2017-08-31 LAB — CBC WITH DIFFERENTIAL/PLATELET
Basophils Absolute: 48 cells/uL (ref 0–200)
Basophils Relative: 0.8 %
EOS PCT: 1.8 %
Eosinophils Absolute: 108 cells/uL (ref 15–500)
HEMATOCRIT: 36.8 % (ref 35.0–45.0)
Hemoglobin: 12.6 g/dL (ref 11.7–15.5)
LYMPHS ABS: 1842 {cells}/uL (ref 850–3900)
MCH: 32.4 pg (ref 27.0–33.0)
MCHC: 34.2 g/dL (ref 32.0–36.0)
MCV: 94.6 fL (ref 80.0–100.0)
MONOS PCT: 5.6 %
MPV: 11.3 fL (ref 7.5–12.5)
NEUTROS ABS: 3666 {cells}/uL (ref 1500–7800)
NEUTROS PCT: 61.1 %
Platelets: 291 10*3/uL (ref 140–400)
RBC: 3.89 10*6/uL (ref 3.80–5.10)
RDW: 12.1 % (ref 11.0–15.0)
Total Lymphocyte: 30.7 %
WBC mixed population: 336 cells/uL (ref 200–950)
WBC: 6 10*3/uL (ref 3.8–10.8)

## 2017-08-31 LAB — SYPHILIS: RPR W/REFLEX TO RPR TITER AND TREPONEMAL ANTIBODIES, TRADITIONAL SCREENING AND DIAGNOSIS ALGORITHM: RPR Ser Ql: NONREACTIVE

## 2017-08-31 LAB — COMPLETE METABOLIC PANEL WITH GFR
AG Ratio: 1.4 (calc) (ref 1.0–2.5)
ALKALINE PHOSPHATASE (APISO): 92 U/L (ref 33–115)
ALT: 28 U/L (ref 6–29)
AST: 19 U/L (ref 10–30)
Albumin: 4 g/dL (ref 3.6–5.1)
BUN: 11 mg/dL (ref 7–25)
CALCIUM: 9.4 mg/dL (ref 8.6–10.2)
CO2: 27 mmol/L (ref 20–32)
CREATININE: 1 mg/dL (ref 0.50–1.10)
Chloride: 103 mmol/L (ref 98–110)
GFR, EST NON AFRICAN AMERICAN: 70 mL/min/{1.73_m2} (ref >=60)
GFR, Est African American: 82 mL/min/{1.73_m2} (ref >=60)
GLOBULIN: 2.8 g/dL (ref 1.9–3.7)
GLUCOSE: 146 mg/dL — AB (ref 65–99)
Potassium: 4 mmol/L (ref 3.5–5.3)
Sodium: 138 mmol/L (ref 135–146)
Total Bilirubin: 0.3 mg/dL (ref 0.2–1.2)
Total Protein: 6.8 g/dL (ref 6.1–8.1)

## 2017-08-31 LAB — T-HELPER CELL (CD4) - (RCID CLINIC ONLY)
CD4 % Helper T Cell: 52 % (ref 33–55)
CD4 T Cell Abs: 1210 /uL (ref 400–2700)

## 2017-09-01 LAB — HIV-1 RNA QUANT-NO REFLEX-BLD
HIV 1 RNA QUANT: NOT DETECTED {copies}/mL
HIV-1 RNA Quant, Log: <1.3 Log copies/mL

## 2017-09-27 ENCOUNTER — Encounter (HOSPITAL_COMMUNITY): Payer: Self-pay | Admitting: *Deleted

## 2017-09-27 ENCOUNTER — Ambulatory Visit: Payer: Self-pay

## 2017-09-27 ENCOUNTER — Other Ambulatory Visit: Payer: Self-pay

## 2017-09-27 ENCOUNTER — Inpatient Hospital Stay (HOSPITAL_COMMUNITY)
Admission: AD | Admit: 2017-09-27 | Discharge: 2017-09-27 | Disposition: A | Discharge status: Home / Self Care | Payer: 59 | Source: Ambulatory Visit | Attending: Obstetrics and Gynecology | Admitting: Obstetrics and Gynecology

## 2017-09-27 DIAGNOSIS — Z9889 Other specified postprocedural states: Secondary | ICD-10-CM | POA: Diagnosis not present

## 2017-09-27 DIAGNOSIS — Z79899 Other long term (current) drug therapy: Secondary | ICD-10-CM | POA: Diagnosis not present

## 2017-09-27 DIAGNOSIS — Z3202 Encounter for pregnancy test, result negative: Secondary | ICD-10-CM | POA: Insufficient documentation

## 2017-09-27 DIAGNOSIS — Z8249 Family history of ischemic heart disease and other diseases of the circulatory system: Secondary | ICD-10-CM | POA: Diagnosis not present

## 2017-09-27 DIAGNOSIS — E669 Obesity, unspecified: Secondary | ICD-10-CM | POA: Diagnosis not present

## 2017-09-27 DIAGNOSIS — Z809 Family history of malignant neoplasm, unspecified: Secondary | ICD-10-CM | POA: Insufficient documentation

## 2017-09-27 DIAGNOSIS — Z807 Family history of other malignant neoplasms of lymphoid, hematopoietic and related tissues: Secondary | ICD-10-CM | POA: Insufficient documentation

## 2017-09-27 DIAGNOSIS — F17210 Nicotine dependence, cigarettes, uncomplicated: Secondary | ICD-10-CM | POA: Diagnosis not present

## 2017-09-27 DIAGNOSIS — Z9049 Acquired absence of other specified parts of digestive tract: Secondary | ICD-10-CM | POA: Diagnosis not present

## 2017-09-27 DIAGNOSIS — Z880 Allergy status to penicillin: Secondary | ICD-10-CM | POA: Diagnosis not present

## 2017-09-27 DIAGNOSIS — E7800 Pure hypercholesterolemia, unspecified: Secondary | ICD-10-CM | POA: Insufficient documentation

## 2017-09-27 DIAGNOSIS — B20 Human immunodeficiency virus [HIV] disease: Secondary | ICD-10-CM | POA: Insufficient documentation

## 2017-09-27 DIAGNOSIS — Z6841 Body Mass Index (BMI) 40.0 and over, adult: Secondary | ICD-10-CM | POA: Diagnosis not present

## 2017-09-27 DIAGNOSIS — N939 Abnormal uterine and vaginal bleeding, unspecified: Secondary | ICD-10-CM | POA: Insufficient documentation

## 2017-09-27 DIAGNOSIS — F419 Anxiety disorder, unspecified: Secondary | ICD-10-CM | POA: Insufficient documentation

## 2017-09-27 DIAGNOSIS — Z882 Allergy status to sulfonamides status: Secondary | ICD-10-CM | POA: Insufficient documentation

## 2017-09-27 DIAGNOSIS — G40909 Epilepsy, unspecified, not intractable, without status epilepticus: Secondary | ICD-10-CM | POA: Diagnosis not present

## 2017-09-27 DIAGNOSIS — Z833 Family history of diabetes mellitus: Secondary | ICD-10-CM | POA: Insufficient documentation

## 2017-09-27 LAB — CBC
HCT: 38.3 % (ref 36.0–46.0)
Hemoglobin: 12.4 g/dL (ref 12.0–15.0)
MCH: 31.9 pg (ref 26.0–34.0)
MCHC: 32.4 g/dL (ref 30.0–36.0)
MCV: 98.5 fL (ref 78.0–100.0)
PLATELETS: 312 10*3/uL (ref 150–400)
RBC: 3.89 MIL/uL (ref 3.87–5.11)
RDW: 13.2 % (ref 11.5–15.5)
WBC: 7.9 10*3/uL (ref 4.0–10.5)

## 2017-09-27 LAB — POCT PREGNANCY, URINE: Preg Test, Ur: NEGATIVE

## 2017-09-27 LAB — WET PREP, GENITAL
Sperm: NONE SEEN
Trich, Wet Prep: NONE SEEN
YEAST WET PREP: NONE SEEN

## 2017-09-27 LAB — URINALYSIS, ROUTINE W REFLEX MICROSCOPIC
Bilirubin Urine: NEGATIVE
Glucose, UA: NEGATIVE mg/dL
KETONES UR: NEGATIVE mg/dL
Leukocytes, UA: NEGATIVE
Nitrite: NEGATIVE
PH: 7 (ref 5.0–8.0)
PROTEIN: NEGATIVE mg/dL
Specific Gravity, Urine: 1.015 (ref 1.005–1.030)

## 2017-09-27 MED ORDER — MEGESTROL ACETATE 40 MG PO TABS: 40.0000 mg | ORAL_TABLET | Freq: Two times a day (BID) | ORAL | 0 refills | Status: DC

## 2017-09-27 MED ORDER — MEGESTROL ACETATE 40 MG PO TABS: 40.0000 mg | ORAL_TABLET | Freq: Two times a day (BID) | ORAL | 3 refills | Status: DC

## 2017-09-27 NOTE — Discharge Instructions (Signed)
In late 2019, the [facility] will be moving to the [place]. At that time, the MAU (Maternity Admissions Unit), where you are being seen today, will no longer see non-pregnant patients. We strongly encourage you to find a doctor's office before that time, so that you can be seen with any GYN concerns, like vaginal discharge, urinary tract infection, etc.. in a timely manner.   In order to make the office visit more convenient, the [facility] [place] [facility] will be offering evening hours from 4pm-7:30pm on Monday. There will be same-day appointments, walk-in appointments and scheduled appointments available during this time. We will be adding more evening hours over the next year before the move.   [facility] [place] [facility] [phone]  For urgent needs, [facility] is also available for management of urgent GYN complaints such as vaginal discharge or urinary tract infections.     Abnormal Uterine Bleeding Abnormal uterine bleeding can affect women at various stages in life, including teenagers, women in their reproductive years, pregnant women, and women who have reached menopause. Several kinds of uterine bleeding are considered abnormal, including:  Bleeding or spotting between periods.  Bleeding after sexual intercourse.  Bleeding that is heavier or more than normal.  Periods that last longer than usual.  Bleeding after menopause.  Many cases of abnormal uterine bleeding are minor and simple to treat, while others are more serious. Any type of abnormal bleeding should be evaluated by your health care provider. Treatment will depend on the cause of the bleeding. Follow these instructions at home: Monitor your condition for any changes. The following actions may help to alleviate any discomfort you are experiencing:  Avoid the use of tampons and douches as directed by your health care provider.  Change your pads  frequently.  You should get regular pelvic exams and Pap tests. Keep all follow-up appointments for diagnostic tests as directed by your health care provider. Contact a health care provider if:  Your bleeding lasts more than 1 week.  You feel dizzy at times. Get help right away if:  You pass out.  You are changing pads every 15 to 30 minutes.  You have abdominal pain.  You have a fever.  You become sweaty or weak.  You are passing large blood clots from the vagina.  You start to feel nauseous and vomit. This information is not intended to replace advice given to you by your health care provider. Make sure you discuss any questions you have with your health care provider. Document Released: 10/11/2005 Document Revised: 03/24/2016 Document Reviewed: 05/10/2013 Elsevier Interactive Patient Education  2017 [employer].

## 2017-09-27 NOTE — MAU Note (Addendum)
Been bleeding for past 30 days.  Excessively heavy last 2 days.  Pass golf ball sized clots, 2-3 a day for the past wk. Uncomfortable at times, but not pain.  No prior hx of bleeding like this.  Has been tired and sleepy, but denies dizziness

## 2017-09-27 NOTE — Telephone Encounter (Signed)
Pt called with c/o vaginal bleeding for the past month. Pt states that she is bleeding through a super tampon and pad every 15-20 minutes. States she is now passing 2-3 golf ball sized clots per day. Denies any cramping but is having pain 1-2/10 upper abdomen upper ribs. Per protocol advised pt to go to the nearest ED. Based on sx asked pt if I could call the ED (maternity admissions) at [facility] to see based on sx. [facility] to [place] [facility] and per charge nurse pt needs to come there now.   Reason for Disposition . SEVERE vaginal bleeding (i.e., soaking 2 pads or tampons per hour and present 2 or more hours)  Answer Assessment - Initial Assessment Questions 1. AMOUNT: "Describe the bleeding that you are having."    - SPOTTING: spotting, or pinkish / brownish mucous discharge; does not fill panti-liner or pad    - MILD:  less than 1 pad / hour; less than patient's usual menstrual bleeding   - MODERATE: 1-2 pads / hour; small-medium blood clots (e.g., pea, grape, small coin)    - SEVERE: soaking 2 or more pads/hour for 2 or more hours; bleeding not contained by pads or continuous red blood from vagina; large blood clots (e.g., golf ball, large coin)      Severe going through a super tampon and a pad in 15 - 20 minutes 2. ONSET: "When did the bleeding begin?" "Is it continuing now?"     1 month ago Nov 5th Its continuing now  3. MENSTRUAL PERIOD: "When was the last normal menstrual period?" "How is this different than your period?"     In October. This is different because pt has bled continuously for a past month 4. REGULARITY: "How regular are your periods?"     "fairly regular" Every 3.5 to 4 weeks and last 5-7 days 5. ABDOMINAL PAIN: "Do you have any pain?" "How bad is the pain?"  (e.g., Scale 1-10; mild, moderate, or severe)   - MILD (1-3): doesn't interfere with normal activities, abdomen soft and not tender to touch    - MODERATE (4-7): interferes with normal activities or  awakens from sleep, tender to touch    - SEVERE (8-10): excruciating pain, doubled over, unable to do any normal activities      1-2/10 top of abdomen under your ribs 6. PREGNANCY: "Could you be pregnant?" "Are you sexually active?" "Did you recently give birth?"     No. Not sexually active. Didn't recently give birth 43. BREASTFEEDING: "Are you breastfeeding?"     n/a 8. HORMONES: "Are you taking any hormone medications, prescription or OTC?" (e.g., birth control pills, estrogen)     no 9. BLOOD THINNERS: "Do you take any blood thinners?" (e.g., Coumadin/warfarin, Pradaxa/dabigatran, aspirin)     no 10. CAUSE: "What do you think is causing the bleeding?" (e.g., recent gyn surgery, recent gyn procedure; known bleeding disorder, cervical cancer, polycystic ovarian disease, fibroids)         Has had cyst on ovaries since a teenager but haven't bothered her in 10 years. Has a h/o ruptured cyst 12 years ago. 11. HEMODYNAMIC STATUS: "Are you weak or feeling lightheaded?" If so, ask: "Can you stand and walk normally?"        Extremely fatigued past week and a half has slept a lot more lately 12. OTHER SYMPTOMS: "What other symptoms are you having with the bleeding?" (e.g., passed tissue, vaginal discharge, fever, menstrual-type cramps)  2-3 golf ball sized clots, no cramps, loose stools for a week, no fever  Protocols used: VAGINAL BLEEDING - ABNORMAL-A-AH

## 2017-09-27 NOTE — MAU Provider Note (Deleted)
History     CSN: [id number]  Arrival date and time: 09/27/17 1751   First Provider Initiated Contact with Patient 09/27/17 1846      Chief Complaint  Patient presents with  . Vaginal Bleeding   Julie Fox is a 41 y.o. G0P0 who presents today with vaginal bleeding. She states that she has been bleeding every day since about 08/28/17. She reports that in the last 2 weeks it has gotten much heavier, and she is passing golf ball sized clots 2-3 times per day.    Vaginal Bleeding  This is a new problem. The current episode started 1 to 4 weeks ago. The problem occurs constantly. The problem has been gradually worsening. The patient is experiencing no pain. She is not pregnant. Pertinent negatives include no chills, dysuria, fever, frequency, headaches, nausea, urgency or vomiting. The vaginal bleeding is heavier than menses. She has been passing clots. She has not been passing tissue. Nothing aggravates the symptoms. She has tried nothing for the symptoms. She is sexually active (last intercourse in September. ). She uses nothing for contraception. Her menstrual history has been regular (LNMP 08/09/17).    Past Medical History:  Diagnosis Date  . GAD (generalized anxiety disorder)   . GERD (gastroesophageal reflux disease)   . History of epilepsy   . HIV (human immunodeficiency virus infection) ([place])   . Hx of abnormal cervical Pap smear    2012 per pt, then normal since   . Migraine   . Obesity   . Pure hypercholesterolemia     Past Surgical History:  Procedure Laterality Date  . CHOLECYSTECTOMY    . TOE SURGERY     age 14-cyst excision per patient  . TONSILLECTOMY  2016    Family History  Problem Relation Age of Onset  . Hypertension Mother   . Hyperlipidemia Mother   . Diabetes Mother   . Cancer Father   . Non-Hodgkin's lymphoma Maternal Grandmother   . Cancer Maternal Grandfather     Social History   Tobacco Use  . Smoking status: Current Every Day Smoker     Packs/day: 0.10    Types: Cigarettes    Start date: 12/24/1995  . Smokeless tobacco: Never Used  . Tobacco comment: cutting back. chantix did not work.  Substance Use Topics  . Alcohol use: No    Frequency: Never    Comment: once a month  . Drug use: No    Allergies:  Allergies  Allergen Reactions  . Penicillins Anaphylaxis    Throat swells  . Sulfa Antibiotics Rash    Medications Prior to Admission  Medication Sig Dispense Refill Last Dose  . atorvastatin (LIPITOR) 20 MG tablet Take 1 tablet (20 mg total) by mouth daily. 30 tablet 11 Taking  . benzonatate (TESSALON PERLES) 100 MG capsule Take 1 capsule (100 mg total) by mouth 3 (three) times daily as needed. (Patient not taking: Reported on 08/30/2017) 20 capsule 0 Not Taking  . citalopram (CELEXA) 40 MG tablet Take 1 tablet (40 mg total) daily by mouth. 30 tablet 11   . doxycycline (VIBRA-TABS) 100 MG tablet Take 1 tablet (100 mg total) by mouth 2 (two) times daily. (Patient not taking: Reported on 08/30/2017) 20 tablet 0 Not Taking  . GENVOYA 150-150-200-10 MG TABS tablet TAKE 1 TABLET BY MOUTH DAILY WITH BREAKFAST 30 tablet 4 Taking  . traZODone (DESYREL) 50 MG tablet Take 1 tablet (50 mg total) by mouth at bedtime. (Patient not taking: Reported on 08/30/2017)  30 tablet 11 Not Taking    Review of Systems  Constitutional: Negative for chills and fever.  Respiratory: Negative for shortness of breath.   Gastrointestinal: Negative for nausea and vomiting.  Genitourinary: Positive for vaginal bleeding. Negative for dysuria, frequency and urgency.  Neurological: Negative for syncope and headaches.   Physical Exam   Blood pressure 139/79, pulse 78, temperature 98.4 F (36.9 C), temperature source Oral, resp. rate 16, weight 268 lb 4 oz (121.7 kg), last menstrual period 08/29/2017, SpO2 99 %.  Physical Exam  Nursing note and vitals reviewed. Constitutional: She is oriented to person, place, and time. She appears well-developed  and well-nourished. No distress.  HENT:  Head: Normocephalic.  Cardiovascular: Normal rate.  Respiratory: Effort normal.  GI: Soft. There is no tenderness. There is no rebound.  Neurological: She is alert and oriented to person, place, and time.  Skin: Skin is warm and dry.  Psychiatric: She has a normal mood and affect.   Results for orders placed or performed during the hospital encounter of 09/27/17 (from the past 24 hour(s))  Urinalysis, Routine w reflex microscopic     Status: Abnormal   Collection Time: 09/27/17  6:12 PM  Result Value Ref Range   Color, Urine YELLOW YELLOW   APPearance CLEAR CLEAR   Specific Gravity, Urine 1.015 1.005 - 1.030   pH 7.0 5.0 - 8.0   Glucose, UA NEGATIVE NEGATIVE mg/dL   Hgb urine dipstick LARGE (A) NEGATIVE   Bilirubin Urine NEGATIVE NEGATIVE   Ketones, ur NEGATIVE NEGATIVE mg/dL   Protein, ur NEGATIVE NEGATIVE mg/dL   Nitrite NEGATIVE NEGATIVE   Leukocytes, UA NEGATIVE NEGATIVE   RBC / HPF TOO NUMEROUS TO COUNT 0 - 5 RBC/hpf   WBC, UA 0-5 0 - 5 WBC/hpf   Bacteria, UA RARE (A) NONE SEEN   Squamous Epithelial / LPF 0-5 (A) NONE SEEN   Mucus PRESENT   Pregnancy, urine POC     Status: None   Collection Time: 09/27/17  6:31 PM  Result Value Ref Range   Preg Test, Ur NEGATIVE NEGATIVE  CBC     Status: None   Collection Time: 09/27/17  7:12 PM  Result Value Ref Range   WBC 7.9 4.0 - 10.5 K/uL   RBC 3.89 3.87 - 5.11 MIL/uL   Hemoglobin 12.4 12.0 - 15.0 g/dL   HCT 38.3 36.0 - 46.0 %   MCV 98.5 78.0 - 100.0 fL   MCH 31.9 26.0 - 34.0 pg   MCHC 32.4 30.0 - 36.0 g/dL   RDW 13.2 11.5 - 15.5 %   Platelets 312 150 - 400 K/uL    MAU Course  Procedures  MDM DW patient the option for expectant management v medication management with megace. She would like to try Megace at this time. Will send in rx to pharmacy, and outpatient [place] with FU in GYN clinic.   Assessment and Plan   1. Abnormal uterine bleeding    DC home Comfort measures reviewed   1st/2nd/3rd Trimester precautions  Bleeding precautions Ectopic precautions PTL precautions  Fetal kick counts RX: megace 40mg  BID #60 with 3 RF  Return to MAU as needed FU with OB as planned  [place] Follow up.   Specialty:  Radiology Contact information: [address] [phone] [place] [facility] [place] Follow up.   Specialty:  Obstetrics and Gynecology  Contact information: [place] 09/27/2017, 7:41 PM

## 2017-09-28 LAB — GC/CHLAMYDIA PROBE AMP (~~LOC~~) NOT AT ARMC
CHLAMYDIA, DNA PROBE: NEGATIVE
Neisseria Gonorrhea: NEGATIVE

## 2017-09-28 NOTE — Telephone Encounter (Signed)
I confirmed patient went to hospital on 09/27/2017.

## 2017-09-29 ENCOUNTER — Ambulatory Visit: Payer: 59 | Admitting: Family Medicine

## 2017-10-04 NOTE — MAU Provider Note (Signed)
History     CSN: [id number]  Arrival date and time: 09/27/17 1751   First Provider Initiated Contact with Patient 09/27/17 1846      Chief Complaint  Patient presents with  . Vaginal Bleeding   Julie Fox is a 41 y.o. G0P0 who presents today with vaginal bleeding. She states that she has been bleeding every day since about 08/28/17. She reports that in the last 2 weeks it has gotten much heavier, and she is passing golf ball sized clots 2-3 times per day.    Vaginal Bleeding  This is a new problem. The current episode started 1 to 4 weeks ago. The problem occurs constantly. The problem has been gradually worsening. The patient is experiencing no pain. She is not pregnant. Pertinent negatives include no chills, dysuria, fever, frequency, headaches, nausea, urgency or vomiting. The vaginal bleeding is heavier than menses. She has been passing clots. She has not been passing tissue. Nothing aggravates the symptoms. She has tried nothing for the symptoms. She is sexually active (last intercourse in September. ). She uses nothing for contraception. Her menstrual history has been regular (LNMP 08/09/17).    Past Medical History:  Diagnosis Date  . GAD (generalized anxiety disorder)   . GERD (gastroesophageal reflux disease)   . History of epilepsy   . HIV (human immunodeficiency virus infection) ([place])   . Hx of abnormal cervical Pap smear    2012 per pt, then normal since   . Migraine   . Obesity   . Pure hypercholesterolemia     Past Surgical History:  Procedure Laterality Date  . CHOLECYSTECTOMY    . TOE SURGERY     age 63-cyst excision per patient  . TONSILLECTOMY  2016    Family History  Problem Relation Age of Onset  . Hypertension Mother   . Hyperlipidemia Mother   . Diabetes Mother   . Cancer Father   . Non-Hodgkin's lymphoma Maternal Grandmother   . Cancer Maternal Grandfather     Social History   Tobacco Use  . Smoking status: Current Every Day Smoker     Packs/day: 0.10    Types: Cigarettes    Start date: 12/24/1995  . Smokeless tobacco: Never Used  . Tobacco comment: cutting back. chantix did not work.  Substance Use Topics  . Alcohol use: No    Frequency: Never    Comment: once a month  . Drug use: No    Allergies:  Allergies  Allergen Reactions  . Penicillins Anaphylaxis    Has patient had a PCN reaction causing immediate rash, facial/tongue/throat swelling, SOB or lightheadedness with hypotension: yes Has patient had a PCN reaction causing severe rash involving mucus membranes or skin necrosis: no Has patient had a PCN reaction that required hospitalization: no Has patient had a PCN reaction occurring within the last 10 years: yes If all of the above answers are "NO", then may proceed with Cephalosporin use.   . Sulfa Antibiotics Rash    No medications prior to admission.    Review of Systems  Constitutional: Negative for chills and fever.  Respiratory: Negative for shortness of breath.   Gastrointestinal: Negative for nausea and vomiting.  Genitourinary: Positive for vaginal bleeding. Negative for dysuria, frequency and urgency.  Neurological: Negative for syncope and headaches.   Physical Exam   Blood pressure 123/64, pulse 88, temperature 98.8 F (37.1 C), temperature source Oral, resp. rate 18, weight 268 lb 4 oz (121.7 kg), last menstrual period 08/29/2017, SpO2 99 %.  Physical Exam  Nursing note and vitals reviewed. Constitutional: She is oriented to person, place, and time. She appears well-developed and well-nourished. No distress.  HENT:  Head: Normocephalic.  Cardiovascular: Normal rate.  Respiratory: Effort normal.  GI: Soft. There is no tenderness. There is no rebound.  Neurological: She is alert and oriented to person, place, and time.  Skin: Skin is warm and dry.  Psychiatric: She has a normal mood and affect.   No results found for this or any previous visit (from the past 24 hour(s)).  MAU  Course  Procedures  MDM DW patient the option for expectant management v medication management with megace. She would like to try Megace at this time. Will send in rx to pharmacy, and outpatient [place] with FU in GYN clinic.   Assessment and Plan   1. Abnormal uterine bleeding    DC home Comfort measures reviewed  Bleeding precautions RX: megace 40mg  BID #60 with 3 RF  Return to MAU as needed   [place] Follow up.   Specialty:  Radiology Contact information: [address] [id number] [place] [facility] [place] Follow up.   Specialty:  Obstetrics and Gynecology Contact information: [place] 10/04/2017, 3:33 PM

## 2017-10-07 ENCOUNTER — Ambulatory Visit (HOSPITAL_COMMUNITY): Admission: RE | Admit: 2017-10-07 | Payer: 59 | Source: Ambulatory Visit

## 2017-10-21 ENCOUNTER — Encounter: Payer: Self-pay | Admitting: Obstetrics & Gynecology

## 2017-10-22 ENCOUNTER — Other Ambulatory Visit: Payer: Self-pay | Admitting: Advanced Practice Midwife

## 2017-11-03 ENCOUNTER — Encounter: Payer: Self-pay | Admitting: Family Medicine

## 2017-11-06 ENCOUNTER — Encounter (HOSPITAL_COMMUNITY): Payer: Self-pay | Admitting: *Deleted

## 2017-11-06 ENCOUNTER — Inpatient Hospital Stay (HOSPITAL_COMMUNITY)
Admission: AD | Admit: 2017-11-06 | Discharge: 2017-11-06 | Disposition: A | Discharge status: Home / Self Care | Payer: 59 | Source: Ambulatory Visit | Attending: Obstetrics and Gynecology | Admitting: Obstetrics and Gynecology

## 2017-11-06 DIAGNOSIS — N946 Dysmenorrhea, unspecified: Secondary | ICD-10-CM | POA: Diagnosis not present

## 2017-11-06 DIAGNOSIS — Z880 Allergy status to penicillin: Secondary | ICD-10-CM | POA: Diagnosis not present

## 2017-11-06 DIAGNOSIS — F17210 Nicotine dependence, cigarettes, uncomplicated: Secondary | ICD-10-CM | POA: Diagnosis not present

## 2017-11-06 DIAGNOSIS — N939 Abnormal uterine and vaginal bleeding, unspecified: Secondary | ICD-10-CM | POA: Diagnosis not present

## 2017-11-06 LAB — URINALYSIS, ROUTINE W REFLEX MICROSCOPIC
BACTERIA UA: NONE SEEN
Bilirubin Urine: NEGATIVE
Glucose, UA: NEGATIVE mg/dL
Ketones, ur: NEGATIVE mg/dL
NITRITE: NEGATIVE
Protein, ur: 100 mg/dL — AB
SPECIFIC GRAVITY, URINE: 1.025 (ref 1.005–1.030)
Squamous Epithelial / LPF: NONE SEEN
pH: 5 (ref 5.0–8.0)

## 2017-11-06 LAB — CBC WITH DIFFERENTIAL/PLATELET
BASOS ABS: 0 10*3/uL (ref 0.0–0.1)
Basophils Relative: 0 %
Eosinophils Absolute: 0.3 10*3/uL (ref 0.0–0.7)
Eosinophils Relative: 2 %
HEMATOCRIT: 39.1 % (ref 36.0–46.0)
HEMOGLOBIN: 13 g/dL (ref 12.0–15.0)
LYMPHS PCT: 34 %
Lymphs Abs: 3.8 10*3/uL (ref 0.7–4.0)
MCH: 31.5 pg (ref 26.0–34.0)
MCHC: 33.2 g/dL (ref 30.0–36.0)
MCV: 94.7 fL (ref 78.0–100.0)
MONO ABS: 0.7 10*3/uL (ref 0.1–1.0)
Monocytes Relative: 7 %
NEUTROS ABS: 6.2 10*3/uL (ref 1.7–7.7)
NEUTROS PCT: 57 %
Platelets: 266 10*3/uL (ref 150–400)
RBC: 4.13 MIL/uL (ref 3.87–5.11)
RDW: 12.9 % (ref 11.5–15.5)
WBC: 11 10*3/uL — AB (ref 4.0–10.5)

## 2017-11-06 LAB — TYPE AND SCREEN
ABO/RH(D): O POS
ANTIBODY SCREEN: NEGATIVE

## 2017-11-06 LAB — ABO/RH: ABO/RH(D): O POS

## 2017-11-06 LAB — POCT PREGNANCY, URINE: PREG TEST UR: NEGATIVE

## 2017-11-06 MED ORDER — MEGESTROL ACETATE 40 MG PO TABS: 40.0000 mg | ORAL_TABLET | Freq: Two times a day (BID) | ORAL | 3 refills | Status: DC

## 2017-11-06 MED ORDER — KETOROLAC TROMETHAMINE 10 MG PO TABS: 10.0000 mg | ORAL_TABLET | Freq: Four times a day (QID) | ORAL | 0 refills | Status: DC | PRN

## 2017-11-06 MED ORDER — KETOROLAC TROMETHAMINE 60 MG/2ML IM SOLN
60.0000 mg | Freq: Once | INTRAMUSCULAR | Status: AC
  Administered 2017-11-06: 60 mg via INTRAMUSCULAR
  Filled 2017-11-06: qty 2

## 2017-11-06 MED ORDER — MEGESTROL ACETATE 40 MG PO TABS
40.0000 mg | ORAL_TABLET | Freq: Once | ORAL | Status: AC
  Administered 2017-11-06: 40 mg via ORAL
  Filled 2017-11-06: qty 1

## 2017-11-06 NOTE — MAU Provider Note (Signed)
Chief Complaint: Vaginal Bleeding   First Provider Initiated Contact with Patient 11/06/17 0208     SUBJECTIVE HPI: Julie Fox is a 42 y.o.  female who presents to Maternity Admissions reporting heavy, prolonged vaginal bleeding and severe low abd cramping x 2 weeks. Same thing happened last month. Was Rx'd Megace x 1 week. Bleeding stopped, but return about two weeks after that. No prior Hx AUB. Has never had EBX.  Has appt at [facility] 11/14/17. Was supposed to have OP Pelvic US, but cancelled due to cost.   Wet Prep and Gc/Chlamydia at MAU visit 09/2017. Declines repeat.   Location: low abd Quality: crampign Severity: severe Duration: 2 weeks Context: during menses Timing: intermittent Modifying factors: No relief w/ Midol or Aleve Associated signs and symptoms: Neg for fever, chills, GI complaints, urinary complaints, vaginal discharge, dizziness. Pos for VB.   Past Medical History:  Diagnosis Date  . GAD (generalized anxiety disorder)   . GERD (gastroesophageal reflux disease)   . History of epilepsy   . HIV (human immunodeficiency virus infection) ([place])   . Hx of abnormal cervical Pap smear    2012 per pt, then normal since   . Migraine   . Obesity   . Pure hypercholesterolemia    OB History  Gravida Para Term Preterm AB Living  0 0 0 0 0 0  SAB TAB Ectopic Multiple Live Births  0 0 0 0 0       Past Surgical History:  Procedure Laterality Date  . CHOLECYSTECTOMY    . TOE SURGERY     age 45-cyst excision per patient  . TONSILLECTOMY  2016   Social History   Socioeconomic History  . Marital status: Single    Spouse name: Not on file  . Number of children: Not on file  . Years of education: Not on file  . Highest education level: Not on file  Social Needs  . Financial resource strain: Not on file  . Food insecurity - worry: Not on file  . Food insecurity - inability: Not on file  . Transportation needs - medical: Not on file  . Transportation needs -  non-medical: Not on file  Occupational History  . Not on file  Tobacco Use  . Smoking status: Current Every Day Smoker    Packs/day: 0.10    Types: Cigarettes    Start date: 12/24/1995  . Smokeless tobacco: Never Used  . Tobacco comment: cutting back. chantix did not work.  Substance and Sexual Activity  . Alcohol use: No    Frequency: Never    Comment: once a month  . Drug use: No  . Sexual activity: Not Currently    Partners: Male    Birth control/protection: None    Comment: declined condoms  Other Topics Concern  . Not on file  Social History Narrative   Work or School: Engineer, structural, 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: lives with mother      Spiritual Beliefs:      Lifestyle: no regular exercise, diet not great   Family History  Problem Relation Age of Onset  . Hypertension Mother   . Hyperlipidemia Mother   . Diabetes Mother   . Cancer Father   . Non-Hodgkin's lymphoma Maternal Grandmother   . Cancer Maternal Grandfather    No current facility-administered medications on file prior to encounter.    Current Outpatient Medications on File Prior to Encounter  Medication Sig Dispense Refill  .  atorvastatin (LIPITOR) 20 MG tablet Take 1 tablet (20 mg total) by mouth daily. 30 tablet 11  . benzonatate (TESSALON PERLES) 100 MG capsule Take 1 capsule (100 mg total) by mouth 3 (three) times daily as needed. (Patient not taking: Reported on 08/30/2017) 20 capsule 0  . citalopram (CELEXA) 40 MG tablet Take 1 tablet (40 mg total) daily by mouth. 30 tablet 11  . doxycycline (VIBRA-TABS) 100 MG tablet Take 1 tablet (100 mg total) by mouth 2 (two) times daily. (Patient not taking: Reported on 08/30/2017) 20 tablet 0  . GENVOYA 150-150-200-10 MG TABS tablet TAKE 1 TABLET BY MOUTH DAILY WITH BREAKFAST (Patient taking differently: Take 1 tablet by mouth at bedtime. ) 30 tablet 4  . traZODone (DESYREL) 50 MG tablet Take 1 tablet (50 mg total) by mouth at bedtime. (Patient taking  differently: Take 50 mg by mouth at bedtime as needed for sleep. ) 30 tablet 11   Allergies  Allergen Reactions  . Penicillins Anaphylaxis    Has patient had a PCN reaction causing immediate rash, facial/tongue/throat swelling, SOB or lightheadedness with hypotension: yes Has patient had a PCN reaction causing severe rash involving mucus membranes or skin necrosis: no Has patient had a PCN reaction that required hospitalization: no Has patient had a PCN reaction occurring within the last 10 years: yes If all of the above answers are "NO", then may proceed with Cephalosporin use.   . Sulfa Antibiotics Rash    I have reviewed patient's Past Medical Hx, Surgical Hx, Family Hx, Social Hx, medications and allergies.   Review of Systems  Constitutional: Negative for appetite change, chills and fever.  Gastrointestinal: Positive for abdominal pain. Negative for abdominal distention, constipation, diarrhea, nausea and vomiting.  Genitourinary: Positive for menstrual problem, pelvic pain and vaginal bleeding. Negative for difficulty urinating, dysuria, frequency, hematuria, urgency and vaginal discharge.  Musculoskeletal: Negative for back pain.  Neurological: Negative for dizziness.    OBJECTIVE Patient Vitals for the past 24 hrs:  BP Temp Pulse Resp Height Weight  11/06/17 0048 123/80 98.6 F (37 C) 97 18 5\' 8"  (1.727 m) 261 lb (118.4 kg)   Constitutional: Well-developed, well-nourished, morbidly obese female in no acute distress.  Cardiovascular: normal rate Respiratory: normal rate and effort.  GI: Abd soft, mild low abd tenderness, No guarding, rebound tenderness or mass. Pos BS x 4 MS: Extremities nontender, no edema, normal ROM Neurologic: Alert and oriented x 4.  GU:  SPECULUM EXAM: NEFG, physiologic discharge, small-mod amount or bright red blood noted, cervix clean, Nml ectropion. No polyps.  BIMANUAL: cervix closed; uterus normal size, no adnexal tenderness or masses. Mild  CMT.  LAB RESULTS Results for orders placed or performed during the hospital encounter of 11/06/17 (from the past 24 hour(s))  Urinalysis, Routine w reflex microscopic     Status: Abnormal   Collection Time: 11/06/17 12:35 AM  Result Value Ref Range   Color, Urine YELLOW YELLOW   APPearance HAZY (A) CLEAR   Specific Gravity, Urine 1.025 1.005 - 1.030   pH 5.0 5.0 - 8.0   Glucose, UA NEGATIVE NEGATIVE mg/dL   Hgb urine dipstick LARGE (A) NEGATIVE   Bilirubin Urine NEGATIVE NEGATIVE   Ketones, ur NEGATIVE NEGATIVE mg/dL   Protein, ur 100 (A) NEGATIVE mg/dL   Nitrite NEGATIVE NEGATIVE   Leukocytes, UA SMALL (A) NEGATIVE   RBC / HPF TOO NUMEROUS TO COUNT 0 - 5 RBC/hpf   WBC, UA TOO NUMEROUS TO COUNT 0 - 5 WBC/hpf  Bacteria, UA NONE SEEN NONE SEEN   Squamous Epithelial / LPF NONE SEEN NONE SEEN   Mucus PRESENT   Pregnancy, urine POC     Status: None   Collection Time: 11/06/17 12:48 AM  Result Value Ref Range   Preg Test, Ur NEGATIVE NEGATIVE  CBC with Differential/Platelet     Status: Abnormal   Collection Time: 11/06/17  1:10 AM  Result Value Ref Range   WBC 11.0 (H) 4.0 - 10.5 K/uL   RBC 4.13 3.87 - 5.11 MIL/uL   Hemoglobin 13.0 12.0 - 15.0 g/dL   HCT 39.1 36.0 - 46.0 %   MCV 94.7 78.0 - 100.0 fL   MCH 31.5 26.0 - 34.0 pg   MCHC 33.2 30.0 - 36.0 g/dL   RDW 12.9 11.5 - 15.5 %   Platelets 266 150 - 400 K/uL   Neutrophils Relative % 57 %   Neutro Abs 6.2 1.7 - 7.7 K/uL   Lymphocytes Relative 34 %   Lymphs Abs 3.8 0.7 - 4.0 K/uL   Monocytes Relative 7 %   Monocytes Absolute 0.7 0.1 - 1.0 K/uL   Eosinophils Relative 2 %   Eosinophils Absolute 0.3 0.0 - 0.7 K/uL   Basophils Relative 0 %   Basophils Absolute 0.0 0.0 - 0.1 K/uL  Type and screen     Status: None   Collection Time: 11/06/17  1:10 AM  Result Value Ref Range   ABO/RH(D) O POS    Antibody Screen NEG    Sample Expiration 11/09/2017    UA likely contaminated w/ vaginal blood. Will culture due to abd pain.    IMAGING No results found.  MAU COURSE Orders Placed This Encounter  Procedures  . Urine Culture  . Urinalysis, Routine w reflex microscopic  . CBC with Differential/Platelet  . Pregnancy, urine POC  . Type and screen  . ABO/Rh  . Discharge patient   Meds ordered this encounter  Medications  . ketorolac (TORADOL) injection 60 mg  . megestrol (MEGACE) tablet 40 mg  . megestrol (MEGACE) 40 MG tablet    Sig: Take 1 tablet (40 mg total) by mouth 2 (two) times daily. Continue medication until your follow appointment    Dispense:  60 tablet    Refill:  3    Order Specific Question:   Supervising Provider    Answer:   CONSTANT, PEGGY [[id number]]  . ketorolac (TORADOL) 10 MG tablet    Sig: Take 1 tablet (10 mg total) by mouth every 6 (six) hours as needed.    Dispense:  20 tablet    Refill:  0    Order Specific Question:   Supervising Provider    Answer:   CONSTANT, PEGGY [[id number]]    MDM - Vaginal bleeding of unknown etiology. Suspect perimenopausal etiology considering hot flashes and FH Menopause around age 49, but cannot rule out endometrial CA. Hemodynamically stable. Will Tx w/ Megace and give  Her enough to last until Gyn appt.  - Low abd pain C/W dysmenorrhea from heavy bleeding/passing clots. Low suspicion for PID or other emergent condition due to pt not sexually active, recent neg cultures, absence for fever, leukocytosis or significant CMT.   ASSESSMENT 1. Abnormal uterine bleeding (AUB)   2. Dysmenorrhea     PLAN Discharge home in stable condition. Bleeding precautions Rx Megace. Continue medication until your follow appointment.  Follow-up [place] [facility] [place] Follow up on 11/14/2017.   Specialty:  Obstetrics and Gynecology Why:  as scheduled Contact information: [place] Follow up.   Why:  in gynecology  emergencies Contact information: [address] [id number] [place] [phone]         Allergies as of 11/06/2017      Reactions   Penicillins Anaphylaxis   Has patient had a PCN reaction causing immediate rash, facial/tongue/throat swelling, SOB or lightheadedness with hypotension: yes Has patient had a PCN reaction causing severe rash involving mucus membranes or skin necrosis: no Has patient had a PCN reaction that required hospitalization: no Has patient had a PCN reaction occurring within the last 10 years: yes If all of the above answers are "NO", then may proceed with Cephalosporin use.   Sulfa Antibiotics Rash      Medication List    STOP taking these medications   benzonatate 100 MG capsule Commonly known as:  TESSALON PERLES   doxycycline 100 MG tablet Commonly known as:  VIBRA-TABS     TAKE these medications   atorvastatin 20 MG tablet Commonly known as:  LIPITOR Take 1 tablet (20 mg total) by mouth daily.   citalopram 40 MG tablet Commonly known as:  CELEXA Take 1 tablet (40 mg total) daily by mouth.   GENVOYA 150-150-200-10 MG Tabs tablet Generic drug:  elvitegravir-cobicistat-emtricitabine-tenofovir TAKE 1 TABLET BY MOUTH DAILY WITH BREAKFAST What changed:  when to take this   ketorolac 10 MG tablet Commonly known as:  TORADOL Take 1 tablet (10 mg total) by mouth every 6 (six) hours as needed.   megestrol 40 MG tablet Commonly known as:  MEGACE Take 1 tablet (40 mg total) by mouth 2 (two) times daily. Continue medication until your follow appointment What changed:  additional instructions   traZODone 50 MG tablet Commonly known as:  DESYREL Take 1 tablet (50 mg total) by mouth at bedtime. What changed:    when to take this  reasons to take this        Tamala Julian [place], [facility] 11/06/2017  3:25 AM

## 2017-11-06 NOTE — MAU Note (Signed)
Pt stated she has been bleeding for 2 weeks. Passing large clots and having very bad cramping. Has taken Midol and aleve for pain and has not helped the cramping. Stated she is changing pad about every 19min. Had same thing happen last month. Given megace and it helped but started bleeding heavy again. Had appointment to have u/s but did not have it done due to cost. F?u appointment in clinic this month scheduled for 1/21./19. Also c/o hot and cold flashes.

## 2017-11-06 NOTE — Discharge Instructions (Signed)
Abnormal Uterine Bleeding Abnormal uterine bleeding can affect women at various stages in life, including teenagers, women in their reproductive years, pregnant women, and women who have reached menopause. Several kinds of uterine bleeding are considered abnormal, including:  Bleeding or spotting between periods.  Bleeding after sexual intercourse.  Bleeding that is heavier or more than normal.  Periods that last longer than usual.  Bleeding after menopause.  Many cases of abnormal uterine bleeding are minor and simple to treat, while others are more serious. Any type of abnormal bleeding should be evaluated by your health care provider. Treatment will depend on the cause of the bleeding. Follow these instructions at home: Monitor your condition for any changes. The following actions may help to alleviate any discomfort you are experiencing:  Avoid the use of tampons and douches as directed by your health care provider.  Change your pads frequently.  You should get regular pelvic exams and Pap tests. Keep all follow-up appointments for diagnostic tests as directed by your health care provider. Contact a health care provider if:  Your bleeding lasts more than 1 week.  You feel dizzy at times. Get help right away if:  You pass out.  You are changing pads every 15 to 30 minutes.  You have abdominal pain.  You have a fever.  You become sweaty or weak.  You are passing large blood clots from the vagina.  You start to feel nauseous and vomit. This information is not intended to replace advice given to you by your health care provider. Make sure you discuss any questions you have with your health care provider. Document Released: 10/11/2005 Document Revised: 03/24/2016 Document Reviewed: 05/10/2013 Elsevier Interactive Patient Education  2017 Julie Fox.  Dysmenorrhea Menstrual cramps (dysmenorrhea) are caused by the muscles of the uterus tightening (contracting) during a  menstrual period. For some women, this discomfort is merely bothersome. For others, dysmenorrhea can be severe enough to interfere with everyday activities for a few days each month. Primary dysmenorrhea is menstrual cramps that last a couple of days when you start having menstrual periods or soon after. This often begins after a teenager starts having her period. As a woman gets older or has a baby, the cramps will usually lessen or disappear. Secondary dysmenorrhea begins later in life, lasts longer, and the pain may be stronger than primary dysmenorrhea. The pain may start before the period and last a few days after the period. What are the causes? Dysmenorrhea is usually caused by an underlying problem, such as:  The tissue lining the uterus grows outside of the uterus in other areas of the body (endometriosis).  The endometrial tissue, which normally lines the uterus, is found in or grows into the muscular walls of the uterus (adenomyosis).  The pelvic blood vessels are engorged with blood just before the menstrual period (pelvic congestive syndrome).  Overgrowth of cells (polyps) in the lining of the uterus or cervix.  Falling down of the uterus (prolapse) because of loose or stretched ligaments.  Depression.  Bladder problems, infection, or inflammation.  Problems with the intestine, a tumor, or irritable bowel syndrome.  Cancer of the female organs or bladder.  A severely tipped uterus.  A very tight opening or closed cervix.  Noncancerous tumors of the uterus (fibroids).  Pelvic inflammatory disease (PID).  Pelvic scarring (adhesions) from a previous surgery.  Ovarian cyst.  An intrauterine device (IUD) used for birth control.  What increases the risk? You may be at greater risk of  dysmenorrhea if:  You are younger than age 30.  You started puberty early.  You have irregular or heavy bleeding.  You have never given birth.  You have a family history of this  problem.  You are a smoker.  What are the signs or symptoms?  Cramping or throbbing pain in your lower abdomen.  Headaches.  Lower back pain.  Nausea or vomiting.  Diarrhea.  Sweating or dizziness.  Loose stools. How is this diagnosed? A diagnosis is based on your history, symptoms, physical exam, diagnostic tests, or procedures. Diagnostic tests or procedures may include:  Blood tests.  Ultrasonography.  An examination of the lining of the uterus (dilation and curettage, D&C).  An examination inside your abdomen or pelvis with a scope (laparoscopy).  X-rays.  CT scan.  MRI.  An examination inside the bladder with a scope (cystoscopy).  An examination inside the intestine or stomach with a scope (colonoscopy, gastroscopy).  How is this treated? Treatment depends on the cause of the dysmenorrhea. Treatment may include:  Pain medicine prescribed by your health care provider.  Birth control pills or an IUD with progesterone hormone in it.  Hormone replacement therapy.  Nonsteroidal anti-inflammatory drugs (NSAIDs). These may help stop the production of prostaglandins.  Surgery to remove adhesions, endometriosis, ovarian cyst, or fibroids.  Removal of the uterus (hysterectomy).  Progesterone shots to stop the menstrual period.  Cutting the nerves on the sacrum that go to the female organs (presacral neurectomy).  Electric current to the sacral nerves (sacral nerve stimulation).  Antidepressant medicine.  Psychiatric therapy, counseling, or group therapy.  Exercise and physical therapy.  Meditation and yoga therapy.  Acupuncture.  Follow these instructions at home:  Only take over-the-counter or prescription medicines as directed by your health care provider.  Place a heating pad or hot water bottle on your lower back or abdomen. Do not sleep with the heating pad.  Use aerobic exercises, walking, swimming, biking, and other exercises to help  lessen the cramping.  Massage to the lower back or abdomen may help.  Stop smoking.  Avoid alcohol and caffeine. Contact a health care provider if:  Your pain does not get better with medicine.  You have pain with sexual intercourse.  Your pain increases and is not controlled with medicines.  You have abnormal vaginal bleeding with your period.  You develop nausea or vomiting with your period that is not controlled with medicine. Get help right away if: You pass out. This information is not intended to replace advice given to you by your health care provider. Make sure you discuss any questions you have with your health care provider. Document Released: 10/11/2005 Document Revised: 03/18/2016 Document Reviewed: 03/29/2013 Elsevier Interactive Patient Education  2017 Julie Fox.

## 2017-11-07 LAB — URINE CULTURE: Culture: <10000 — AB

## 2017-11-14 ENCOUNTER — Encounter: Payer: Self-pay | Admitting: Obstetrics & Gynecology

## 2017-11-14 ENCOUNTER — Ambulatory Visit: Payer: 59 | Admitting: Obstetrics & Gynecology

## 2017-11-14 ENCOUNTER — Other Ambulatory Visit (HOSPITAL_COMMUNITY)
Admission: RE | Admit: 2017-11-14 | Discharge: 2017-11-14 | Disposition: A | Discharge status: Home / Self Care | Payer: 59 | Source: Ambulatory Visit | Attending: Obstetrics & Gynecology | Admitting: Obstetrics & Gynecology

## 2017-11-14 VITALS — BP 143/86 | HR 109 | Wt 258.1 lb

## 2017-11-14 DIAGNOSIS — N939 Abnormal uterine and vaginal bleeding, unspecified: Secondary | ICD-10-CM | POA: Diagnosis not present

## 2017-11-14 LAB — POCT PREGNANCY, URINE: PREG TEST UR: NEGATIVE

## 2017-11-14 NOTE — Progress Notes (Signed)
History:  42 y.o. G0P0000 here today for f/u of AUB. Pt reports that she has been bleeding heavy for 2 months. Prior to Nov she was bleeding monthly had 2-3 days of heavy bleedign but, this was her usual.  She started Nov 5th -late Dec. She was seen in the ED ~Dec 18th. She initially thought it was due to stress.  She was initially given Megace which stopped the bleeding but, after a week she stopped and the bleeding returned. She was seen again and started 40mg  bid and the bleeding has slowed down but, it has not stopped after 8 days. The bleeding is heavy with clots of different sizes. The bleeding is assoc with pain as well. Pt denies weight loss. No fever or chills.  Pt reports that she feels cold all the time. This started in Nov.   Pt is not sexually active.   Had a neg UPT in the [facility].      The following portions of the patient's history were reviewed and updated as appropriate: allergies, current medications, past family history, past medical history, past social history, past surgical history and problem list.  Review of Systems:  Pertinent items are noted in HPI.   Objective:  Physical Exam Blood pressure (!) 143/86, pulse (!) 109, weight 258 lb 1.6 oz (117.1 kg), last menstrual period 10/25/2017. CONSTITUTIONAL: Well-developed, well-nourished female in no acute distress.  HENT:  Normocephalic, atraumatic EYES: Conjunctivae and EOM are normal. No scleral icterus.  NECK: Normal range of motion SKIN: Skin is warm and dry. No rash noted. Not diaphoretic.No pallor. [place]: Alert and oriented to person, place, and time. Normal coordination.  Abd: Soft, nontender and nondistended Pelvic: Normal appearing external genitalia; normal appearing vaginal mucosa and cervix.  Normal discharge.  Small uterus, no other palpable masses, no uterine or adnexal tenderness  The indications for endometrial biopsy were reviewed.   Risks of the biopsy including cramping, bleeding, infection, uterine  perforation, inadequate specimen and need for additional procedures  were discussed. The patient states she understands and agrees to undergo procedure today. Consent was signed. Time out was performed. Urine HCG was negative. A sterile speculum was placed in the patient's vagina and the cervix was prepped with Betadine. A single-toothed tenaculum was placed on the anterior lip of the cervix to stabilize it. The 3 mm pipelle was introduced into the endometrial cavity without difficulty to a depth of 8cm, and a moderate amount of tissue was obtained and sent to pathology. The instruments were removed from the patient's vagina. Minimal bleeding from the cervix was noted. The patient tolerated the procedure well.   Labs and Imaging CBC    Component Value Date/Time   WBC 11.0 (H) 11/06/2017 0110   RBC 4.13 11/06/2017 0110   HGB 13.0 11/06/2017 0110   HCT 39.1 11/06/2017 0110   PLT 266 11/06/2017 0110   MCV 94.7 11/06/2017 0110   MCH 31.5 11/06/2017 0110   MCHC 33.2 11/06/2017 0110   RDW 12.9 11/06/2017 0110   LYMPHSABS 3.8 11/06/2017 0110   MONOABS 0.7 11/06/2017 0110   EOSABS 0.3 11/06/2017 0110   BASOSABS 0.0 11/06/2017 0110      Assessment & Plan:  AUB not responsive to Megace 40mg  bid  S/p endo bx  Routine post-procedure instructions were given to the patient. The patient will follow up to  review the results and for further management.    Pelvic US  F/u in 2 week Pt interested in a Hyst. Pt given info  on hyst vs endo bx. She will f/u in 2 week to review and will read written info on tx options prior to that time.  Daekwon Beswick L. Harraway-Smith, M.D., [facility] June

## 2017-11-14 NOTE — Progress Notes (Signed)
STates Megace has slowed bleeding some. Using 7-8 pads daily which are "pretty full". Asked pt if she is interested in talking with [facility] based on screening scores. Pt states she feels some of anxiety/depression due to vag bleeding going on so long but she is doing ok and declines visit with [facility]

## 2017-11-14 NOTE — Patient Instructions (Signed)
Endometrial Ablation Endometrial ablation is a procedure that destroys the thin inner layer of the lining of the uterus (endometrium). This procedure may be done:  To stop heavy periods.  To stop bleeding that is causing anemia.  To control irregular bleeding.  To treat bleeding caused by small tumors (fibroids) in the endometrium.  This procedure is often an alternative to major surgery, such as removal of the uterus and cervix (hysterectomy). As a result of this procedure:  You may not be able to have children. However, if you are premenopausal (you have not gone through menopause): ? You may still have a small chance of getting pregnant. ? You will need to use a reliable method of birth control after the procedure to prevent pregnancy.  You may stop having a menstrual period, or you may have only a small amount of bleeding during your period. Menstruation may return several years after the procedure.  Tell a health care provider about:  Any allergies you have.  All medicines you are taking, including vitamins, herbs, eye drops, creams, and over-the-counter medicines.  Any problems you or family members have had with the use of anesthetic medicines.  Any blood disorders you have.  Any surgeries you have had.  Any medical conditions you have. What are the risks? Generally, this is a safe procedure. However, problems may occur, including:  A hole (perforation) in the uterus or bowel.  Infection of the uterus, bladder, or vagina.  Bleeding.  Damage to other structures or organs.  An air bubble in the lung (air embolus).  Problems with pregnancy after the procedure.  Failure of the procedure.  Decreased ability to diagnose cancer in the endometrium.  What happens before the procedure?  You will have tests of your endometrium to make sure there are no pre-cancerous cells or cancer cells present.  You may have an ultrasound of the uterus.  You may be given  medicines to thin the endometrium.  Ask your health care provider about: ? Changing or stopping your regular medicines. This is especially important if you take diabetes medicines or blood thinners. ? Taking medicines such as aspirin and ibuprofen. These medicines can thin your blood. Do not take these medicines before your procedure if your doctor tells you not to.  Plan to have someone take you home from the hospital or clinic. What happens during the procedure?  You will lie on an exam table with your feet and legs supported as in a pelvic exam.  To lower your risk of infection: ? Your health care team will wash or sanitize their hands and put on germ-free (sterile) gloves. ? Your genital area will be washed with soap.  An IV tube will be inserted into one of your veins.  You will be given a medicine to help you relax (sedative).  A surgical instrument with a light and camera (resectoscope) will be inserted into your vagina and moved into your uterus. This allows your surgeon to see inside your uterus.  Endometrial tissue will be removed using one of the following methods: ? Radiofrequency. This method uses a radiofrequency-alternating electric current to remove the endometrium. ? Cryotherapy. This method uses extreme cold to freeze the endometrium. ? Heated-free liquid. This method uses a heated saltwater (saline) solution to remove the endometrium. ? Microwave. This method uses high-energy microwaves to heat up the endometrium and remove it. ? Thermal balloon. This method involves inserting a catheter with a balloon tip into the uterus. The balloon tip is   filled with heated fluid to remove the endometrium. The procedure may vary among health care providers and hospitals. What happens after the procedure?  Your blood pressure, heart rate, breathing rate, and blood oxygen level will be monitored until the medicines you were given have worn off.  As tissue healing occurs, you may  notice vaginal bleeding for 4-6 weeks after the procedure. You may also experience: ? Cramps. ? Thin, watery vaginal discharge that is light pink or brown in color. ? A need to urinate more frequently than usual. ? Nausea.  Do not drive for 24 hours if you were given a sedative.  Do not have sex or insert anything into your vagina until your health care provider approves. Summary  Endometrial ablation is done to treat the many causes of heavy menstrual bleeding.  The procedure may be done only after medications have been tried to control the bleeding.  Plan to have someone take you home from the hospital or clinic. This information is not intended to replace advice given to you by your health care provider. Make sure you discuss any questions you have with your health care provider. Document Released: 08/20/2004 Document Revised: 10/28/2016 Document Reviewed: 10/28/2016 Elsevier Interactive Patient Education  2017 Else[employer] Inc[employer]           Hysterectomy Information A hysterectomy is a surgery to remove your uterus. After surgery, you will no longer have periods. Also, you will not be able to get pregnant. Reasons for this surgery  You have bleeding that is not normal and keeps coming back.  You have lasting (chronic) lower belly (pelvic) pain.  You have a lasting infection.  The lining of your uterus grows outside your uterus.  The lining of your uterus grows in the muscle of your uterus.  Your uterus falls down into your vagina.  You have a growth in your uterus that causes problems.  You have cells that could turn into cancer (precancerous cells).  You have cancer of the uterus or cervix. Types There are 3 types of hysterectomies. Depending on the type, the surgery will:  Remove the top part of the uterus only.  Remove the uterus and the cervix.  Remove the uterus, cervix, and tissue that holds the uterus in place in the lower belly.  Ways a hysterectomy can  be performed There are 5 ways this surgery can be performed.  A cut (incision) is made in the belly (abdomen). The uterus is taken out through the cut.  A cut is made in the vagina. The uterus is taken out through the cut.  Three or four cuts are made in the belly. A surgical device with a camera is put through one of the cuts. The uterus is cut into small pieces. The uterus is taken out through the cuts or the vagina.  Three or four cuts are made in the belly. A surgical device with a camera is put through one of the cuts. The uterus is taken out through the vagina.  Three or four cuts are made in the belly. A surgical device that is controlled by a computer makes a visual image. The device helps the surgeon control the surgical tools. The uterus is cut into small pieces. The pieces are taken out through the cuts or through the vagina.  What can I expect after the surgery?  You will be given pain medicine.  You will need help at home for 3-5 days after surgery.  You will need to see your   doctor in 2-4 weeks after surgery.  You may get hot flashes, have night sweats, and have trouble sleeping.  You may need to have Pap tests in the future if your surgery was related to cancer. Talk to your doctor. It is still good to have regular exams. This information is not intended to replace advice given to you by your health care provider. Make sure you discuss any questions you have with your health care provider. Document Released: 01/03/2012 Document Revised: 03/18/2016 Document Reviewed: 06/18/2013 Elsevier Interactive Patient Education  2018 Elsevier Inc.  

## 2017-11-15 ENCOUNTER — Encounter: Payer: Self-pay | Admitting: *Deleted

## 2017-11-17 ENCOUNTER — Other Ambulatory Visit: Payer: Self-pay | Admitting: Internal Medicine

## 2017-11-21 ENCOUNTER — Ambulatory Visit (HOSPITAL_COMMUNITY)
Admission: RE | Admit: 2017-11-21 | Discharge: 2017-11-21 | Disposition: A | Discharge status: Home / Self Care | Payer: 59 | Source: Ambulatory Visit | Attending: Obstetrics & Gynecology | Admitting: Obstetrics & Gynecology

## 2017-11-21 DIAGNOSIS — N939 Abnormal uterine and vaginal bleeding, unspecified: Secondary | ICD-10-CM

## 2017-11-21 DIAGNOSIS — D259 Leiomyoma of uterus, unspecified: Secondary | ICD-10-CM | POA: Insufficient documentation

## 2017-11-28 ENCOUNTER — Encounter: Payer: Self-pay | Admitting: Obstetrics & Gynecology

## 2017-12-12 ENCOUNTER — Ambulatory Visit: Payer: 59 | Admitting: Obstetrics & Gynecology

## 2017-12-12 ENCOUNTER — Encounter: Payer: Self-pay | Admitting: Obstetrics & Gynecology

## 2017-12-12 ENCOUNTER — Encounter (HOSPITAL_COMMUNITY): Payer: Self-pay

## 2017-12-12 VITALS — BP 124/70 | HR 84 | Wt 255.7 lb

## 2017-12-12 DIAGNOSIS — Z21 Asymptomatic human immunodeficiency virus [HIV] infection status: Secondary | ICD-10-CM

## 2017-12-12 DIAGNOSIS — N939 Abnormal uterine and vaginal bleeding, unspecified: Secondary | ICD-10-CM | POA: Diagnosis not present

## 2017-12-12 DIAGNOSIS — B20 Human immunodeficiency virus [HIV] disease: Secondary | ICD-10-CM

## 2017-12-12 NOTE — Progress Notes (Signed)
History:  42 y.o. G0P0000 here today for AUB- heavy menses assoc with pain and clots. The sx are improved with Megace. The [place] and endo bx were WNL. Pt wants definitve treatment and does not wish to remain on Megace long term.     The following portions of the patient's history were reviewed and updated as appropriate: allergies, current medications, past family history, past medical history, past social history, past surgical history and problem list.  Review of Systems:  Pertinent items are noted in HPI.   Objective:  Physical Exam Blood pressure 124/70, pulse 84, weight 255 lb 11.2 oz (116 kg).  CONSTITUTIONAL: Well-developed, well-nourished female in no acute distress.  HENT:  Normocephalic, atraumatic EYES: Conjunctivae and EOM are normal. No scleral icterus.  NECK: Normal range of motion SKIN: Skin is warm and dry. No rash noted. Not diaphoretic.No pallor. [place]: Alert and oriented to person, place, and time. Normal coordination.   Abd: Soft, nontender and nondistended Pelvic: Normal appearing external genitalia; normal appearing vaginal mucosa and cervix.  Normal discharge.  Small uterus, no other palpable masses, no uterine or adnexal tenderness. Pt has decent descensus   Labs and Imaging US Pelvis Transvanginal Non-ob (tv Only)  Result Date: 11/21/2017 CLINICAL DATA:  Abnormal uterine bleeding. EXAM: TRANSABDOMINAL AND TRANSVAGINAL ULTRASOUND OF PELVIS TECHNIQUE: Both transabdominal and transvaginal ultrasound examinations of the pelvis were performed. Transabdominal technique was performed for global imaging of the pelvis including uterus, ovaries, adnexal regions, and pelvic cul-de-sac. It was necessary to proceed with endovaginal exam following the transabdominal exam to visualize the endometrium and ovaries. COMPARISON:  None FINDINGS: Uterus Measurements: 9.9 x 5.8 x 6.9 cm. Three fibroids are identified. The largest arises from the posterior myometrium on the left measuring  2 x 1.1 x 1.7 cm. This may be partially submucosal. Within the uterine fundus there is a fibroid measuring 1 x 0.5 x 0.7 cm. In the lower uterine segment there is an intramural fibroid identified posteriorly measuring 2.0 x 0.9 x 1.0 cm. Endometrium Thickness: 5.4 mm.  No focal abnormality visualized. Right ovary Measurements: 3.8 x 2.6 x 2.7 cm. Normal appearance/no adnexal mass. Left ovary Measurements: 2.4 x 1.2 x 2.1 cm. Normal appearance/no adnexal mass. Other findings No abnormal free fluid. IMPRESSION: 1. No acute findings. 2. Normal appearance of the endometrium measuring 5.4 mm. 3. Small posterior uterine fibroid which may be partially submucosal measuring 2 cm. Electronically Signed   By: Kerby Moors M.D.   On: 11/21/2017 12:23   US Pelvis (transabdominal Only)  Result Date: 11/21/2017 CLINICAL DATA:  Abnormal uterine bleeding. EXAM: TRANSABDOMINAL AND TRANSVAGINAL ULTRASOUND OF PELVIS TECHNIQUE: Both transabdominal and transvaginal ultrasound examinations of the pelvis were performed. Transabdominal technique was performed for global imaging of the pelvis including uterus, ovaries, adnexal regions, and pelvic cul-de-sac. It was necessary to proceed with endovaginal exam following the transabdominal exam to visualize the endometrium and ovaries. COMPARISON:  None FINDINGS: Uterus Measurements: 9.9 x 5.8 x 6.9 cm. Three fibroids are identified. The largest arises from the posterior myometrium on the left measuring 2 x 1.1 x 1.7 cm. This may be partially submucosal. Within the uterine fundus there is a fibroid measuring 1 x 0.5 x 0.7 cm. In the lower uterine segment there is an intramural fibroid identified posteriorly measuring 2.0 x 0.9 x 1.0 cm. Endometrium Thickness: 5.4 mm.  No focal abnormality visualized. Right ovary Measurements: 3.8 x 2.6 x 2.7 cm. Normal appearance/no adnexal mass. Left ovary Measurements: 2.4 x 1.2 x 2.1  cm. Normal appearance/no adnexal mass. Other findings No abnormal free  fluid. IMPRESSION: 1. No acute findings. 2. Normal appearance of the endometrium measuring 5.4 mm. 3. Small posterior uterine fibroid which may be partially submucosal measuring 2 cm. Electronically Signed   By: Kerby Moors M.D.   On: 11/21/2017 12:23   11/14/2017 Diagnosis Endometrium, biopsy - MIXED PHASE ENDOMETRIUM WITH BREAKDOWN. - NO HYPERPLASIA OR MALIGNANCY. Assessment & Plan:  AUB- menorrhagia and pain. Pt has a submucosal fibroid. She declines a hysteroscopic resection with  endometrial ablation and at her age I can't guarantee that this will provider her with the definitive treatment that she desires. I have reviewed her surgical option and she requests a TVH.   Reviewed results of Endobx   Patient desires surgical management with TVH with bilateral salpingectomy.  The risks of surgery were discussed in detail with the patient including but not limited to: bleeding which may require transfusion or reoperation; infection which may require prolonged hospitalization or re-hospitalization and antibiotic therapy; injury to bowel, bladder, ureters and major vessels or other surrounding organs; need for additional procedures including laparotomy; thromboembolic phenomenon, incisional problems and other postoperative or anesthesia complications.  Patient was told that the likelihood that her condition and symptoms will be treated effectively with this surgical management was very high; the postoperative expectations were also discussed in detail. The patient also understands the alternative treatment options which were discussed in full. All questions were answered.  She was told that she will be contacted by our surgical scheduler regarding the time and date of her surgery; routine preoperative instructions of having nothing to eat or drink after midnight on the day prior to surgery and also coming to the hospital 1 1/2 hours prior to her time of surgery were also emphasized.  She was told she may  be called for a preoperative appointment about a week prior to surgery and will be given further preoperative instructions at that visit. Printed patient education handouts about the procedure were given to the patient to review at home.  Total face-to-face time with patient was 20 min.  Greater than 50% was spent in counseling and coordination of care with the patient.     Jackelyne Sayer L. Harraway-Smith, M.D., [facility] June

## 2017-12-12 NOTE — Patient Instructions (Signed)
Vaginal Hysterectomy A vaginal hysterectomy is a procedure to remove all or part of the uterus through a small incision in the vagina. In this procedure, your health care provider may remove your entire uterus, including the lower end (cervix). You may need a vaginal hysterectomy to treat:  Uterine fibroids.  A condition that causes the lining of the uterus to grow in other areas (endometriosis).  Problems with pelvic support.  Cancer of the cervix, ovaries, uterus, or tissue that lines the uterus (endometrium).  Excessive (dysfunctional) uterine bleeding.  When removing your uterus, your health care provider may also remove the organs that produce eggs (ovaries) and the tubes that carry eggs to your uterus (fallopian tubes). After a vaginal hysterectomy, you will no longer be able to have a baby. You will also no longer get your menstrual period. Tell a health care provider about:  Any allergies you have.  All medicines you are taking, including vitamins, herbs, eye drops, creams, and over-the-counter medicines.  Any problems you or family members have had with anesthetic medicines.  Any blood disorders you have.  Any surgeries you have had.  Any medical conditions you have.  Whether you are pregnant or may be pregnant. What are the risks? Generally, this is a safe procedure. However, problems may occur, including:  Bleeding.  Infection.  A blood clot that forms in your leg and travels to your lungs (pulmonary embolism).  Damage to surrounding organs.  Pain during sex.  What happens before the procedure?  Ask your health care provider what organs will be removed during surgery.  Ask your health care provider about: ? Changing or stopping your regular medicines. This is especially important if you are taking diabetes medicines or blood thinners. ? Taking medicines such as aspirin and ibuprofen. These medicines can thin your blood. Do not take these medicines before  your procedure if your health care provider instructs you not to.  Follow instructions from your health care provider about eating or drinking restrictions.  Do not use any tobacco products, such as cigarettes, chewing tobacco, and e-cigarettes. If you need help quitting, ask your health care provider.  Plan to have someone take you home after discharge from the hospital. What happens during the procedure?  To reduce your risk of infection: ? Your health care team will wash or sanitize their hands. ? Your skin will be washed with soap.  An IV tube will be inserted into one of your veins.  You may be given antibiotic medicine to help prevent infection.  You will be given one or more of the following: ? A medicine to help you relax (sedative). ? A medicine to numb the area (local anesthetic). ? A medicine to make you fall asleep (general anesthetic). ? A medicine that is injected into an area of your body to numb everything beyond the injection site (regional anesthetic).  Your surgeon will make an incision in your vagina.  Your surgeon will locate and remove all or part of your uterus.  Your ovaries and fallopian tubes may be removed at the same time.  The incision will be closed with stitches (sutures) that dissolve over time. The procedure may vary among health care providers and hospitals. What happens after the procedure?  Your blood pressure, heart rate, breathing rate, and blood oxygen level will be monitored often until the medicines you were given have worn off.  You will be encouraged to get up and walk around after a few hours to help prevent   complications.  You may have IV tubes in place for a few days.  You will be given pain medicine as needed.  Do not drive for 24 hours if you were given a sedative. This information is not intended to replace advice given to you by your health care provider. Make sure you discuss any questions you have with your health care  provider. Document Released: 02/02/2016 Document Revised: 03/18/2016 Document Reviewed: 10/26/2015 Elsevier Interactive Patient Education  2018 [employer]  Vaginal Hysterectomy, Care After Refer to this sheet in the next few weeks. These instructions provide you with information about caring for yourself after your procedure. Your health care provider may also give you more specific instructions. Your treatment has been planned according to current medical practices, but problems sometimes occur. Call your health care provider if you have any problems or questions after your procedure. What can I expect after the procedure? After the procedure, it is common to have:  Pain.  Soreness and numbness in your incision areas.  Vaginal bleeding and discharge.  Constipation.  Temporary problems emptying the bladder.  Feelings of sadness or other emotions.  Follow these instructions at home: Medicines  Take over-the-counter and prescription medicines only as told by your health care provider.  If you were prescribed an antibiotic medicine, take it as told by your health care provider. Do not stop taking the antibiotic even if you start to feel better.  Do not drive or operate heavy machinery while taking prescription pain medicine. Activity  Return to your normal activities as told by your health care provider. Ask your health care provider what activities are safe for you.  Get regular exercise as told by your health care provider. You may be told to take short walks every day and go farther each time.  Do not lift anything that is heavier than 10 lb (4.5 kg). General instructions   Do not put anything in your vagina for 6 weeks after your surgery or as told by your health care provider. This includes tampons and douches.  Do not have sex until your health care provider says you can.  Do not take baths, swim, or use a hot tub until your health care provider approves.  Drink  enough fluid to keep your urine clear or pale yellow.  Do not drive for 24 hours if you were given a sedative.  Keep all follow-up visits as told by your health care provider. This is important. Contact a health care provider if:  Your pain medicine is not helping.  You have a fever.  You have redness, swelling, or pain at your incision site.  You have blood, pus, or a bad-smelling discharge from your vagina.  You continue to have difficulty urinating. Get help right away if:  You have severe abdominal or back pain.  You have heavy bleeding from your vagina.  You have chest pain or shortness of breath. This information is not intended to replace advice given to you by your health care provider. Make sure you discuss any questions you have with your health care provider. Document Released: 02/02/2016 Document Revised: 03/18/2016 Document Reviewed: 10/26/2015 Elsevier Interactive Patient Education  2018 Elsevier Inc.  

## 2017-12-19 NOTE — Patient Instructions (Addendum)
Your procedure is scheduled on:  Tuesday, March 12  Enter through the Main Entrance of [facility] at: 6 am  Pick up the phone at the desk and dial [phone].  Call this number if you have problems the morning of surgery: [phone].  Remember: Do NOT eat or Do NOT drink clear liquids (including water) after midnight Monday  Take these medicines the morning of surgery with a SIP OF WATER:  Genvoya, celexa, lipitor  Do Not smoke on the day of surgery.  Do NOT wear jewelry (body piercing), metal hair clips/bobby pins, make-up, or nail polish. Do NOT wear lotions, powders, or perfumes.  You may wear deoderant. Do NOT shave for 48 hours prior to surgery. Do NOT bring valuables to the hospital. Contacts may not be worn into surgery.  Leave suitcase in car.  After surgery it may be brought to your room.  For patients admitted to the hospital, checkout time is 11:00 AM the day of discharge. Have a responsible adult drive you home and stay with you for 24 hours after your procedure.  Home with Mother Hassan Rowan cell [phone] or Brother Randall Hiss cell [phone]

## 2017-12-23 ENCOUNTER — Encounter (HOSPITAL_COMMUNITY)
Admission: RE | Admit: 2017-12-23 | Discharge: 2017-12-23 | Disposition: A | Discharge status: Home / Self Care | Payer: 59 | Source: Ambulatory Visit | Attending: Obstetrics & Gynecology | Admitting: Obstetrics & Gynecology

## 2017-12-23 ENCOUNTER — Other Ambulatory Visit: Payer: Self-pay

## 2017-12-23 ENCOUNTER — Encounter (HOSPITAL_COMMUNITY): Payer: Self-pay

## 2017-12-23 DIAGNOSIS — Z01812 Encounter for preprocedural laboratory examination: Secondary | ICD-10-CM | POA: Diagnosis not present

## 2017-12-23 HISTORY — DX: Other seasonal allergic rhinitis: J30.2

## 2017-12-23 LAB — CBC
HCT: 39.5 % (ref 36.0–46.0)
HEMOGLOBIN: 13.3 g/dL (ref 12.0–15.0)
MCH: 31.7 pg (ref 26.0–34.0)
MCHC: 33.7 g/dL (ref 30.0–36.0)
MCV: 94 fL (ref 78.0–100.0)
Platelets: 287 10*3/uL (ref 150–400)
RBC: 4.2 MIL/uL (ref 3.87–5.11)
RDW: 13.2 % (ref 11.5–15.5)
WBC: 9.1 10*3/uL (ref 4.0–10.5)

## 2017-12-23 LAB — TYPE AND SCREEN
ABO/RH(D): O POS
Antibody Screen: NEGATIVE

## 2018-01-02 ENCOUNTER — Other Ambulatory Visit: Payer: Self-pay | Admitting: Internal Medicine

## 2018-01-02 ENCOUNTER — Encounter: Payer: Self-pay | Admitting: Infectious Disease

## 2018-01-02 DIAGNOSIS — B20 Human immunodeficiency virus [HIV] disease: Secondary | ICD-10-CM

## 2018-01-02 MED ORDER — GENTAMICIN SULFATE 40 MG/ML IJ SOLN
INTRAVENOUS | Status: DC
  Filled 2018-01-02: qty 10.5

## 2018-01-02 NOTE — Telephone Encounter (Signed)
Received call that patient had zero refills on Genvoya and had her last dose last night. I sent e script to [facility] just now.   If she cannot pick up tonight I asked her to call back in am.  Encouraged her to engage with my chart since she had great difficulty getting through phone lines

## 2018-01-02 NOTE — Anesthesia Preprocedure Evaluation (Addendum)
Anesthesia Evaluation  Patient identified by MRN, date of birth, ID band Patient awake    Reviewed: Allergy & Precautions, H&P , NPO status , Patient's Chart, lab work & pertinent test results  Airway Mallampati: II  TM Distance: >3 FB Neck ROM: Full    Dental no notable dental hx.    Pulmonary neg pulmonary ROS, Current Smoker,    Pulmonary exam normal breath sounds clear to auscultation       Cardiovascular negative cardio ROS Normal cardiovascular exam Rhythm:Regular Rate:Normal     Neuro/Psych  Headaches,    GI/Hepatic Neg liver ROS,   Endo/Other  negative endocrine ROS  Renal/GU      Musculoskeletal   Abdominal (+) + obese,   Peds  Hematology  (+) HIV,   Anesthesia Other Findings   Reproductive/Obstetrics                             Lab Results  Component Value Date   WBC 9.1 12/23/2017   HGB 13.3 12/23/2017   HCT 39.5 12/23/2017   MCV 94.0 12/23/2017   PLT 287 12/23/2017   Lab Results  Component Value Date   CREATININE 1.00 08/30/2017   BUN 11 08/30/2017   NA 138 08/30/2017   K 4.0 08/30/2017   CL 103 08/30/2017   CO2 27 08/30/2017     Anesthesia Physical Anesthesia Plan  ASA: III  Anesthesia Plan: General   Post-op Pain Management:    Induction: Intravenous  PONV Risk Score and Plan: Treatment may vary due to age or medical condition  Airway Management Planned: Oral ETT  Additional Equipment:   Intra-op Plan:   Post-operative Plan:   Informed Consent: I have reviewed the patients History and Physical, chart, labs and discussed the procedure including the risks, benefits and alternatives for the proposed anesthesia with the patient or authorized representative who has indicated his/her understanding and acceptance.   Dental advisory given  Plan Discussed with: CRNA  Anesthesia Plan Comments:         Anesthesia Quick Evaluation

## 2018-01-03 ENCOUNTER — Observation Stay (HOSPITAL_COMMUNITY)
Admission: RE | Admit: 2018-01-03 | Discharge: 2018-01-04 | Disposition: A | Discharge status: Home / Self Care | Payer: 59 | Source: Ambulatory Visit | Attending: Obstetrics & Gynecology | Admitting: Obstetrics & Gynecology

## 2018-01-03 ENCOUNTER — Encounter (HOSPITAL_COMMUNITY)
Admission: RE | Disposition: A | Discharge status: Home / Self Care | Payer: Self-pay | Source: Ambulatory Visit | Attending: Obstetrics & Gynecology

## 2018-01-03 ENCOUNTER — Ambulatory Visit (HOSPITAL_COMMUNITY): Payer: 59 | Admitting: Anesthesiology

## 2018-01-03 ENCOUNTER — Other Ambulatory Visit: Payer: Self-pay

## 2018-01-03 ENCOUNTER — Encounter (HOSPITAL_COMMUNITY): Payer: Self-pay | Admitting: Emergency Medicine

## 2018-01-03 DIAGNOSIS — G40909 Epilepsy, unspecified, not intractable, without status epilepticus: Secondary | ICD-10-CM | POA: Diagnosis not present

## 2018-01-03 DIAGNOSIS — Z87892 Personal history of anaphylaxis: Secondary | ICD-10-CM | POA: Diagnosis not present

## 2018-01-03 DIAGNOSIS — F411 Generalized anxiety disorder: Secondary | ICD-10-CM | POA: Diagnosis not present

## 2018-01-03 DIAGNOSIS — E669 Obesity, unspecified: Secondary | ICD-10-CM | POA: Diagnosis not present

## 2018-01-03 DIAGNOSIS — Z880 Allergy status to penicillin: Secondary | ICD-10-CM | POA: Insufficient documentation

## 2018-01-03 DIAGNOSIS — D251 Intramural leiomyoma of uterus: Secondary | ICD-10-CM | POA: Insufficient documentation

## 2018-01-03 DIAGNOSIS — N939 Abnormal uterine and vaginal bleeding, unspecified: Principal | ICD-10-CM | POA: Insufficient documentation

## 2018-01-03 DIAGNOSIS — F17210 Nicotine dependence, cigarettes, uncomplicated: Secondary | ICD-10-CM | POA: Insufficient documentation

## 2018-01-03 DIAGNOSIS — Z882 Allergy status to sulfonamides status: Secondary | ICD-10-CM | POA: Insufficient documentation

## 2018-01-03 DIAGNOSIS — Z6841 Body Mass Index (BMI) 40.0 and over, adult: Secondary | ICD-10-CM | POA: Diagnosis not present

## 2018-01-03 DIAGNOSIS — E7800 Pure hypercholesterolemia, unspecified: Secondary | ICD-10-CM | POA: Diagnosis not present

## 2018-01-03 DIAGNOSIS — N920 Excessive and frequent menstruation with regular cycle: Secondary | ICD-10-CM | POA: Diagnosis present

## 2018-01-03 DIAGNOSIS — K219 Gastro-esophageal reflux disease without esophagitis: Secondary | ICD-10-CM | POA: Insufficient documentation

## 2018-01-03 DIAGNOSIS — Z79899 Other long term (current) drug therapy: Secondary | ICD-10-CM | POA: Diagnosis not present

## 2018-01-03 DIAGNOSIS — Z9889 Other specified postprocedural states: Secondary | ICD-10-CM

## 2018-01-03 DIAGNOSIS — B20 Human immunodeficiency virus [HIV] disease: Secondary | ICD-10-CM | POA: Insufficient documentation

## 2018-01-03 DIAGNOSIS — D259 Leiomyoma of uterus, unspecified: Secondary | ICD-10-CM | POA: Diagnosis present

## 2018-01-03 HISTORY — PX: VAGINAL HYSTERECTOMY: SHX2639

## 2018-01-03 LAB — CBC
HCT: 36.7 % (ref 36.0–46.0)
Hemoglobin: 12.3 g/dL (ref 12.0–15.0)
MCH: 31.5 pg (ref 26.0–34.0)
MCHC: 33.5 g/dL (ref 30.0–36.0)
MCV: 94.1 fL (ref 78.0–100.0)
PLATELETS: 251 10*3/uL (ref 150–400)
RBC: 3.9 MIL/uL (ref 3.87–5.11)
RDW: 13.5 % (ref 11.5–15.5)
WBC: 12.6 10*3/uL — AB (ref 4.0–10.5)

## 2018-01-03 LAB — PREGNANCY, URINE: Preg Test, Ur: NEGATIVE

## 2018-01-03 LAB — CREATININE, SERUM
Creatinine, Ser: 1.11 mg/dL — ABNORMAL HIGH (ref 0.44–1.00)
GFR calc non Af Amer: >60 mL/min (ref >60)

## 2018-01-03 SURGERY — HYSTERECTOMY, VAGINAL
Anesthesia: General | Site: Vagina | Laterality: Bilateral

## 2018-01-03 MED ORDER — ACETAMINOPHEN 10 MG/ML IV SOLN
1000.0000 mg | Freq: Once | INTRAVENOUS | Status: DC | PRN
  Administered 2018-01-03: 1000 mg via INTRAVENOUS

## 2018-01-03 MED ORDER — IBUPROFEN 600 MG PO TABS
600.0000 mg | ORAL_TABLET | Freq: Four times a day (QID) | ORAL | Status: DC | PRN
  Administered 2018-01-04: 600 mg via ORAL
  Filled 2018-01-03: qty 1

## 2018-01-03 MED ORDER — ENOXAPARIN SODIUM 40 MG/0.4ML ~~LOC~~ SOLN
40.0000 mg | SUBCUTANEOUS | Status: DC
  Filled 2018-01-03: qty 0.4

## 2018-01-03 MED ORDER — MENTHOL 3 MG MT LOZG: 1.0000 | LOZENGE | OROMUCOSAL | Status: DC | PRN

## 2018-01-03 MED ORDER — SCOPOLAMINE 1 MG/3DAYS TD PT72
1.0000 | MEDICATED_PATCH | Freq: Once | TRANSDERMAL | Status: DC
  Administered 2018-01-03: 1.5 mg via TRANSDERMAL

## 2018-01-03 MED ORDER — ONDANSETRON HCL 4 MG/2ML IJ SOLN
INTRAMUSCULAR | Status: AC
  Filled 2018-01-03: qty 2

## 2018-01-03 MED ORDER — PANTOPRAZOLE SODIUM 40 MG PO TBEC: 40.0000 mg | DELAYED_RELEASE_TABLET | Freq: Every day | ORAL | Status: DC

## 2018-01-03 MED ORDER — SUGAMMADEX SODIUM 200 MG/2ML IV SOLN
INTRAVENOUS | Status: AC
  Filled 2018-01-03: qty 2

## 2018-01-03 MED ORDER — DEXTROSE 5 % IV SOLN: INTRAVENOUS | Status: DC | PRN

## 2018-01-03 MED ORDER — SOD CITRATE-CITRIC ACID 500-334 MG/5ML PO SOLN
30.0000 mL | ORAL | Status: AC
  Administered 2018-01-03: 30 mL via ORAL

## 2018-01-03 MED ORDER — DEXAMETHASONE SODIUM PHOSPHATE 10 MG/ML IJ SOLN
INTRAMUSCULAR | Status: DC | PRN
  Administered 2018-01-03: 10 mg via INTRAVENOUS

## 2018-01-03 MED ORDER — MEPERIDINE HCL 25 MG/ML IJ SOLN: 6.2500 mg | INTRAMUSCULAR | Status: DC | PRN

## 2018-01-03 MED ORDER — ONDANSETRON HCL 4 MG PO TABS
4.0000 mg | ORAL_TABLET | Freq: Four times a day (QID) | ORAL | Status: DC | PRN
Start: 2018-01-03 — End: 2018-01-04

## 2018-01-03 MED ORDER — OXYCODONE-ACETAMINOPHEN 5-325 MG PO TABS: 1.0000 | ORAL_TABLET | ORAL | Status: DC | PRN

## 2018-01-03 MED ORDER — HYDROMORPHONE HCL 1 MG/ML IJ SOLN
0.2500 mg | INTRAMUSCULAR | Status: DC | PRN
  Administered 2018-01-03 (×3): 0.5 mg via INTRAVENOUS

## 2018-01-03 MED ORDER — ELVITEG-COBIC-EMTRICIT-TENOFAF 150-150-200-10 MG PO TABS
1.0000 | ORAL_TABLET | Freq: Every day | ORAL | Status: DC
  Filled 2018-01-03: qty 1

## 2018-01-03 MED ORDER — FENTANYL CITRATE (PF) 100 MCG/2ML IJ SOLN
INTRAMUSCULAR | Status: DC | PRN
  Administered 2018-01-03: 50 ug via INTRAVENOUS
  Administered 2018-01-03: 100 ug via INTRAVENOUS
  Administered 2018-01-03: 50 ug via INTRAVENOUS
  Administered 2018-01-03: 100 ug via INTRAVENOUS

## 2018-01-03 MED ORDER — PROPOFOL 10 MG/ML IV BOLUS
INTRAVENOUS | Status: AC
  Filled 2018-01-03: qty 20

## 2018-01-03 MED ORDER — SOD CITRATE-CITRIC ACID 500-334 MG/5ML PO SOLN
ORAL | Status: AC
  Administered 2018-01-03: 30 mL via ORAL
  Filled 2018-01-03: qty 15

## 2018-01-03 MED ORDER — GENTAMICIN SULFATE 40 MG/ML IJ SOLN
Freq: Once | INTRAMUSCULAR | Status: AC
  Administered 2018-01-03: 100 mL via INTRAVENOUS
  Filled 2018-01-03: qty 10.5

## 2018-01-03 MED ORDER — CLINDAMYCIN PHOSPHATE 900 MG/6ML IJ SOLN: INTRAMUSCULAR | Status: DC | PRN

## 2018-01-03 MED ORDER — MIDAZOLAM HCL 2 MG/2ML IJ SOLN
INTRAMUSCULAR | Status: DC | PRN
  Administered 2018-01-03: 2 mg via INTRAVENOUS

## 2018-01-03 MED ORDER — CITALOPRAM HYDROBROMIDE 40 MG PO TABS
40.0000 mg | ORAL_TABLET | Freq: Every day | ORAL | Status: DC
  Filled 2018-01-03: qty 1

## 2018-01-03 MED ORDER — FENTANYL CITRATE (PF) 250 MCG/5ML IJ SOLN
INTRAMUSCULAR | Status: AC
  Filled 2018-01-03: qty 5

## 2018-01-03 MED ORDER — LIDOCAINE HCL (CARDIAC) 20 MG/ML IV SOLN
INTRAVENOUS | Status: DC | PRN
  Administered 2018-01-03: 100 mg via INTRAVENOUS

## 2018-01-03 MED ORDER — PROMETHAZINE HCL 25 MG/ML IJ SOLN: 6.2500 mg | INTRAMUSCULAR | Status: DC | PRN

## 2018-01-03 MED ORDER — SIMETHICONE 80 MG PO CHEW: 80.0000 mg | CHEWABLE_TABLET | Freq: Four times a day (QID) | ORAL | Status: DC | PRN

## 2018-01-03 MED ORDER — DEXTROSE-NACL 5-0.45 % IV SOLN
INTRAVENOUS | Status: AC
  Administered 2018-01-03: 18:00:00 via INTRAVENOUS

## 2018-01-03 MED ORDER — SODIUM CHLORIDE 0.9 % IJ SOLN
INTRAMUSCULAR | Status: AC
  Filled 2018-01-03: qty 50

## 2018-01-03 MED ORDER — EPHEDRINE SULFATE 50 MG/ML IJ SOLN
INTRAMUSCULAR | Status: DC | PRN
  Administered 2018-01-03: 5 mg via INTRAVENOUS

## 2018-01-03 MED ORDER — KETOROLAC TROMETHAMINE 30 MG/ML IJ SOLN
INTRAMUSCULAR | Status: DC | PRN
  Administered 2018-01-03: 30 mg via INTRAVENOUS

## 2018-01-03 MED ORDER — KETOROLAC TROMETHAMINE 30 MG/ML IJ SOLN
30.0000 mg | Freq: Four times a day (QID) | INTRAMUSCULAR | Status: DC
  Filled 2018-01-03: qty 1

## 2018-01-03 MED ORDER — EPHEDRINE 5 MG/ML INJ
INTRAVENOUS | Status: AC
  Filled 2018-01-03: qty 10

## 2018-01-03 MED ORDER — CLINDAMYCIN PHOSPHATE 900 MG/50ML IV SOLN: INTRAVENOUS | Status: DC | PRN

## 2018-01-03 MED ORDER — VASOPRESSIN 20 UNIT/ML IV SOLN
INTRAVENOUS | Status: AC
  Filled 2018-01-03: qty 1

## 2018-01-03 MED ORDER — OXYCODONE-ACETAMINOPHEN 5-325 MG PO TABS
2.0000 | ORAL_TABLET | ORAL | Status: DC | PRN
  Administered 2018-01-03: 2 via ORAL
  Filled 2018-01-03: qty 2

## 2018-01-03 MED ORDER — HYDROCODONE-ACETAMINOPHEN 7.5-325 MG PO TABS: 1.0000 | ORAL_TABLET | Freq: Once | ORAL | Status: DC | PRN

## 2018-01-03 MED ORDER — FLEET ENEMA 7-19 GM/118ML RE ENEM: 1.0000 | ENEMA | Freq: Once | RECTAL | Status: DC | PRN

## 2018-01-03 MED ORDER — DOCUSATE SODIUM 100 MG PO CAPS
100.0000 mg | ORAL_CAPSULE | Freq: Two times a day (BID) | ORAL | Status: DC
  Administered 2018-01-03: 100 mg via ORAL
  Filled 2018-01-03: qty 1

## 2018-01-03 MED ORDER — ROCURONIUM BROMIDE 100 MG/10ML IV SOLN
INTRAVENOUS | Status: AC
  Filled 2018-01-03: qty 1

## 2018-01-03 MED ORDER — HYDROMORPHONE HCL 1 MG/ML IJ SOLN
INTRAMUSCULAR | Status: AC
  Filled 2018-01-03: qty 0.5

## 2018-01-03 MED ORDER — LACTATED RINGERS IV SOLN
INTRAVENOUS | Status: DC
  Administered 2018-01-03 (×2): via INTRAVENOUS

## 2018-01-03 MED ORDER — HYDROMORPHONE HCL 1 MG/ML IJ SOLN
0.2000 mg | INTRAMUSCULAR | Status: DC | PRN
  Administered 2018-01-03 (×2): 0.6 mg via INTRAVENOUS
  Filled 2018-01-03 (×2): qty 1

## 2018-01-03 MED ORDER — DEXAMETHASONE SODIUM PHOSPHATE 10 MG/ML IJ SOLN
INTRAMUSCULAR | Status: AC
  Filled 2018-01-03: qty 1

## 2018-01-03 MED ORDER — LIDOCAINE HCL (CARDIAC) 20 MG/ML IV SOLN
INTRAVENOUS | Status: AC
  Filled 2018-01-03: qty 5

## 2018-01-03 MED ORDER — PROPOFOL 10 MG/ML IV BOLUS
INTRAVENOUS | Status: DC | PRN
  Administered 2018-01-03: 250 mg via INTRAVENOUS

## 2018-01-03 MED ORDER — POLYETHYLENE GLYCOL 3350 17 G PO PACK: 17.0000 g | PACK | Freq: Every day | ORAL | Status: DC | PRN

## 2018-01-03 MED ORDER — LACTATED RINGERS IV SOLN: INTRAVENOUS | Status: DC

## 2018-01-03 MED ORDER — SUGAMMADEX SODIUM 200 MG/2ML IV SOLN
INTRAVENOUS | Status: DC | PRN
  Administered 2018-01-03: 236.8 mg via INTRAVENOUS

## 2018-01-03 MED ORDER — GENTAMICIN SULFATE 40 MG/ML IJ SOLN: INTRAVENOUS | Status: DC | PRN

## 2018-01-03 MED ORDER — ONDANSETRON HCL 4 MG/2ML IJ SOLN: 4.0000 mg | Freq: Four times a day (QID) | INTRAMUSCULAR | Status: DC | PRN

## 2018-01-03 MED ORDER — HYDROMORPHONE HCL 1 MG/ML IJ SOLN
INTRAMUSCULAR | Status: AC
  Administered 2018-01-03: 0.5 mg via INTRAVENOUS
  Filled 2018-01-03: qty 1

## 2018-01-03 MED ORDER — KETOROLAC TROMETHAMINE 30 MG/ML IJ SOLN
30.0000 mg | Freq: Four times a day (QID) | INTRAMUSCULAR | Status: DC
  Administered 2018-01-03 (×3): 30 mg via INTRAVENOUS
  Filled 2018-01-03 (×2): qty 1

## 2018-01-03 MED ORDER — ESTRADIOL 0.1 MG/GM VA CREA
TOPICAL_CREAM | VAGINAL | Status: DC | PRN
  Administered 2018-01-03: 1 via VAGINAL

## 2018-01-03 MED ORDER — ONDANSETRON HCL 4 MG/2ML IJ SOLN
INTRAMUSCULAR | Status: DC | PRN
  Administered 2018-01-03: 4 mg via INTRAVENOUS

## 2018-01-03 MED ORDER — BUPIVACAINE HCL (PF) 0.5 % IJ SOLN
INTRAMUSCULAR | Status: AC
  Filled 2018-01-03: qty 30

## 2018-01-03 MED ORDER — ACETAMINOPHEN 10 MG/ML IV SOLN
INTRAVENOUS | Status: AC
  Administered 2018-01-03: 1000 mg via INTRAVENOUS
  Filled 2018-01-03: qty 100

## 2018-01-03 MED ORDER — SCOPOLAMINE 1 MG/3DAYS TD PT72
MEDICATED_PATCH | TRANSDERMAL | Status: AC
  Administered 2018-01-03: 1.5 mg via TRANSDERMAL
  Filled 2018-01-03: qty 1

## 2018-01-03 MED ORDER — ROCURONIUM BROMIDE 100 MG/10ML IV SOLN
INTRAVENOUS | Status: DC | PRN
  Administered 2018-01-03: 50 mg via INTRAVENOUS
  Administered 2018-01-03: 10 mg via INTRAVENOUS

## 2018-01-03 MED ORDER — KETOROLAC TROMETHAMINE 30 MG/ML IJ SOLN
INTRAMUSCULAR | Status: AC
  Filled 2018-01-03: qty 1

## 2018-01-03 MED ORDER — SODIUM CHLORIDE 0.9 % IJ SOLN
INTRAMUSCULAR | Status: AC
  Filled 2018-01-03: qty 10

## 2018-01-03 MED ORDER — ESTRADIOL 0.1 MG/GM VA CREA
TOPICAL_CREAM | VAGINAL | Status: AC
  Filled 2018-01-03: qty 42.5

## 2018-01-03 MED ORDER — MIDAZOLAM HCL 2 MG/2ML IJ SOLN
INTRAMUSCULAR | Status: AC
  Filled 2018-01-03: qty 2

## 2018-01-03 MED ORDER — BISACODYL 10 MG RE SUPP: 10.0000 mg | Freq: Every day | RECTAL | Status: DC | PRN

## 2018-01-03 MED ORDER — TRAZODONE HCL 50 MG PO TABS
50.0000 mg | ORAL_TABLET | Freq: Every day | ORAL | Status: DC
  Filled 2018-01-03: qty 1

## 2018-01-03 MED ORDER — VASOPRESSIN 20 UNIT/ML IV SOLN
INTRAVENOUS | Status: DC | PRN
  Administered 2018-01-03: 30 mL via INTRAMUSCULAR

## 2018-01-03 SURGICAL SUPPLY — 25 items
CANISTER SUCT 3000ML PPV (MISCELLANEOUS) ×2 IMPLANT
CONT PATH 16OZ SNAP LID 3702 (MISCELLANEOUS) ×1 IMPLANT
DECANTER SPIKE VIAL GLASS SM (MISCELLANEOUS) ×1 IMPLANT
GAUZE PACKING 2X5 YD STRL (GAUZE/BANDAGES/DRESSINGS) IMPLANT
GLOVE BIO SURGEON STRL SZ7 (GLOVE) ×2 IMPLANT
GLOVE BIOGEL PI IND STRL 6.5 (GLOVE) ×1 IMPLANT
GLOVE BIOGEL PI IND STRL 7.0 (GLOVE) ×3 IMPLANT
GLOVE BIOGEL PI INDICATOR 6.5 (GLOVE) ×1
GLOVE BIOGEL PI INDICATOR 7.0 (GLOVE) ×3
GOWN STRL REUS W/TWL LRG LVL3 (GOWN DISPOSABLE) ×8 IMPLANT
GOWN STRL REUS W/TWL XL LVL3 (GOWN DISPOSABLE) ×2 IMPLANT
NDL SPNL 20GX3.5 QUINCKE YW (NEEDLE) IMPLANT
NEEDLE SPNL 20GX3.5 QUINCKE YW (NEEDLE) IMPLANT
NS IRRIG 1000ML POUR BTL (IV SOLUTION) ×2 IMPLANT
PACK TRENDGUARD 600 HYBRD PROC (MISCELLANEOUS) IMPLANT
PACK VAGINAL WOMENS (CUSTOM PROCEDURE TRAY) ×2 IMPLANT
PAD OB MATERNITY 4.3X12.25 (PERSONAL CARE ITEMS) ×2 IMPLANT
SHEARS FOC LG CVD HARMONIC 17C (MISCELLANEOUS) IMPLANT
SUT VIC AB 0 CT1 18XCR BRD8 (SUTURE) ×3 IMPLANT
SUT VIC AB 0 CT1 36 (SUTURE) ×4 IMPLANT
SUT VIC AB 0 CT1 8-18 (SUTURE) ×6
SUT VICRYL 0 TIES 12 18 (SUTURE) ×2 IMPLANT
TOWEL OR 17X24 6PK STRL BLUE (TOWEL DISPOSABLE) ×4 IMPLANT
TRAY FOLEY CATH SILVER 14FR (SET/KITS/TRAYS/PACK) ×2 IMPLANT
TRENDGUARD 600 HYBRID PROC PK (MISCELLANEOUS)

## 2018-01-03 NOTE — Transfer of Care (Signed)
Immediate Anesthesia Transfer of Care Note  Patient: Julie Fox  Procedure(s) Performed: HYSTERECTOMY VAGINAL WITH SALPINGECTOMY (Bilateral Vagina )  Patient Location: PACU  Anesthesia Type:General  Level of Consciousness: awake, alert  and oriented  Airway & Oxygen Therapy: Patient Spontanous Breathing and Patient connected to nasal cannula oxygen  Post-op Assessment: Report given to RN, Post -op Vital signs reviewed and stable and Patient moving all extremities  Post vital signs: Reviewed and stable  Last Vitals:  Vitals:   01/03/18 0715  BP: 125/78  Pulse: 91  Resp: 16  Temp: 36.9 C  SpO2: 98%    Last Pain:  Vitals:   01/03/18 0715  TempSrc: Oral  PainSc: 3       Patients Stated Pain Goal: 4 (61/68/37 2902)  Complications: No apparent anesthesia complications

## 2018-01-03 NOTE — Addendum Note (Signed)
Addendum  created 01/03/18 1738 by Hewitt Blade, CRNA   Sign clinical note

## 2018-01-03 NOTE — Anesthesia Postprocedure Evaluation (Signed)
Anesthesia Post Note  Patient: Julie Fox  Procedure(s) Performed: HYSTERECTOMY VAGINAL WITH SALPINGECTOMY (Bilateral Vagina )     Patient location during evaluation: PACU Anesthesia Type: General Level of consciousness: awake and alert Pain management: pain level controlled Vital Signs Assessment: post-procedure vital signs reviewed and stable Respiratory status: spontaneous breathing, nonlabored ventilation, respiratory function stable and patient connected to nasal cannula oxygen Cardiovascular status: blood pressure returned to baseline and stable Postop Assessment: no apparent nausea or vomiting Anesthetic complications: no    Last Vitals:  Vitals:   01/03/18 1320 01/03/18 1340  BP:  127/70  Pulse: 82 81  Resp: 18 18  Temp:  37 C  SpO2: 96% 97%    Last Pain:  Vitals:   01/03/18 1340  TempSrc:   PainSc: 4    Pain Goal: Patients Stated Pain Goal: 4 (01/03/18 0715)               Barnet Glasgow

## 2018-01-03 NOTE — H&P (Signed)
Preoperative History and Physical  Julie Fox is a 42 y.o. G0P0000 here for surgical management of AUB.   Proposed surgery: total vaginal hysterectomy with bilateral salpingectomy.  Past Medical History:  Diagnosis Date  . GAD (generalized anxiety disorder)   . GERD (gastroesophageal reflux disease)    no meds, diet controlled  . History of epilepsy    childhood seizures - last one age 78 yr, non since  . HIV (human immunodeficiency virus infection) ([place])   . Hx of abnormal cervical Pap smear    2012 per pt, then normal since   . Migraine    otc med prn  . Obesity   . Pure hypercholesterolemia   . Seasonal allergies    Past Surgical History:  Procedure Laterality Date  . CHOLECYSTECTOMY    . COLONOSCOPY    . TOE SURGERY     age 12-cyst excision per patient  . TONSILLECTOMY  2016  . UPPER GI ENDOSCOPY    . WISDOM TOOTH EXTRACTION     x 2 lower   OB History    Gravida Para Term Preterm AB Living   0 0 0 0 0 0   SAB TAB Ectopic Multiple Live Births   0 0 0 0 0     Patient denies any cervical dysplasia or STIs. Medications Prior to Admission  Medication Sig Dispense Refill Last Dose  . atorvastatin (LIPITOR) 20 MG tablet TAKE 1 TABLET(20 MG) BY MOUTH DAILY 30 tablet 5 01/03/2018 at 0515  . citalopram (CELEXA) 40 MG tablet Take 1 tablet (40 mg total) daily by mouth. 30 tablet 11 01/03/2018 at 0515  . GENVOYA 150-150-200-10 MG TABS tablet TAKE 1 TABLET BY MOUTH DAILY WITH BREAKFAST 30 tablet 11 01/03/2018 at 0515  . ketorolac (TORADOL) 10 MG tablet Take 1 tablet (10 mg total) by mouth every 6 (six) hours as needed. (Patient not taking: Reported on 11/14/2017) 20 tablet 0 Not Taking at Unknown time  . megestrol (MEGACE) 40 MG tablet Take 1 tablet (40 mg total) by mouth 2 (two) times daily. Continue medication until your follow appointment (Patient not taking: Reported on 12/15/2017) 60 tablet 3 Not Taking at Unknown time  . traZODone (DESYREL) 50 MG tablet TAKE 1  TABLET(50 MG) BY MOUTH AT BEDTIME 30 tablet 5 More than a month at Unknown time    Allergies  Allergen Reactions  . Penicillins Anaphylaxis    Has patient had a PCN reaction causing immediate rash, facial/tongue/throat swelling, SOB or lightheadedness with hypotension: yes Has patient had a PCN reaction causing severe rash involving mucus membranes or skin necrosis: no Has patient had a PCN reaction that required hospitalization: no Has patient had a PCN reaction occurring within the last 10 years: yes If all of the above answers are "NO", then may proceed with Cephalosporin use.   . Sulfa Antibiotics Rash   Social History:   reports that she has been smoking cigarettes.  She started smoking about 22 years ago. She has a 5.00 pack-year smoking history. she has never used smokeless tobacco. She reports that she does not drink alcohol or use drugs. Family History  Problem Relation Age of Onset  . Hypertension Mother   . Hyperlipidemia Mother   . Diabetes Mother   . Cancer Father   . Non-Hodgkin's lymphoma Maternal Grandmother   . Cancer Maternal Grandfather     Review of Systems: Noncontributory  PHYSICAL EXAM: Blood pressure 125/78, pulse 91, temperature 98.4 F (36.9 C), temperature source Oral,  resp. rate 16, height 5\' 7"  (1.702 m), weight 261 lb (118.4 kg), last menstrual period 12/26/2017, SpO2 98 %. General appearance - alert, well appearing, and in no distress Chest - clear to auscultation, no wheezes, rales or rhonchi, symmetric air entry Heart - normal rate and regular rhythm Abdomen - soft, nontender, nondistended, no masses or organomegaly Pelvic - examination not indicated Extremities - peripheral pulses normal, no pedal edema, no clubbing or cyanosis  Labs: Results for orders placed or performed during the hospital encounter of 12/23/17 (from the past 336 hour(s))  CBC   Collection Time: 12/23/17  1:25 PM  Result Value Ref Range   WBC 9.1 4.0 - 10.5 K/uL   RBC  4.20 3.87 - 5.11 MIL/uL   Hemoglobin 13.3 12.0 - 15.0 g/dL   HCT 39.5 36.0 - 46.0 %   MCV 94.0 78.0 - 100.0 fL   MCH 31.7 26.0 - 34.0 pg   MCHC 33.7 30.0 - 36.0 g/dL   RDW 13.2 11.5 - 15.5 %   Platelets 287 150 - 400 K/uL  Type and screen [place]   Collection Time: 12/23/17  1:25 PM  Result Value Ref Range   ABO/RH(D) O POS    Antibody Screen NEG    Sample Expiration 01/06/2018    Extend sample reason      NO TRANSFUSIONS OR PREGNANCY IN THE PAST 3 MONTHS Performed at [facility], [address], [place], [place] [address]     Imaging Studies: 11/21/2017 CLINICAL DATA:  Abnormal uterine bleeding.  EXAM: TRANSABDOMINAL AND TRANSVAGINAL ULTRASOUND OF PELVIS  TECHNIQUE: Both transabdominal and transvaginal ultrasound examinations of the pelvis were performed. Transabdominal technique was performed for global imaging of the pelvis including uterus, ovaries, adnexal regions, and pelvic cul-de-sac. It was necessary to proceed with endovaginal exam following the transabdominal exam to visualize the endometrium and ovaries.  COMPARISON:  None  FINDINGS: Uterus  Measurements: 9.9 x 5.8 x 6.9 cm. Three fibroids are identified. The largest arises from the posterior myometrium on the left measuring 2 x 1.1 x 1.7 cm. This may be partially submucosal. Within the uterine fundus there is a fibroid measuring 1 x 0.5 x 0.7 cm. In the lower uterine segment there is an intramural fibroid identified posteriorly measuring 2.0 x 0.9 x 1.0 cm.  Endometrium  Thickness: 5.4 mm.  No focal abnormality visualized.  Right ovary  Measurements: 3.8 x 2.6 x 2.7 cm. Normal appearance/no adnexal mass.  Left ovary  Measurements: 2.4 x 1.2 x 2.1 cm. Normal appearance/no adnexal mass.  Other findings  No abnormal free fluid.  IMPRESSION: 1. No acute findings. 2. Normal appearance of the endometrium measuring 5.4 mm. 3. Small posterior uterine  fibroid which may be partially submucosal measuring 2 cm.  Assessment: Patient Active Problem List   Diagnosis Date Noted  . Abnormal uterine bleeding (AUB) 12/12/2017  . GAD (generalized anxiety disorder) 06/13/2017  . Hyperlipidemia 06/13/2017  . HIV disease ([place]) 12/23/2016    Plan: Patient will undergo surgical management with total vaginal hysterectomy with bilateral salpingectomy.   The risks of surgery were discussed in detail with the patient including but not limited to: bleeding which may require transfusion or reoperation; infection which may require antibiotics; injury to surrounding organs which may involve bowel, bladder, ureters ; need for additional procedures including laparoscopy or laparotomy; thromboembolic phenomenon, surgical site problems and other postoperative/anesthesia complications. Likelihood of success in alleviating the patient's condition was discussed. Routine postoperative instructions will be reviewed  with the patient and her family in detail after surgery.  The patient concurred with the proposed plan, giving informed written consent for the surgery.  Patient has been NPO since last night she will remain NPO for procedure.  Anesthesia and OR aware.  Preoperative prophylactic antibiotics and SCDs ordered on call to the OR.  To OR when ready.  Joanette Silveria L. Ihor Dow, M.D., [facility] 01/03/2018 7:29 AM

## 2018-01-03 NOTE — Brief Op Note (Addendum)
01/03/2018  11:46 AM  PATIENT:  Julie Fox  42 y.o. female  PRE-OPERATIVE DIAGNOSIS:  AUB  POST-OPERATIVE DIAGNOSIS:  AUB  PROCEDURE:  Total vaginal hysterectomy  SURGEON:  Surgeon(s) and Role:    * Lavonia Drafts, MD - Primary    * Donnamae Jude, MD - Assisting  ANESTHESIA:   general  EBL:  200 mL   BLOOD ADMINISTERED:none  DRAINS: none   LOCAL MEDICATIONS USED:  OTHER dilute vasopressin solution   SPECIMEN:  Source of Specimen:  uterus and cervix   DISPOSITION OF SPECIMEN:  PATHOLOGY  COUNTS:  YES  TOURNIQUET:  * No tourniquets in log *  DICTATION: .Note written in EPIC  PLAN OF CARE: Admit for overnight observation  PATIENT DISPOSITION:  PACU - hemodynamically stable.   Delay start of Pharmacological VTE agent (>24hrs) due to surgical blood loss or risk of bleeding: yes  Complications: none immediate  Sacred Roa L. Harraway-Smith, M.D., [facility]

## 2018-01-03 NOTE — Anesthesia Postprocedure Evaluation (Signed)
Anesthesia Post Note  Patient: Julie Fox  Procedure(s) Performed: HYSTERECTOMY VAGINAL WITH SALPINGECTOMY (Bilateral Vagina )     Patient location during evaluation: [facility] Anesthesia Type: General Level of consciousness: awake and alert and oriented Pain management: pain level controlled Vital Signs Assessment: post-procedure vital signs reviewed and stable Respiratory status: spontaneous breathing and nonlabored ventilation Cardiovascular status: stable Postop Assessment: no apparent nausea or vomiting and adequate PO intake Anesthetic complications: no    Last Vitals:  Vitals:   01/03/18 1440 01/03/18 1543  BP: 127/77 120/72  Pulse: 75 74  Resp: 18 18  Temp: 36.7 C 36.6 C  SpO2: 95% 97%    Last Pain:  Vitals:   01/03/18 1720  TempSrc:   PainSc: 8    Pain Goal: Patients Stated Pain Goal: 3 (01/03/18 1720)               Jabier Mutton

## 2018-01-03 NOTE — Op Note (Addendum)
01/03/2018  11:46 AM  PATIENT:  Julie Fox  42 y.o. female  PRE-OPERATIVE DIAGNOSIS:  AUB  POST-OPERATIVE DIAGNOSIS:  AUB  PROCEDURE:  Procedure(s): HYSTERECTOMY VAGINAL   SURGEON:  Surgeon(s) and Role:    * Lavonia Drafts, MD - Primary    * Donnamae Jude, MD - Assisting  ANESTHESIA:   general  EBL:  200 mL   BLOOD ADMINISTERED:none  DRAINS: none   LOCAL MEDICATIONS USED:  OTHER dilute vasopressin solution   SPECIMEN:  Source of Specimen:  uterus and cervix   DISPOSITION OF SPECIMEN:  PATHOLOGY  COUNTS:  YES  TOURNIQUET:  * No tourniquets in log *  DICTATION: .Note written in EPIC  PLAN OF CARE: Admit for overnight observation  PATIENT DISPOSITION:  PACU - hemodynamically stable.   Delay start of Pharmacological VTE agent (>24hrs) due to surgical blood loss or risk of bleeding: yes  Complications: none immediate  INDICATIONS: The patient is a 42 y.o. G0P0000 with history of symptomatic uterine fibroids/menorrhagia. The patient made a decision to undergo definite surgical treatment. On the preoperative visit, the risks, benefits, indications, and alternatives of the procedure were reviewed with the patient.  On the day of surgery, the risks of surgery were again discussed with the patient including but not limited to: bleeding which may require transfusion or reoperation; infection which may require antibiotics; injury to bowel, bladder, ureters or other surrounding organs; need for additional procedures; thromboembolic phenomenon, incisional problems and other postoperative/anesthesia complications. Written informed consent was obtained.    OPERATIVE FINDINGS: A 10 week size uterus; fallopian tubes and ovaries not seen bilaterally.  DESCRIPTION OF PROCEDURE:  The patient received intravenous antibiotics and had sequential compression devices applied to her lower extremities while in the preoperative area.  She was then taken to the operating room  where general anesthesia was administered and was found to be adequate.  She was placed in the dorsal lithotomy position, and was prepped and draped in a sterile manner.  The patients bladder was drained with a red rubber catheter. After an adequate timeout was performed, attention was turned to her pelvis.  A weighted speculum was then placed in the vagina, and the anterior and posterior lips of the cervix were grasped bilaterally with tenaculums.  The cervix was then injected circumferentially with a dilute Vasopression solution.  The cervix was then circumferentially incised, and the bladder was dissected off the pubocervical fascia without complication.  Th posterior cul-de-sac was entered sharply without difficulty. A suture was placed on the posterior vagina.  A long weighted speculum was inserted into the posterior cul-de-sac.  The Heaney clamp was then used to clamp the uterosacral ligaments on either side.  They were then cut and sutured ligated with 0 Vicryl, and the ligated uterosacral ligaments were transfixed to the posterior lateral vaginal epithelium to further support the vagina and provide hemostasis. Of note, all sutures used in this case were 0 Vicryl unless otherwise noted.   The cardinal ligaments were then clamped, cut and ligated. The anterior cul-de-sac was then entered sharpely. The uterine vessels and broad ligaments were then serially clamped with the Heaney clamps, cut, and suture ligated on both sides.  Excellent hemostasis was noted at this point.  Due to the size of the uterus, it was morcellated using a coring technique.  The uterus was then delivered via the posterior cul-de-sac, and the cornua were clamped with the Heaney clamps, transected, and the uterus was delivered and sent to pathology. These  pedicles were then suture ligated to ensure hemostasis.  After completion of the hysterectomy, I attempted to located the fallopian tubes and the ovaries which were not seen on either  side. All pedicles from the uterosacral ligament to the cornua were examined hemostasis was confirmed.  The vaginal cuff was reefed in a running locked fashion then reapproximated using figure of eight sutures care was given to incorporate the uterosacral pedicles bilaterally.  All instruments were then removed from the pelvis and a vaginal packing saturated with estrogen cream was placed.  The patient tolerated the procedure well.  All instruments, needles, and sponge counts were correct x 2. The patient was taken to the recovery room in stable condition.    Neamiah Sciarra L. Harraway-Smith, M.D., Cherlynn June

## 2018-01-03 NOTE — Anesthesia Procedure Notes (Signed)
Procedure Name: Intubation Date/Time: 01/03/2018 9:36 AM Performed by: Hewitt Blade, CRNA Pre-anesthesia Checklist: Patient identified, Emergency Drugs available, Suction available and Patient being monitored Patient Re-evaluated:Patient Re-evaluated prior to induction Oxygen Delivery Method: Circle system utilized Preoxygenation: Pre-oxygenation with 100% oxygen Induction Type: IV induction Ventilation: Mask ventilation without difficulty Laryngoscope Size: Mac and 3 Grade View: Grade I Tube type: Oral Tube size: 7.0 mm Number of attempts: 1 Airway Equipment and Method: Stylet Placement Confirmation: ETT inserted through vocal cords under direct vision,  positive ETCO2 and breath sounds checked- equal and bilateral Secured at: 21 cm Tube secured with: Tape Dental Injury: Teeth and Oropharynx as per pre-operative assessment

## 2018-01-04 ENCOUNTER — Other Ambulatory Visit: Payer: Self-pay | Admitting: General Practice

## 2018-01-04 ENCOUNTER — Encounter: Payer: Self-pay | Admitting: Obstetrics & Gynecology

## 2018-01-04 ENCOUNTER — Encounter (HOSPITAL_COMMUNITY): Payer: Self-pay | Admitting: Obstetrics & Gynecology

## 2018-01-04 ENCOUNTER — Other Ambulatory Visit: Payer: Self-pay | Admitting: Obstetrics & Gynecology

## 2018-01-04 DIAGNOSIS — N939 Abnormal uterine and vaginal bleeding, unspecified: Secondary | ICD-10-CM | POA: Diagnosis not present

## 2018-01-04 DIAGNOSIS — G8918 Other acute postprocedural pain: Secondary | ICD-10-CM

## 2018-01-04 LAB — CBC
HCT: 36 % (ref 36.0–46.0)
HEMOGLOBIN: 12 g/dL (ref 12.0–15.0)
MCH: 31.3 pg (ref 26.0–34.0)
MCHC: 33.3 g/dL (ref 30.0–36.0)
MCV: 93.8 fL (ref 78.0–100.0)
PLATELETS: 285 10*3/uL (ref 150–400)
RBC: 3.84 MIL/uL — AB (ref 3.87–5.11)
RDW: 13.5 % (ref 11.5–15.5)
WBC: 18.3 10*3/uL — AB (ref 4.0–10.5)

## 2018-01-04 MED ORDER — OXYCODONE-ACETAMINOPHEN 5-325 MG PO TABS: 1.0000 | ORAL_TABLET | ORAL | 0 refills | Status: DC | PRN

## 2018-01-04 MED ORDER — IBUPROFEN 600 MG PO TABS: 600.0000 mg | ORAL_TABLET | Freq: Four times a day (QID) | ORAL | 0 refills | Status: DC | PRN

## 2018-01-04 NOTE — Discharge Instructions (Signed)
Vaginal Hysterectomy, Care After °Refer to this sheet in the next few weeks. These instructions provide you with information about caring for yourself after your procedure. Your health care provider may also give you more specific instructions. Your treatment has been planned according to current medical practices, but problems sometimes occur. Call your health care provider if you have any problems or questions after your procedure. °What can I expect after the procedure? °After the procedure, it is common to have: °· Pain. °· Soreness and numbness in your incision areas. °· Vaginal bleeding and discharge. °· Constipation. °· Temporary problems emptying the bladder. °· Feelings of sadness or other emotions. ° °Follow these instructions at home: °Medicines °· Take over-the-counter and prescription medicines only as told by your health care provider. °· If you were prescribed an antibiotic medicine, take it as told by your health care provider. Do not stop taking the antibiotic even if you start to feel better. °· Do not drive or operate heavy machinery while taking prescription pain medicine. °Activity °· Return to your normal activities as told by your health care provider. Ask your health care provider what activities are safe for you. °· Get regular exercise as told by your health care provider. You may be told to take short walks every day and go farther each time. °· Do not lift anything that is heavier than 10 lb (4.5 kg). °General instructions ° °· Do not put anything in your vagina for 6 weeks after your surgery or as told by your health care provider. This includes tampons and douches. °· Do not have sex until your health care provider says you can. °· Do not take baths, swim, or use a hot tub until your health care provider approves. °· Drink enough fluid to keep your urine clear or pale yellow. °· Do not drive for 24 hours if you were given a sedative. °· Keep all follow-up visits as told by your health  care provider. This is important. °Contact a health care provider if: °· Your pain medicine is not helping. °· You have a fever. °· You have redness, swelling, or pain at your incision site. °· You have blood, pus, or a bad-smelling discharge from your vagina. °· You continue to have difficulty urinating. °Get help right away if: °· You have severe abdominal or back pain. °· You have heavy bleeding from your vagina. °· You have chest pain or shortness of breath. °This information is not intended to replace advice given to you by your health care provider. Make sure you discuss any questions you have with your health care provider. °Document Released: 02/02/2016 Document Revised: 03/18/2016 Document Reviewed: 10/26/2015 °Elsevier Interactive Patient Education © 2018 Elsevier Inc. ° °

## 2018-01-04 NOTE — Progress Notes (Signed)
Discharge instructions reviewed with patient, recovering from surgery pamphlet given and pt verbalizes understanding of pain meds, follow-up appoint and home care.

## 2018-01-04 NOTE — Discharge Summary (Signed)
Physician Discharge Summary  Patient ID: Julie Fox MRN: [id number] DOB/AGE: June 24, 1976 42 y.o.  Admit date: 01/03/2018 Discharge date: 01/04/2018  Admission Diagnoses:  Discharge Diagnoses:  Principal Problem:   Abnormal uterine bleeding (AUB) Active Problems:   HIV disease ([place])   GAD (generalized anxiety disorder)   Post-operative state   Discharged Condition: good  Hospital Course: Patient had an uncomplicated surgery; for further details of this surgery, please refer to the operative note. Furthermore, the patient had an uncomplicated postoperative course.  By time of discharge, her pain was controlled on oral pain medications; she was ambulating, voiding without difficulty, tolerating regular diet and passing flatus.  She was deemed stable for discharge to home.    Consults: None  Significant Diagnostic Studies: labs: CBC  Treatments: surgery: total Vaginal hysterectomy  Discharge Exam: Blood pressure (!) 140/56, pulse 63, temperature 98.3 F (36.8 C), temperature source Oral, resp. rate 18, height 5\' 7"  (1.702 m), weight 261 lb (118.4 kg), last menstrual period 12/26/2017, SpO2 97 %. General appearance: alert and no distress Resp: clear to auscultation bilaterally Chest wall: no tenderness, n/a Cardio: regular rate and rhythm, S1, S2 normal, no murmur, click, rub or gallop Extremities: extremities normal, atraumatic, no cyanosis or edema Abd: soft, NT, ND GU: no bleeding    Disposition: 01-Home or Self Care  Discharge Instructions    Call MD for:  difficulty breathing, headache or visual disturbances   Complete by:  As directed    Call MD for:  extreme fatigue   Complete by:  As directed    Call MD for:  hives   Complete by:  As directed    Call MD for:  persistant dizziness or light-headedness   Complete by:  As directed    Call MD for:  persistant nausea and vomiting   Complete by:  As directed    Call MD for:  redness, tenderness, or signs of  infection (pain, swelling, redness, odor or green/yellow discharge around incision site)   Complete by:  As directed    Call MD for:  severe uncontrolled pain   Complete by:  As directed    Call MD for:  temperature >100.4   Complete by:  As directed    Diet - low sodium heart healthy   Complete by:  As directed    Discharge instructions   Complete by:  As directed    May return to work On January 23, 2018   Driving Restrictions   Complete by:  As directed    No driving for 2 weeks   Increase activity slowly   Complete by:  As directed    Lifting restrictions   Complete by:  As directed    No heavy lifting   Sexual Activity Restrictions   Complete by:  As directed    No sexual intercourse for 6 weeks     Allergies as of 01/04/2018      Reactions   Penicillins Anaphylaxis   Has patient had a PCN reaction causing immediate rash, facial/tongue/throat swelling, SOB or lightheadedness with hypotension: yes Has patient had a PCN reaction causing severe rash involving mucus membranes or skin necrosis: no Has patient had a PCN reaction that required hospitalization: no Has patient had a PCN reaction occurring within the last 10 years: yes If all of the above answers are "NO", then may proceed with Cephalosporin use.   Sulfa Antibiotics Rash      Medication List    TAKE these medications  atorvastatin 20 MG tablet Commonly known as:  LIPITOR TAKE 1 TABLET(20 MG) BY MOUTH DAILY   citalopram 40 MG tablet Commonly known as:  CELEXA Take 1 tablet (40 mg total) daily by mouth.   GENVOYA 150-150-200-10 MG Tabs tablet Generic drug:  elvitegravir-cobicistat-emtricitabine-tenofovir TAKE 1 TABLET BY MOUTH DAILY WITH BREAKFAST   ibuprofen 600 MG tablet Commonly known as:  ADVIL,MOTRIN Take 1 tablet (600 mg total) by mouth every 6 (six) hours as needed (mild pain).   oxyCODONE-acetaminophen 5-325 MG tablet Commonly known as:  PERCOCET/ROXICET Take 1 tablet by mouth every 4 (four)  hours as needed for moderate pain ((when tolerating fluids)).   traZODone 50 MG tablet Commonly known as:  DESYREL TAKE 1 TABLET(50 MG) BY MOUTH AT BEDTIME      Follow-up Information    Lavonia Drafts, MD Follow up in 2 week(s).   Specialty:  Obstetrics and Gynecology Contact information: [place] [address] [phone]           Signed: Lavonia Drafts 01/04/2018, 9:28 AM

## 2018-01-06 ENCOUNTER — Encounter: Payer: Self-pay | Admitting: Obstetrics & Gynecology

## 2018-01-10 ENCOUNTER — Telehealth: Payer: Self-pay

## 2018-01-10 NOTE — Telephone Encounter (Signed)
Pt called requesting a letter that states whether or not she can travel to [facility] s/p hysterectomy on Tuesday.  Pt has a court date on Thursday and needs to know asap.

## 2018-01-23 ENCOUNTER — Ambulatory Visit (INDEPENDENT_AMBULATORY_CARE_PROVIDER_SITE_OTHER): Payer: 59 | Admitting: Obstetrics & Gynecology

## 2018-01-23 ENCOUNTER — Encounter: Payer: Self-pay | Admitting: Obstetrics & Gynecology

## 2018-01-23 VITALS — BP 110/71 | HR 103 | Ht 68.0 in | Wt 262.8 lb

## 2018-01-23 DIAGNOSIS — Z9889 Other specified postprocedural states: Secondary | ICD-10-CM

## 2018-01-23 NOTE — Progress Notes (Signed)
History:  42 y.o. G0P0000 here today for her 2 week post op check. Pt is s/p [facility] 01/03/2018.  She reports increased pain post op that has improved. She reports that she is tolerating regular diet. She denies pain at present. She is passing urine and flatus.    The following portions of the patient's history were reviewed and updated as appropriate: allergies, current medications, past family history, past medical history, past social history, past surgical history and problem list.  Review of Systems:  Pertinent items are noted in HPI.    Objective:  Physical Exam Blood pressure 110/71, pulse (!) 103, height 5\' 8"  (1.727 m), weight 262 lb 12.8 oz (119.2 kg), last menstrual period 12/26/2017.  CONSTITUTIONAL: Well-developed, well-nourished female in no acute distress.  HENT:  Normocephalic, atraumatic EYES: Conjunctivae and EOM are normal. No scleral icterus.  NECK: Normal range of motion SKIN: Skin is warm and dry. No rash noted. Not diaphoretic.No pallor. [place]: Alert and oriented to person, place, and time. Normal coordination.  Abd: soft, ND. NT GU: deferred  Labs and Imaging Uterus and cervix CERVIX: - SQUAMOUS METAPLASIA. - NO DYSPLASIA IDENTIFIED. ENDOMETRIUM: - INACTIVE WITH PROGESTATIONAL CHANGES. - NO HYPERPLASIA OR MALIGNANCY. MYOMETRIUM: - LEIOMYOMATA. - NO EVIDENCE OF MALIGNANCY.  Assessment & Plan:  2 week post op check- pt is doing well.    Gradual return to full activities.  Nothing per vagina for 6 weeks Reviewed surg path  RTW in 1 week F/u in 3 weeks  Sivan Cuello L. Harraway-Smith, M.D., Cherlynn June

## 2018-01-23 NOTE — Patient Instructions (Signed)
Vaginal Hysterectomy, Care After °Refer to this sheet in the next few weeks. These instructions provide you with information about caring for yourself after your procedure. Your health care provider may also give you more specific instructions. Your treatment has been planned according to current medical practices, but problems sometimes occur. Call your health care provider if you have any problems or questions after your procedure. °What can I expect after the procedure? °After the procedure, it is common to have: °· Pain. °· Soreness and numbness in your incision areas. °· Vaginal bleeding and discharge. °· Constipation. °· Temporary problems emptying the bladder. °· Feelings of sadness or other emotions. ° °Follow these instructions at home: °Medicines °· Take over-the-counter and prescription medicines only as told by your health care provider. °· If you were prescribed an antibiotic medicine, take it as told by your health care provider. Do not stop taking the antibiotic even if you start to feel better. °· Do not drive or operate heavy machinery while taking prescription pain medicine. °Activity °· Return to your normal activities as told by your health care provider. Ask your health care provider what activities are safe for you. °· Get regular exercise as told by your health care provider. You may be told to take short walks every day and go farther each time. °· Do not lift anything that is heavier than 10 lb (4.5 kg). °General instructions ° °· Do not put anything in your vagina for 6 weeks after your surgery or as told by your health care provider. This includes tampons and douches. °· Do not have sex until your health care provider says you can. °· Do not take baths, swim, or use a hot tub until your health care provider approves. °· Drink enough fluid to keep your urine clear or pale yellow. °· Do not drive for 24 hours if you were given a sedative. °· Keep all follow-up visits as told by your health  care provider. This is important. °Contact a health care provider if: °· Your pain medicine is not helping. °· You have a fever. °· You have redness, swelling, or pain at your incision site. °· You have blood, pus, or a bad-smelling discharge from your vagina. °· You continue to have difficulty urinating. °Get help right away if: °· You have severe abdominal or back pain. °· You have heavy bleeding from your vagina. °· You have chest pain or shortness of breath. °This information is not intended to replace advice given to you by your health care provider. Make sure you discuss any questions you have with your health care provider. °Document Released: 02/02/2016 Document Revised: 03/18/2016 Document Reviewed: 10/26/2015 °Elsevier Interactive Patient Education © 2018 Elsevier Inc. ° °

## 2018-02-09 ENCOUNTER — Ambulatory Visit: Payer: 59 | Admitting: Obstetrics & Gynecology

## 2018-02-09 ENCOUNTER — Encounter: Payer: Self-pay | Admitting: Obstetrics & Gynecology

## 2018-02-09 VITALS — BP 119/65 | HR 91 | Ht 68.0 in | Wt 262.8 lb

## 2018-02-09 DIAGNOSIS — Z9889 Other specified postprocedural states: Secondary | ICD-10-CM

## 2018-02-09 NOTE — Progress Notes (Signed)
History:  41 y.o. G0P0000 here today for her 6 week post op check. She reports that she is eating and drinking and voiding and passing stools without difficulty. She is not sexually active. She denies bleeding and denies abnormal discharge. She reports that her pain is under control. She took very few of the pain meds post surgery.    The following portions of the patient's history were reviewed and updated as appropriate: allergies, current medications, past family history, past medical history, past social history, past surgical history and problem list.  Review of Systems:  Pertinent items are noted in HPI.    Objective:  Physical Exam Blood pressure 119/65, pulse 91, height 5\' 8"  (1.727 m), weight 262 lb 12.8 oz (119.2 kg), last menstrual period 12/26/2017.  CONSTITUTIONAL: Well-developed, well-nourished female in no acute distress.  HENT:  Normocephalic, atraumatic EYES: Conjunctivae and EOM are normal. No scleral icterus.  NECK: Normal range of motion SKIN: Skin is warm and dry. No rash noted. Not diaphoretic.No pallor. [place]: Alert and oriented to person, place, and time. Normal coordination.  Lungs CTA CV: RRR Abd: Soft, nontender and nondistended Pelvic: Normal appearing external genitalia; normal appearing vaginal mucosa vaginal cuff well healed.  Normal discharge. No masses or adnexal tenderness  Labs and Imaging 01/03/2018 Diagnosis Uterus and cervix CERVIX: - SQUAMOUS METAPLASIA. - NO DYSPLASIA IDENTIFIED. ENDOMETRIUM: - INACTIVE WITH PROGESTATIONAL CHANGES. - NO HYPERPLASIA OR MALIGNANCY. MYOMETRIUM: - LEIOMYOMATA. - NO EVIDENCE OF MALIGNANCY  Assessment & Plan:  6 week post op check- pt is doing well  Gradual return to full activity  Reviewed surg path  May RTW  No sexual activity for 2 more weeks   F/u in 3 months or sooner prn   Keerthana Vanrossum L. Harraway-Smith, M.D., Cherlynn June

## 2018-02-12 ENCOUNTER — Encounter: Payer: Self-pay | Admitting: Obstetrics & Gynecology

## 2018-02-27 ENCOUNTER — Ambulatory Visit: Payer: 59 | Admitting: Obstetrics & Gynecology

## 2018-02-28 ENCOUNTER — Other Ambulatory Visit: Payer: Self-pay | Admitting: Internal Medicine

## 2018-02-28 ENCOUNTER — Ambulatory Visit: Payer: 59 | Admitting: Internal Medicine

## 2018-02-28 ENCOUNTER — Encounter: Payer: Self-pay | Admitting: Internal Medicine

## 2018-02-28 ENCOUNTER — Ambulatory Visit (INDEPENDENT_AMBULATORY_CARE_PROVIDER_SITE_OTHER): Payer: 59 | Admitting: Licensed Clinical Social Worker

## 2018-02-28 VITALS — BP 127/62 | HR 90 | Temp 98.1°F | Ht 68.0 in | Wt 269.5 lb

## 2018-02-28 DIAGNOSIS — F419 Anxiety disorder, unspecified: Secondary | ICD-10-CM | POA: Diagnosis not present

## 2018-02-28 DIAGNOSIS — B20 Human immunodeficiency virus [HIV] disease: Secondary | ICD-10-CM

## 2018-02-28 DIAGNOSIS — F411 Generalized anxiety disorder: Secondary | ICD-10-CM

## 2018-02-28 DIAGNOSIS — R41840 Attention and concentration deficit: Secondary | ICD-10-CM

## 2018-02-28 DIAGNOSIS — Z862 Personal history of diseases of the blood and blood-forming organs and certain disorders involving the immune mechanism: Secondary | ICD-10-CM

## 2018-02-28 MED ORDER — FLUOXETINE HCL 20 MG PO CAPS: 20.0000 mg | ORAL_CAPSULE | Freq: Every day | ORAL | 3 refills | Status: DC

## 2018-02-28 NOTE — Patient Instructions (Addendum)
Start taking prozac 1 tab daily and in the meantime after 7 days, start taking 2 tabs daily.  With your celexa decrease to 1/2 tab after 7 days of taking prozac, then stop taking paxil after you are on 2 pills of prozac  [facility] -[facility] outpatient clinic - [phone]

## 2018-02-28 NOTE — Progress Notes (Signed)
Patient ID: Julie Fox, female   DOB: 07/16/1976, 42 y.o.   MRN: [id number]  HPI Allyson is a 42yo F with hx of HIV disease, anxiety. CD 4 count of 1210/VL<20 on genvoya. She recently had vaginal hysterectomy in March and already back to work in 44 weeks. She reports that she feels her anxiety is worsening, noticeable by co-workers. She states that she mostly has difficulty with focusing/concentration in addition to her anxiety symptoms. She questions whether citalopram is working since she has been on the medication for the past 5-6 yrs. Has not had recent counseling but had counseling in the past with some mild-to-modest improvement.  Outpatient Encounter Medications as of 02/28/2018  Medication Sig  . atorvastatin (LIPITOR) 20 MG tablet TAKE 1 TABLET(20 MG) BY MOUTH DAILY  . citalopram (CELEXA) 40 MG tablet Take 1 tablet (40 mg total) daily by mouth.  . fluticasone (FLONASE) 50 MCG/ACT nasal spray fluticasone propionate 50 mcg/actuation nasal spray,suspension  . GENVOYA 150-150-200-10 MG TABS tablet TAKE 1 TABLET BY MOUTH DAILY WITH BREAKFAST  . traZODone (DESYREL) 50 MG tablet TAKE 1 TABLET(50 MG) BY MOUTH AT BEDTIME  . ibuprofen (ADVIL,MOTRIN) 600 MG tablet Take 1 tablet (600 mg total) by mouth every 6 (six) hours as needed (mild pain). (Patient not taking: Reported on 02/28/2018)  . oxyCODONE-acetaminophen (PERCOCET/ROXICET) 5-325 MG tablet Take 1 tablet by mouth every 4 (four) hours as needed for moderate pain ((when tolerating fluids)). (Patient not taking: Reported on 02/28/2018)   No facility-administered encounter medications on file as of 02/28/2018.      Patient Active Problem List   Diagnosis Date Noted  . Post-operative state 01/03/2018  . Abnormal uterine bleeding (AUB) 12/12/2017  . GAD (generalized anxiety disorder) 06/13/2017  . Hyperlipidemia 06/13/2017  . HIV disease ([place]) 12/23/2016   Social History   Tobacco Use  . Smoking status: Current Some Day Smoker   Packs/day: 0.25    Years: 20.00    Pack years: 5.00    Types: Cigarettes    Start date: 12/24/1995  . Smokeless tobacco: Never Used  . Tobacco comment: cutting back. chantix did not work.  Substance Use Topics  . Alcohol use: No    Frequency: Never    Comment: once a month  . Drug use: No    There are no preventive care reminders to display for this patient.   Review of Systems  positive decreased concentration and agitation. Otherwise 12 point ros is negataive   Physical Exam   BP 127/62   Pulse 90   Temp 98.1 F (36.7 C) (Oral)   Ht '5\' 8"'$  (1.727 m)   Wt 269 lb 8 oz (122.2 kg)   LMP 12/26/2017   BMI 40.98 kg/m   Physical Exam  Constitutional:  oriented to person, place, and time. appears well-developed and well-nourished. No distress.  HENT: [place]/AT, PERRLA, no scleral icterus Mouth/Throat: Oropharynx is clear and moist. No oropharyngeal exudate.  Cardiovascular: Normal rate, regular rhythm and normal heart sounds. Exam reveals no g/m/r Pulmonary/Chest: Effort normal and breath sounds normal. No respiratory distress.  has no wheezes.  Neurological: alert and oriented to person, place, and time.  Skin: Skin is warm and dry. No rash noted. No erythema.  Psychiatric: a normal mood and affect.  behavior is normal. Stable from last appointment  Lab Results  Component Value Date   CD4TCELL 52 08/30/2017   Lab Results  Component Value Date   CD4TABS 1,210 08/30/2017   CD4TABS 1,730 02/23/2017  CD4TABS 1,270 09/27/2016   Lab Results  Component Value Date   HIV1RNAQUANT <20 NOT DETECTED 08/30/2017   Lab Results  Component Value Date   HEPBSAB POS (A) 09/27/2016   Lab Results  Component Value Date   LABRPR NON-REACTIVE 08/30/2017    CBC Lab Results  Component Value Date   WBC 18.3 (H) 01/04/2018   RBC 3.84 (L) 01/04/2018   HGB 12.0 01/04/2018   HCT 36.0 01/04/2018   PLT 285 01/04/2018   MCV 93.8 01/04/2018   MCH 31.3 01/04/2018   MCHC 33.3 01/04/2018    RDW 13.5 01/04/2018   LYMPHSABS 3.8 11/06/2017   MONOABS 0.7 11/06/2017   EOSABS 0.3 11/06/2017    BMET Lab Results  Component Value Date   NA 138 08/30/2017   K 4.0 08/30/2017   CL 103 08/30/2017   CO2 27 08/30/2017   GLUCOSE 146 (H) 08/30/2017   BUN 11 08/30/2017   CREATININE 1.11 (H) 01/03/2018   CALCIUM 9.4 08/30/2017   GFRNONAA >60 01/03/2018   GFRAA >60 01/03/2018      Assessment and Plan  HIV disease= well controlled. Continue on genvoya. Will check CD 4 count/VL  Hx of Leukocytosis = elevated WBC on labs in march, no hx of fevers to suggest infection. Will check cbc with diff to see if normalized  Anxiety = suggested to change to prozac and wean off of celexa. Also have introduced her to in clinic counseling to see if CBT will also be helpful.   Poor concentration =Her difficulty with concentration makes we also wonder if she has co-diagnosis of ADD. Will refer her to see outpatient psychiatry for evaluation and management  Health maintenance = she is concerned that she may not be immunized to measles. Unclear if she received all her childhood imms. Also works with many folks that are unimmunized which is worrisome to her . I suspect that she is immunized due to her DOB and introduction of live MMR. Will check measles Ig G to help with piece of mind.   Will need pneumococcal 23 at next visit

## 2018-02-28 NOTE — BH Specialist Note (Signed)
Integrated Behavioral Health Initial Visit  MRN: [id number] Name: Julie Fox  Number of [place] Clinician visits:: 1/6 Session Start time: 10:33am  Session End time: 10:57am Total time: 20 minutes  Type of Service: [place] Interpretor:No. Interpretor Name and Language: n/a   Warm Hand Off Completed.       SUBJECTIVE: Julie Fox is a 42 y.o. female accompanied by self Patient was referred by Dr Baxter Flattery for general anxiety. Patient reports the following symptoms/concerns: feeling fidgety and restless, "hamster wheel" thoughts, inability to concentrate or focus, tightness in neck/shoulders  Duration of problem: increasingly over 6 months; Severity of problem: moderate  OBJECTIVE: Mood: Anxious and Affect: Appropriate Risk of harm to self or others: No plan to harm self or others  LIFE CONTEXT: School/Work: Patient reports that coworkers have commented on her lack of concentration and that they tease her about her difficulty in focusing on one task or project at a time. 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  Self-Care: Besides sleeping, patient has not found any self-care practices that have been helpful.  Life Changes: Denies any stressors or life changes, though she did have a hysterectomy in March. Patient states that she has not noticed any changes in mood/anxiety since the hysterectomy that were not already there.   GOALS ADDRESSED: Patient will: 1. Reduce symptoms of: anxiety 2. Increase knowledge and/or ability of: self-management skills    INTERVENTIONS: Interventions utilized: Solution-Focused Strategies and Mindfulness or Relaxation Training   ASSESSMENT: Patient currently experiencing restlessness, difficulty in concentrating, racing and intrusive thoughts, and somatic response. She describes that this has been her symptomology for many years,  though it ebbs and flows. At this time there are no reported stressors triggering the anxiety. The description and symptomology (including fidgeting during session) rise to the level of diagnosis for Generalized Anxiety disorder. Counselor educated patient on progressive muscle relaxation and deep breathing. Counselor and patient explored and practiced these techniques for stress/anxiety management.    Patient may benefit from ongoing counseling for stress innoculation.  PLAN: 1. Contact office with schedule to set a follow up appointment with counselor.    Lillie Fragmin, LCSW

## 2018-03-01 LAB — T-HELPER CELL (CD4) - (RCID CLINIC ONLY)
CD4 T CELL ABS: 1910 /uL (ref 400–2700)
CD4 T CELL HELPER: 56 % — AB (ref 33–55)

## 2018-03-02 LAB — COMPLETE METABOLIC PANEL WITH GFR
AG RATIO: 1.4 (calc) (ref 1.0–2.5)
ALKALINE PHOSPHATASE (APISO): 94 U/L (ref 33–115)
ALT: 11 U/L (ref 6–29)
AST: 12 U/L (ref 10–30)
Albumin: 4.2 g/dL (ref 3.6–5.1)
BUN/Creatinine Ratio: 12 (calc) (ref 6–22)
BUN: 13 mg/dL (ref 7–25)
CHLORIDE: 105 mmol/L (ref 98–110)
CO2: 25 mmol/L (ref 20–32)
Calcium: 9.6 mg/dL (ref 8.6–10.2)
Creat: 1.11 mg/dL — ABNORMAL HIGH (ref 0.50–1.10)
GFR, EST NON AFRICAN AMERICAN: 62 mL/min/{1.73_m2} (ref >=60)
GFR, Est African American: 72 mL/min/{1.73_m2} (ref >=60)
GLOBULIN: 3 g/dL (ref 1.9–3.7)
Glucose, Bld: 87 mg/dL (ref 65–99)
POTASSIUM: 4.4 mmol/L (ref 3.5–5.3)
SODIUM: 138 mmol/L (ref 135–146)
Total Bilirubin: 0.5 mg/dL (ref 0.2–1.2)
Total Protein: 7.2 g/dL (ref 6.1–8.1)

## 2018-03-02 LAB — CBC WITH DIFFERENTIAL/PLATELET
BASOS ABS: 58 {cells}/uL (ref 0–200)
Basophils Relative: 0.8 %
EOS PCT: 2.5 %
Eosinophils Absolute: 180 cells/uL (ref 15–500)
HCT: 38.9 % (ref 35.0–45.0)
Hemoglobin: 13.2 g/dL (ref 11.7–15.5)
LYMPHS ABS: 3449 {cells}/uL (ref 850–3900)
MCH: 31.4 pg (ref 27.0–33.0)
MCHC: 33.9 g/dL (ref 32.0–36.0)
MCV: 92.6 fL (ref 80.0–100.0)
MPV: 10.7 fL (ref 7.5–12.5)
Monocytes Relative: 5.4 %
NEUTROS PCT: 43.4 %
Neutro Abs: 3125 cells/uL (ref 1500–7800)
PLATELETS: 314 10*3/uL (ref 140–400)
RBC: 4.2 10*6/uL (ref 3.80–5.10)
RDW: 12.6 % (ref 11.0–15.0)
TOTAL LYMPHOCYTE: 47.9 %
WBC mixed population: 389 cells/uL (ref 200–950)
WBC: 7.2 10*3/uL (ref 3.8–10.8)

## 2018-03-02 LAB — HIV-1 RNA QUANT-NO REFLEX-BLD
HIV 1 RNA QUANT: NOT DETECTED {copies}/mL
HIV-1 RNA QUANT, LOG: NOT DETECTED {Log_copies}/mL

## 2018-03-02 LAB — RUBEOLA ANTIBODY IGG: Rubeola IgG: 50.7 AU/mL

## 2018-03-20 ENCOUNTER — Encounter: Payer: Self-pay | Admitting: Internal Medicine

## 2018-03-21 ENCOUNTER — Other Ambulatory Visit: Payer: Self-pay | Admitting: *Deleted

## 2018-03-21 DIAGNOSIS — F411 Generalized anxiety disorder: Secondary | ICD-10-CM

## 2018-03-21 MED ORDER — FLUOXETINE HCL 20 MG PO CAPS: 40.0000 mg | ORAL_CAPSULE | Freq: Every day | ORAL | 3 refills | Status: DC

## 2018-03-21 NOTE — Telephone Encounter (Signed)
Received question regarding patient's prozac.  RN called patient, left message that I would call the pharmacy. Per [facility] help line, the prescription was written for 60, but only dispensed 30.  Pharmacy help line stated that there was a charge of $28 on file, she will be able to fill the first week of June.  RN requested to speak with local [facility].  Per [facility], they will dispense from new verbal prescription for Prozac 20 mg #60 refill 3, one tablet by mouth twice daily.  This will be filled today, pharmacy will call Julie Fox when it is ready. Landis Gandy, RN

## 2018-04-05 ENCOUNTER — Ambulatory Visit: Payer: 59 | Admitting: Internal Medicine

## 2018-04-05 ENCOUNTER — Encounter: Payer: Self-pay | Admitting: Internal Medicine

## 2018-04-05 VITALS — BP 139/85 | HR 92 | Temp 98.6°F | Wt 272.0 lb

## 2018-04-05 DIAGNOSIS — B20 Human immunodeficiency virus [HIV] disease: Secondary | ICD-10-CM

## 2018-04-05 DIAGNOSIS — R635 Abnormal weight gain: Secondary | ICD-10-CM

## 2018-04-05 DIAGNOSIS — F419 Anxiety disorder, unspecified: Secondary | ICD-10-CM | POA: Diagnosis not present

## 2018-04-05 DIAGNOSIS — Z23 Encounter for immunization: Secondary | ICD-10-CM | POA: Diagnosis not present

## 2018-04-05 MED ORDER — FLUOXETINE HCL 40 MG PO CAPS: 40.0000 mg | ORAL_CAPSULE | Freq: Every day | ORAL | 11 refills | Status: DC

## 2018-04-05 NOTE — Patient Instructions (Addendum)
[facility] - [phone]  I have sent in a new prescription for prozac at 40mg , to take 1 pill day. Start this once you finish your current bottle.

## 2018-04-05 NOTE — Progress Notes (Signed)
RFV: follow up for hiv disease  Patient ID: Julie Fox, female   DOB: 02-02-1976, 42 y.o.   MRN: [id number]  HPI 42yo F with hiv disease, anxiety, CD 4 count of 1910/VL<20 in May 2019, on genvoya. At last visit, we had changed her from celexa to prozac to see if she had any improvement. We also made referral for psychiatry but unclear if appt has been scheduled. She states that she has noticed a slight improvement but still jittery/feels the need to move constantly. + has ongoing weight gain.  Outpatient Encounter Medications as of 04/05/2018  Medication Sig  . atorvastatin (LIPITOR) 20 MG tablet TAKE 1 TABLET(20 MG) BY MOUTH DAILY  . FLUoxetine (PROZAC) 20 MG capsule Take 2 capsules (40 mg total) by mouth daily.  . fluticasone (FLONASE) 50 MCG/ACT nasal spray fluticasone propionate 50 mcg/actuation nasal spray,suspension  . GENVOYA 150-150-200-10 MG TABS tablet TAKE 1 TABLET BY MOUTH DAILY WITH BREAKFAST  . ibuprofen (ADVIL,MOTRIN) 600 MG tablet Take 1 tablet (600 mg total) by mouth every 6 (six) hours as needed (mild pain). (Patient not taking: Reported on 02/28/2018)  . oxyCODONE-acetaminophen (PERCOCET/ROXICET) 5-325 MG tablet Take 1 tablet by mouth every 4 (four) hours as needed for moderate pain ((when tolerating fluids)). (Patient not taking: Reported on 02/28/2018)  . traZODone (DESYREL) 50 MG tablet TAKE 1 TABLET(50 MG) BY MOUTH AT BEDTIME   No facility-administered encounter medications on file as of 04/05/2018.      Patient Active Problem List   Diagnosis Date Noted  . Post-operative state 01/03/2018  . Abnormal uterine bleeding (AUB) 12/12/2017  . GAD (generalized anxiety disorder) 06/13/2017  . Hyperlipidemia 06/13/2017  . HIV disease ([place]) 12/23/2016     There are no preventive care reminders to display for this patient.  Social History   Tobacco Use  . Smoking status: Current Some Day Smoker    Packs/day: 0.25    Years: 20.00    Pack years: 5.00    Types:  Cigarettes    Start date: 12/24/1995  . Smokeless tobacco: Never Used  . Tobacco comment: cutting back. chantix did not work.  Substance Use Topics  . Alcohol use: No    Frequency: Never    Comment: once a month  . Drug use: No   Review of Systems Per hpi, otherwise 12 point ros is negative Physical Exam  BP 139/85   Pulse 92   Temp 98.6 F (37 C) (Oral)   Wt 272 lb (123.4 kg)   LMP 12/26/2017   BMI 41.36 kg/m  Physical Exam  Constitutional:  oriented to person, place, and time. appears well-developed and well-nourished. No distress.  HENT: [place]/AT, PERRLA, no scleral icterus Mouth/Throat: Oropharynx is clear and moist. No oropharyngeal exudate.  Cardiovascular: Normal rate, regular rhythm and normal heart sounds. Exam reveals no gallop and no friction rub.  No murmur heard.  Pulmonary/Chest: Effort normal and breath sounds normal. No respiratory distress.  has no wheezes.  Neck = supple, no nuchal rigidity Abdominal: Soft. Bowel sounds are normal.  exhibits no distension. There is no tenderness.  Lymphadenopathy: no cervical adenopathy. No axillary adenopathy Neurological: alert and oriented to person, place, and time. BHALPFXTK Skin: Skin is warm and dry. No rash noted. No erythema.  Psychiatric: a normal mood and affect.  behavior is normal. Slightly pressured speech   Lab Results  Component Value Date   CD4TCELL 56 (H) 02/28/2018   Lab Results  Component Value Date   CD4TABS 1,910 02/28/2018  CD4TABS 1,210 08/30/2017   CD4TABS 1,730 02/23/2017   Lab Results  Component Value Date   HIV1RNAQUANT <20 NOT DETECTED 02/28/2018   Lab Results  Component Value Date   HEPBSAB POS (A) 09/27/2016   Lab Results  Component Value Date   LABRPR NON-REACTIVE 08/30/2017    CBC Lab Results  Component Value Date   WBC 7.2 02/28/2018   RBC 4.20 02/28/2018   HGB 13.2 02/28/2018   HCT 38.9 02/28/2018   PLT 314 02/28/2018   MCV 92.6 02/28/2018   MCH 31.4 02/28/2018    MCHC 33.9 02/28/2018   RDW 12.6 02/28/2018   LYMPHSABS 3,449 02/28/2018   MONOABS 0.7 11/06/2017   EOSABS 180 02/28/2018    BMET Lab Results  Component Value Date   NA 138 02/28/2018   K 4.4 02/28/2018   CL 105 02/28/2018   CO2 25 02/28/2018   GLUCOSE 87 02/28/2018   BUN 13 02/28/2018   CREATININE 1.11 (H) 02/28/2018   CALCIUM 9.6 02/28/2018   GFRNONAA 62 02/28/2018   GFRAA 72 02/28/2018      Assessment and Plan  Health promotion =Needs the pneumovax vaccine booster now  Anxiety = presumably, but will also  check thyroid function test, Will give psychiatry number for referral. Have her also meet with clinic counselor for CBT. I suspect that she needs dual therapy. Will refer to psychiatry for further management  hiv disease = well controlled continue on current agent  Weight gain = unclear if related to anxiety medications. Though some of her weight gain predates changing of medication. Recommend to reduce portion size, and limit late night eating

## 2018-04-06 LAB — THYROID PANEL WITH TSH
FREE THYROXINE INDEX: 3 (ref 1.4–3.8)
T3 Uptake: 28 % (ref 22–35)
T4 TOTAL: 10.6 ug/dL (ref 5.1–11.9)
TSH: 1.75 m[IU]/L

## 2018-05-11 ENCOUNTER — Encounter: Payer: Self-pay | Admitting: Family Medicine

## 2018-05-11 ENCOUNTER — Ambulatory Visit: Payer: 59 | Admitting: Family Medicine

## 2018-05-11 VITALS — BP 112/80 | HR 82 | Temp 98.4°F | Ht 68.0 in | Wt 275.3 lb

## 2018-05-11 DIAGNOSIS — K625 Hemorrhage of anus and rectum: Secondary | ICD-10-CM | POA: Diagnosis not present

## 2018-05-11 LAB — CBC
HCT: 38.5 % (ref 36.0–46.0)
Hemoglobin: 13.1 g/dL (ref 12.0–15.0)
MCHC: 34.1 g/dL (ref 30.0–36.0)
MCV: 94.2 fl (ref 78.0–100.0)
Platelets: 304 10*3/uL (ref 150.0–400.0)
RBC: 4.09 Mil/uL (ref 3.87–5.11)
RDW: 13.5 % (ref 11.5–15.5)
WBC: 7.9 10*3/uL (ref 4.0–10.5)

## 2018-05-11 MED ORDER — HYDROCORTISONE 2.5 % RE CREA: 1.0000 "application " | TOPICAL_CREAM | Freq: Two times a day (BID) | RECTAL | 0 refills | Status: DC

## 2018-05-11 NOTE — Progress Notes (Signed)
HPI:  Using dictation device. Unfortunately this device frequently misinterprets words/phrases.  Acute visit for BRBPR: -x 3 weeks -only with BM - BRB on stool and small amount on TP -stools are soft log to log with straining occ -denies rectal pain or itching -occ lower abd cramping, hx hysterectomy -no fevers, diarrhea, vomiting, nausea, wt loss, anorexia -hx hemorrhoids with banding and prolapsed colon - sees Dr. Earlean Shawl   ROS: See pertinent positives and negatives per HPI.  Past Medical History:  Diagnosis Date  . GAD (generalized anxiety disorder)   . GERD (gastroesophageal reflux disease)    no meds, diet controlled  . History of epilepsy    childhood seizures - last one age 10 yr, non since  . HIV (human immunodeficiency virus infection) ([place])   . Hx of abnormal cervical Pap smear    2012 per pt, then normal since   . Migraine    otc med prn  . Obesity   . Pure hypercholesterolemia   . Seasonal allergies     Past Surgical History:  Procedure Laterality Date  . CHOLECYSTECTOMY    . COLONOSCOPY    . TOE SURGERY     age 53-cyst excision per patient  . TONSILLECTOMY  2016  . UPPER GI ENDOSCOPY    . VAGINAL HYSTERECTOMY Bilateral 01/03/2018   Procedure: HYSTERECTOMY VAGINAL WITH SALPINGECTOMY;  Surgeon: Lavonia Drafts, MD;  Location: [facility];  Service: Gynecology;  Laterality: Bilateral;  . WISDOM TOOTH EXTRACTION     x 2 lower    Family History  Problem Relation Age of Onset  . Hypertension Mother   . Hyperlipidemia Mother   . Diabetes Mother   . Cancer Father   . Non-Hodgkin's lymphoma Maternal Grandmother   . Cancer Maternal Grandfather     SOCIAL HX: see hpi   Current Outpatient Medications:  .  atorvastatin (LIPITOR) 20 MG tablet, TAKE 1 TABLET(20 MG) BY MOUTH DAILY, Disp: 30 tablet, Rfl: 5 .  FLUoxetine (PROZAC) 40 MG capsule, Take 1 capsule (40 mg total) by mouth daily., Disp: 30 capsule, Rfl: 11 .  GENVOYA 150-150-200-10 MG TABS tablet,  TAKE 1 TABLET BY MOUTH DAILY WITH BREAKFAST, Disp: 30 tablet, Rfl: 11 .  traZODone (DESYREL) 50 MG tablet, TAKE 1 TABLET(50 MG) BY MOUTH AT BEDTIME, Disp: 30 tablet, Rfl: 5 .  hydrocortisone (ANUSOL-HC) 2.5 % rectal cream, Place 1 application rectally 2 (two) times daily., Disp: 30 g, Rfl: 0  EXAM:  Vitals:   05/11/18 1339  BP: 112/80  Pulse: 82  Temp: 98.4 F (36.9 C)  SpO2: 98%    Body mass index is 41.86 kg/m.  GENERAL: vitals reviewed and listed above, alert, oriented, appears well hydrated and in no acute distress  HEENT: atraumatic, conjunttiva clear, no obvious abnormalities on inspection of external nose and ears  NECK: no obvious masses on inspection  LUNGS: clear to auscultation bilaterally, no wheezes, rales or rhonchi, good air movement  CV: HRRR, no peripheral edema  DRE: flesh colored to pinkish polypoid lesion x2 perirectal region, no blood on exam glove but hemocult +, no firm lesions or tears appreciated this exam  MS: moves all extremities without noticeable abnormality  PSYCH: pleasant and cooperative, no obvious depression or anxiety  ASSESSMENT AND PLAN:  Discussed the following assessment and plan:  BRBPR (bright red blood per rectum) - Plan: CBC  -check CBC -advised GI evaluation given hx/exam - she agrees to call for appt - will sent referral in case needed -sitz baths,  fiber, Anusol in interim -return and ER precautions discussed in case worsening or other symptoms -Patient advised to return or notify a doctor immediately if symptoms worsen or persist or new concerns arise.  Patient Instructions  BEFORE YOU LEAVE: -labs -follow up: as needed if not seeing GI or in interim and for annual exam in august or september  Fiber supplement each morning to keep stools soft (metameucil or benafiber)  Call your gastroenterologist for evaluation - we also sent referral  Sitz baths  I hope you are feeling better soon! Seek care promptly if your  symptoms worsen or new concerns arise     Lucretia Kern, DO

## 2018-05-11 NOTE — Patient Instructions (Addendum)
BEFORE YOU LEAVE: -labs -follow up: as needed if not seeing GI or in interim and for annual exam in august or september  Fiber supplement each morning to keep stools soft (metameucil or benafiber)  Call your gastroenterologist for evaluation - we also sent referral  Sitz baths  I hope you are feeling better soon! Seek care promptly if your symptoms worsen or new concerns arise

## 2018-05-16 ENCOUNTER — Ambulatory Visit: Payer: 59 | Admitting: Family Medicine

## 2018-05-27 ENCOUNTER — Other Ambulatory Visit: Payer: Self-pay | Admitting: Internal Medicine

## 2018-05-30 ENCOUNTER — Encounter: Payer: Self-pay | Admitting: Obstetrics & Gynecology

## 2018-06-12 ENCOUNTER — Encounter: Payer: 59 | Admitting: Family Medicine

## 2018-06-15 LAB — HM COLONOSCOPY

## 2018-06-19 ENCOUNTER — Encounter: Payer: Self-pay | Admitting: Family Medicine

## 2018-06-19 NOTE — Progress Notes (Signed)
HPI:  Using dictation device. Unfortunately this device frequently misinterprets words/phrases.  Here for CPE:  -Concerns and/or follow up today:   PMH significant for HIV (sees ID for management), hyperlipidemia on statin, hx of abnormal uterine bleeding s/p hysterectomy, GAD on prozac, hx BRBPR, obseity. She reports overall is doing well. Sees gyn for annual exams, pelvic and breast exam. Saw GI and had colonosocpy. Reports is set up for hemorrhoid banding. Is trying o eat health and is walking on a regular basis.   -Taking folic acid, vitamin D or calcium: no -Diabetes and Dyslipidemia Screening: fasting -Vaccines: see vaccine section EPIC -pap history: s/p hysterectomy, sees gyn -FDLMP: see nursing notes -sexual activity: yes, female partner, no new partners -wants STI testing (Hep C if born [birth year]): no -FH breast, colon or ovarian ca: see FH Last mammogram: wants to schedule Last colon cancer screening: had colonoscopy at age 28 for bleeding, reports had polyp and was told to repeat in 5 years Breast Ca Risk Assessment: see family history and pt history DEXA (>/= 23): n/a  -Alcohol, Tobacco, drug use: see social history  Review of Systems - no fevers, unintentional weight loss, vision loss, hearing loss, chest pain, sob, hemoptysis, melena, hematochezia, hematuria, genital discharge, changing or concerning skin lesions, bleeding, bruising, loc, thoughts of self harm or SI  Past Medical History:  Diagnosis Date  . GAD (generalized anxiety disorder)   . GERD (gastroesophageal reflux disease)    no meds, diet controlled  . History of epilepsy    childhood seizures - last one age 28 yr, non since  . HIV (human immunodeficiency virus infection) ([place])   . Hx of abnormal cervical Pap smear    2012 per pt, then normal since   . Migraine    otc med prn  . Obesity   . Pure hypercholesterolemia   . Seasonal allergies     Past Surgical History:  Procedure Laterality Date  .  CHOLECYSTECTOMY    . COLONOSCOPY    . TOE SURGERY     age 34-cyst excision per patient  . TONSILLECTOMY  2016  . UPPER GI ENDOSCOPY    . VAGINAL HYSTERECTOMY Bilateral 01/03/2018   Procedure: HYSTERECTOMY VAGINAL WITH SALPINGECTOMY;  Surgeon: Lavonia Drafts, MD;  Location: [facility];  Service: Gynecology;  Laterality: Bilateral;  . WISDOM TOOTH EXTRACTION     x 2 lower    Family History  Problem Relation Age of Onset  . Hypertension Mother   . Hyperlipidemia Mother   . Diabetes Mother   . Cancer Father   . Non-Hodgkin's lymphoma Maternal Grandmother   . Cancer Maternal Grandfather     Social History   Socioeconomic History  . Marital status: Single    Spouse name: Not on file  . Number of children: Not on file  . Years of education: Not on file  . Highest education level: Not on file  Occupational History  . Not on file  Social Needs  . Financial resource strain: Not on file  . Food insecurity:    Worry: Not on file    Inability: Not on file  . Transportation needs:    Medical: Not on file    Non-medical: Not on file  Tobacco Use  . Smoking status: Current Some Day Smoker    Packs/day: 0.25    Years: 20.00    Pack years: 5.00    Types: Cigarettes    Start date: 12/24/1995  . Smokeless tobacco: Never Used  . Tobacco  comment: cutting back. chantix did not work.  Substance and Sexual Activity  . Alcohol use: No    Frequency: Never    Comment: once a month  . Drug use: No  . Sexual activity: Not Currently    Partners: Male    Birth control/protection: None  Lifestyle  . Physical activity:    Days per week: Not on file    Minutes per session: Not on file  . Stress: Not on file  Relationships  . Social connections:    Talks on phone: Not on file    Gets together: Not on file    Attends religious service: Not on file    Active member of club or organization: Not on file    Attends meetings of clubs or organizations: Not on file    Relationship status:  Not on file  Other Topics Concern  . Not on file  Social History Narrative   Work or School: Engineer, structural, 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: lives with mother      Spiritual Beliefs:      Lifestyle: no regular exercise, diet not great     Current Outpatient Medications:  .  atorvastatin (LIPITOR) 20 MG tablet, TAKE 1 TABLET(20 MG) BY MOUTH DAILY, Disp: 30 tablet, Rfl: 5 .  FLUoxetine (PROZAC) 40 MG capsule, Take 1 capsule (40 mg total) by mouth daily., Disp: 30 capsule, Rfl: 11 .  GENVOYA 150-150-200-10 MG TABS tablet, TAKE 1 TABLET BY MOUTH DAILY WITH BREAKFAST, Disp: 30 tablet, Rfl: 11 .  hydrocortisone (ANUSOL-HC) 2.5 % rectal cream, Place 1 application rectally 2 (two) times daily., Disp: 30 g, Rfl: 0 .  traZODone (DESYREL) 50 MG tablet, TAKE 1 TABLET(50 MG) BY MOUTH AT BEDTIME, Disp: 30 tablet, Rfl: 5  EXAM:  There were no vitals filed for this visit.  GENERAL: vitals reviewed and listed below, alert, oriented, appears well hydrated and in no acute distress  HEENT: head atraumatic, PERRLA, normal appearance of eyes, ears, nose and mouth. moist mucus membranes.  NECK: supple, no masses or lymphadenopathy  LUNGS: clear to auscultation bilaterally, no rales, rhonchi or wheeze  CV: HRRR, no peripheral edema or cyanosis, normal pedal pulses  ABDOMEN: bowel sounds normal, soft, non tender to palpation, no masses, no rebound or guarding  GU/BREAST: declined, sees gyn  SKIN: no rash or abnormal lesions, declined full skin exam  MS: normal gait, moves all extremities normally  NEURO: normal gait, speech and thought processing grossly intact, muscle tone grossly intact throughout  PSYCH: normal affect, pleasant and cooperative  ASSESSMENT AND PLAN:  Discussed the following assessment and plan:  PREVENTIVE EXAM: -Discussed and advised all [place] preventive services health task force level A and B recommendations for age, sex and risks. -Advised at least 150 minutes of  exercise per week and a healthy diet with avoidance of (less then 1 serving per week) processed foods, white starches, red meat, fast foods and sweets and consisting of: * 5-9 servings of fresh fruits and vegetables (not corn or potatoes) *nuts and seeds, beans *olives and olive oil *lean meats such as fish and white chicken  *whole grains -labs, studies and vaccines per orders this encounter   2. GAD (generalized anxiety disorder) -continue prozac. Reports doing well.  3. Hyperlipidemia, unspecified hyperlipidemia type -lifestyle recs - Hemoglobin A1c - Lipid panel -cont current tx pending labs  4. HIV disease ([place]) -sees hiv for managment  5. Breast cancer screening - MM Digital Screening; Future  Patient advised to return to clinic immediately if symptoms worsen or persist or new concerns.  There are no Patient Instructions on file for this visit.  No follow-ups on file.  Julie Kern, DO

## 2018-06-20 ENCOUNTER — Ambulatory Visit (INDEPENDENT_AMBULATORY_CARE_PROVIDER_SITE_OTHER): Payer: 59 | Admitting: Family Medicine

## 2018-06-20 ENCOUNTER — Encounter: Payer: Self-pay | Admitting: Family Medicine

## 2018-06-20 VITALS — BP 120/84 | HR 84 | Temp 98.1°F | Ht 68.0 in | Wt 279.3 lb

## 2018-06-20 DIAGNOSIS — Z0000 Encounter for general adult medical examination without abnormal findings: Secondary | ICD-10-CM | POA: Diagnosis not present

## 2018-06-20 DIAGNOSIS — Z1239 Encounter for other screening for malignant neoplasm of breast: Secondary | ICD-10-CM

## 2018-06-20 DIAGNOSIS — E785 Hyperlipidemia, unspecified: Secondary | ICD-10-CM | POA: Diagnosis not present

## 2018-06-20 DIAGNOSIS — B20 Human immunodeficiency virus [HIV] disease: Secondary | ICD-10-CM | POA: Diagnosis not present

## 2018-06-20 DIAGNOSIS — Z1231 Encounter for screening mammogram for malignant neoplasm of breast: Secondary | ICD-10-CM

## 2018-06-20 DIAGNOSIS — F320 Major depressive disorder, single episode, mild: Secondary | ICD-10-CM

## 2018-06-20 DIAGNOSIS — F411 Generalized anxiety disorder: Secondary | ICD-10-CM | POA: Diagnosis not present

## 2018-06-20 LAB — LIPID PANEL
CHOL/HDL RATIO: 3
Cholesterol: 152 mg/dL (ref 0–200)
HDL: 45.6 mg/dL (ref >39.00)
LDL CALC: 80 mg/dL (ref 0–99)
NonHDL: 106.23
Triglycerides: 131 mg/dL (ref 0.0–149.0)
VLDL: 26.2 mg/dL (ref 0.0–40.0)

## 2018-06-20 LAB — HEMOGLOBIN A1C: Hgb A1c MFr Bld: 5.8 % (ref 4.6–6.5)

## 2018-06-20 NOTE — Patient Instructions (Signed)
BEFORE YOU LEAVE: -order mammogram -labs -follow up: 4-6 months  Get your flu shot in October.  We have ordered labs or studies at this visit. It can take up to 1-2 weeks for results and processing. IF results require follow up or explanation, we will call you with instructions. Clinically stable results will be released to your [facility] If you have not heard from [place] or cannot find your results in [facility] in 2 weeks please contact our office at [phone].  If you are not yet signed up for [facility], please consider signing up.   Preventive Care 40-64 Years, Female Preventive care refers to lifestyle choices and visits with your health care provider that can promote health and wellness. What does preventive care include?  A yearly physical exam. This is also called an annual well check.  Dental exams once or twice a year.  Routine eye exams. Ask your health care provider how often you should have your eyes checked.  Personal lifestyle choices, including: ? Daily care of your teeth and gums. ? Regular physical activity. ? Eating a healthy diet. ? Avoiding tobacco and drug use. ? Limiting alcohol use. ? Practicing safe sex. ? Taking vitamin and mineral supplements as recommended by your health care provider. What happens during an annual well check? The services and screenings done by your health care provider during your annual well check will depend on your age, overall health, lifestyle risk factors, and family history of disease. Counseling Your health care provider may ask you questions about your:  Alcohol use.  Tobacco use.  Drug use.  Emotional well-being.  Home and relationship well-being.  Sexual activity.  Eating habits.  Work and work Statistician.  Method of birth control.  Menstrual cycle.  Pregnancy history.  Screening You may have the following tests or measurements:  Height, weight, and BMI.  Blood pressure.  Lipid and cholesterol levels.  These may be checked every 5 years, or more frequently if you are over 94 years old.  Skin check.  Lung cancer screening. You may have this screening every year starting at age 17 if you have a 30-pack-year history of smoking and currently smoke or have quit within the past 15 years.  Fecal occult blood test (FOBT) of the stool. You may have this test every year starting at age 33.  Flexible sigmoidoscopy or colonoscopy. You may have a sigmoidoscopy every 5 years or a colonoscopy every 10 years starting at age 96.  Hepatitis C blood test.  Hepatitis B blood test.  Sexually transmitted disease (STD) testing.  Diabetes screening. This is done by checking your blood sugar (glucose) after you have not eaten for a while (fasting). You may have this done every 1-3 years.  Mammogram. This may be done every 1-2 years. Talk to your health care provider about when you should start having regular mammograms. This may depend on whether you have a family history of breast cancer.  BRCA-related cancer screening. This may be done if you have a family history of breast, ovarian, tubal, or peritoneal cancers.  Pelvic exam and Pap test. This may be done every 3 years starting at age 36. Starting at age 54, this may be done every 5 years if you have a Pap test in combination with an HPV test.  Bone density scan. This is done to screen for osteoporosis. You may have this scan if you are at high risk for osteoporosis.  Discuss your test results, treatment options, and if necessary, the  need for more tests with your health care provider. Vaccines Your health care provider may recommend certain vaccines, such as:  Influenza vaccine. This is recommended every year.  Tetanus, diphtheria, and acellular pertussis (Tdap, Td) vaccine. You may need a Td booster every 10 years.  Varicella vaccine. You may need this if you have not been vaccinated.  Zoster vaccine. You may need this after age 53.  Measles,  mumps, and rubella (MMR) vaccine. You may need at least one dose of MMR if you were born in 1957 or later. You may also need a second dose.  Pneumococcal 13-valent conjugate (PCV13) vaccine. You may need this if you have certain conditions and were not previously vaccinated.  Pneumococcal polysaccharide (PPSV23) vaccine. You may need one or two doses if you smoke cigarettes or if you have certain conditions.  Meningococcal vaccine. You may need this if you have certain conditions.  Hepatitis A vaccine. You may need this if you have certain conditions or if you travel or work in places where you may be exposed to hepatitis A.  Hepatitis B vaccine. You may need this if you have certain conditions or if you travel or work in places where you may be exposed to hepatitis B.  Haemophilus influenzae type b (Hib) vaccine. You may need this if you have certain conditions.  Talk to your health care provider about which screenings and vaccines you need and how often you need them. This information is not intended to replace advice given to you by your health care provider. Make sure you discuss any questions you have with your health care provider. Document Released: 11/07/2015 Document Revised: 06/30/2016 Document Reviewed: 08/12/2015 Elsevier Interactive Patient Education  [employer].

## 2018-07-10 ENCOUNTER — Ambulatory Visit: Payer: 59 | Admitting: Obstetrics & Gynecology

## 2018-07-10 ENCOUNTER — Encounter: Payer: Self-pay | Admitting: Obstetrics & Gynecology

## 2018-07-10 VITALS — BP 123/65 | HR 85

## 2018-07-10 DIAGNOSIS — Z0000 Encounter for general adult medical examination without abnormal findings: Secondary | ICD-10-CM

## 2018-07-10 DIAGNOSIS — Z6841 Body Mass Index (BMI) 40.0 and over, adult: Secondary | ICD-10-CM

## 2018-07-10 NOTE — Progress Notes (Signed)
History:  42 y.o. G0P0000 here today for 3 month post op check.  Pt si s/p TVH on 01/10/2018.  Pt denies problems. She reports more energy and is more active since her hyst. She does report increased weight gain and reports that she was told by her primary doc to ask if it could be due to 'hormonal causes.'  Pt denies vasomotor sx.    Her TSH was checked and it was WNL  The following portions of the patient's history were reviewed and updated as appropriate: allergies, current medications, past family history, past medical history, past social history, past surgical history and problem list.  Review of Systems:  Pertinent items are noted in HPI.    Objective:  Physical Exam BP 123/65   Pulse 85   LMP 12/26/2017   Last menstrual period 12/26/2017.  CONSTITUTIONAL: Well-developed, well-nourished female in no acute distress.  HENT:  Normocephalic, atraumatic EYES: Conjunctivae and EOM are normal. No scleral icterus.  NECK: Normal range of motion SKIN: Skin is warm and dry. No rash noted. Not diaphoretic.No pallor. [place]: Alert and oriented to person, place, and time. Normal coordination.  Pelvic: not indicated   Labs and Imaging No results found.  Assessment & Plan:  Obesity management  Discussed resistance training. Reviewed online plans with pt  Reviewed diet and cardio  Health maintenance  Screening mammogram  Follow up in 1 year or sooner prn  Total face-to-face time with patient was 20 min.  Greater than 50% was spent in counseling and coordination of care with the patient.   Francoise Chojnowski L. Harraway-Smith, M.D., Cherlynn June o

## 2018-07-12 ENCOUNTER — Ambulatory Visit
Admission: RE | Admit: 2018-07-12 | Discharge: 2018-07-12 | Disposition: A | Discharge status: Home / Self Care | Payer: 59 | Source: Ambulatory Visit | Attending: Family Medicine | Admitting: Family Medicine

## 2018-07-12 ENCOUNTER — Ambulatory Visit: Payer: 59

## 2018-07-12 DIAGNOSIS — K648 Other hemorrhoids: Secondary | ICD-10-CM | POA: Insufficient documentation

## 2018-07-12 DIAGNOSIS — Z86010 Personal history of colonic polyps: Secondary | ICD-10-CM | POA: Insufficient documentation

## 2018-07-12 DIAGNOSIS — Z1239 Encounter for other screening for malignant neoplasm of breast: Secondary | ICD-10-CM

## 2018-08-09 DIAGNOSIS — K219 Gastro-esophageal reflux disease without esophagitis: Secondary | ICD-10-CM | POA: Insufficient documentation

## 2018-10-23 ENCOUNTER — Ambulatory Visit: Payer: 59 | Admitting: Family Medicine

## 2018-12-11 ENCOUNTER — Other Ambulatory Visit: Payer: Self-pay

## 2018-12-11 DIAGNOSIS — B20 Human immunodeficiency virus [HIV] disease: Secondary | ICD-10-CM

## 2018-12-11 DIAGNOSIS — Z113 Encounter for screening for infections with a predominantly sexual mode of transmission: Secondary | ICD-10-CM

## 2018-12-12 ENCOUNTER — Other Ambulatory Visit: Payer: 59

## 2018-12-20 ENCOUNTER — Other Ambulatory Visit: Payer: Self-pay | Admitting: Internal Medicine

## 2018-12-21 ENCOUNTER — Other Ambulatory Visit: Payer: Self-pay | Admitting: Internal Medicine

## 2018-12-27 ENCOUNTER — Encounter: Payer: Self-pay | Admitting: Internal Medicine

## 2018-12-27 ENCOUNTER — Ambulatory Visit (INDEPENDENT_AMBULATORY_CARE_PROVIDER_SITE_OTHER): Payer: 59 | Admitting: Internal Medicine

## 2018-12-27 VITALS — BP 130/80 | HR 77 | Temp 98.5°F | Wt 280.0 lb

## 2018-12-27 DIAGNOSIS — B20 Human immunodeficiency virus [HIV] disease: Secondary | ICD-10-CM

## 2018-12-27 DIAGNOSIS — Z113 Encounter for screening for infections with a predominantly sexual mode of transmission: Secondary | ICD-10-CM | POA: Diagnosis not present

## 2018-12-27 MED ORDER — PAROXETINE HCL 20 MG PO TABS: 20.0000 mg | ORAL_TABLET | Freq: Every day | ORAL | 11 refills | Status: DC

## 2018-12-27 NOTE — Patient Instructions (Signed)
Take prozac 1/2 dose x 7 days then can switch to paxil to take daily

## 2018-12-27 NOTE — Progress Notes (Signed)
RFV: follow up with hiv disease  Patient ID: Julie Fox, female   DOB: [birth year], 43 y.o.   MRN: [id number]  HPI 43yo F with hiv disease, cd 4 count of 1910/VL<20 but has missed roughly 4 doses since we last saw her. Once a week, long work hours .has not had a day off of work since April Disturbing dreams while on prozac  Outpatient Encounter Medications as of 12/27/2018  Medication Sig  . atorvastatin (LIPITOR) 20 MG tablet TAKE 1 TABLET(20 MG) BY MOUTH DAILY  . FLUoxetine (PROZAC) 40 MG capsule Take 1 capsule (40 mg total) by mouth daily.  . GENVOYA 150-150-200-10 MG TABS tablet TAKE 1 TABLET BY MOUTH DAILY WITH BREAKFAST   No facility-administered encounter medications on file as of 12/27/2018.      Patient Active Problem List   Diagnosis Date Noted  . Mild single current episode of major depressive disorder ([place]) 06/20/2018  . Post-operative state 01/03/2018  . Abnormal uterine bleeding (AUB) 12/12/2017  . GAD (generalized anxiety disorder) 06/13/2017  . Hyperlipidemia 06/13/2017  . HIV disease ([place]) 12/23/2016     There are no preventive care reminders to display for this patient.   Review of Systems Review of Systems  Constitutional: Negative for fever, chills, diaphoresis, activity change, appetite change, fatigue and unexpected weight change.  HENT: Negative for congestion, sore throat, rhinorrhea, sneezing, trouble swallowing and sinus pressure.  Eyes: Negative for photophobia and visual disturbance.  Respiratory: Negative for cough, chest tightness, shortness of breath, wheezing and stridor.  Cardiovascular: Negative for chest pain, palpitations and leg swelling.  Gastrointestinal: Negative for nausea, vomiting, abdominal pain, diarrhea, constipation, blood in stool, abdominal distention and anal bleeding.  Genitourinary: Negative for dysuria, hematuria, flank pain and difficulty urinating.  Musculoskeletal: Negative for myalgias, back pain, joint swelling,  arthralgias and gait problem.  Skin: Negative for color change, pallor, rash and wound.  Neurological: Negative for dizziness, tremors, weakness and light-headedness.  Hematological: Negative for adenopathy. Does not bruise/bleed easily.  Psychiatric/Behavioral: Negative for behavioral problems, confusion, sleep disturbance, dysphoric mood, decreased concentration and agitation.    Physical Exam   BP 130/80   Pulse 77   Temp 98.5 F (36.9 C) (Oral)   Wt 280 lb (127 kg)   LMP 12/26/2017   BMI 42.57 kg/m   Physical Exam  Constitutional:  oriented to person, place, and time. appears well-developed and well-nourished. No distress.  HENT: [place]/AT, PERRLA, no scleral icterus Mouth/Throat: Oropharynx is clear and moist. No oropharyngeal exudate.  Cardiovascular: Normal rate, regular rhythm and normal heart sounds. Exam reveals no gallop and no friction rub.  No murmur heard.  Pulmonary/Chest: Effort normal and breath sounds normal. No respiratory distress.  has no wheezes.  Neck = supple, no nuchal rigidity Lymphadenopathy: no cervical adenopathy. No axillary adenopathy Neurological: alert and oriented to person, place, and time.  Skin: Skin is warm and dry. No rash noted. No erythema.  Psychiatric: a normal mood and affect.  behavior is normal.   Lab Results  Component Value Date   CD4TCELL 56 (H) 02/28/2018   Lab Results  Component Value Date   CD4TABS 1,910 02/28/2018   CD4TABS 1,210 08/30/2017   CD4TABS 1,730 02/23/2017   Lab Results  Component Value Date   HIV1RNAQUANT <20 NOT DETECTED 02/28/2018   Lab Results  Component Value Date   HEPBSAB POS (A) 09/27/2016   Lab Results  Component Value Date   LABRPR NON-REACTIVE 08/30/2017    CBC Lab Results  Component  Value Date   WBC 7.9 05/11/2018   RBC 4.09 05/11/2018   HGB 13.1 05/11/2018   HCT 38.5 05/11/2018   PLT 304.0 05/11/2018   MCV 94.2 05/11/2018   MCH 31.4 02/28/2018   MCHC 34.1 05/11/2018   RDW 13.5  05/11/2018   LYMPHSABS 3,449 02/28/2018   MONOABS 0.7 11/06/2017   EOSABS 180 02/28/2018    BMET Lab Results  Component Value Date   NA 138 02/28/2018   K 4.4 02/28/2018   CL 105 02/28/2018   CO2 25 02/28/2018   GLUCOSE 87 02/28/2018   BUN 13 02/28/2018   CREATININE 1.11 (H) 02/28/2018   CALCIUM 9.6 02/28/2018   GFRNONAA 62 02/28/2018   GFRAA 72 02/28/2018      Assessment and Plan   hiv disease = will check labs  Anxiety = will change to different medication for less side effect profile  rtc in 6 wk

## 2018-12-28 LAB — URINE CYTOLOGY ANCILLARY ONLY
CHLAMYDIA, DNA PROBE: NEGATIVE
NEISSERIA GONORRHEA: NEGATIVE

## 2018-12-29 LAB — CBC WITH DIFFERENTIAL/PLATELET
Absolute Monocytes: 485 cells/uL (ref 200–950)
BASOS PCT: 0.6 %
Basophils Absolute: 46 cells/uL (ref 0–200)
EOS PCT: 1.3 %
Eosinophils Absolute: 100 cells/uL (ref 15–500)
HCT: 39.2 % (ref 35.0–45.0)
HEMOGLOBIN: 13.5 g/dL (ref 11.7–15.5)
Lymphs Abs: 2972 cells/uL (ref 850–3900)
MCH: 32.3 pg (ref 27.0–33.0)
MCHC: 34.4 g/dL (ref 32.0–36.0)
MCV: 93.8 fL (ref 80.0–100.0)
MONOS PCT: 6.3 %
MPV: 10.8 fL (ref 7.5–12.5)
NEUTROS ABS: 4096 {cells}/uL (ref 1500–7800)
Neutrophils Relative %: 53.2 %
PLATELETS: 333 10*3/uL (ref 140–400)
RBC: 4.18 10*6/uL (ref 3.80–5.10)
RDW: 12.6 % (ref 11.0–15.0)
TOTAL LYMPHOCYTE: 38.6 %
WBC: 7.7 10*3/uL (ref 3.8–10.8)

## 2018-12-29 LAB — COMPLETE METABOLIC PANEL WITH GFR
AG Ratio: 1.7 (calc) (ref 1.0–2.5)
ALT: 33 U/L — AB (ref 6–29)
AST: 23 U/L (ref 10–30)
Albumin: 4.4 g/dL (ref 3.6–5.1)
Alkaline phosphatase (APISO): 104 U/L (ref 31–125)
BUN: 9 mg/dL (ref 7–25)
CALCIUM: 9.5 mg/dL (ref 8.6–10.2)
CO2: 25 mmol/L (ref 20–32)
CREATININE: 0.89 mg/dL (ref 0.50–1.10)
Chloride: 104 mmol/L (ref 98–110)
GFR, EST AFRICAN AMERICAN: 93 mL/min/{1.73_m2} (ref >=60)
GFR, EST NON AFRICAN AMERICAN: 81 mL/min/{1.73_m2} (ref >=60)
GLUCOSE: 81 mg/dL (ref 65–99)
Globulin: 2.6 g/dL (calc) (ref 1.9–3.7)
Potassium: 4.1 mmol/L (ref 3.5–5.3)
Sodium: 137 mmol/L (ref 135–146)
TOTAL PROTEIN: 7 g/dL (ref 6.1–8.1)
Total Bilirubin: 0.5 mg/dL (ref 0.2–1.2)

## 2018-12-29 LAB — HIV-1 RNA QUANT-NO REFLEX-BLD
HIV 1 RNA Quant: <20 copies/mL
HIV-1 RNA QUANT, LOG: NOT DETECTED {Log_copies}/mL

## 2018-12-29 LAB — RPR: RPR Ser Ql: NONREACTIVE

## 2019-01-25 ENCOUNTER — Other Ambulatory Visit: Payer: Self-pay | Admitting: Infectious Disease

## 2019-01-25 DIAGNOSIS — B20 Human immunodeficiency virus [HIV] disease: Secondary | ICD-10-CM

## 2019-02-05 ENCOUNTER — Other Ambulatory Visit: Payer: Self-pay | Admitting: Internal Medicine

## 2019-02-07 IMAGING — MR MR HEAD WO/W CM
12 series · 48 of 48 positions shown · IV contrast (multihance)
Comparison: None.

CLINICAL DATA: Right temporal headache.  Right ear pain

EXAM:
MRI HEAD WITHOUT AND WITH CONTRAST
TECHNIQUE: Multiplanar, multiecho pulse sequences of the brain and surrounding
structures were obtained without and with intravenous contrast.
CONTRAST:  20mL MULTIHANCE GADOBENATE DIMEGLUMINE 529 MG/ML IV SOLN

[Series 2: t1_se_sag · sagittal · 5.0mm · 0.45mm/px · 1 of 23 slices shown]
[im 1/23]
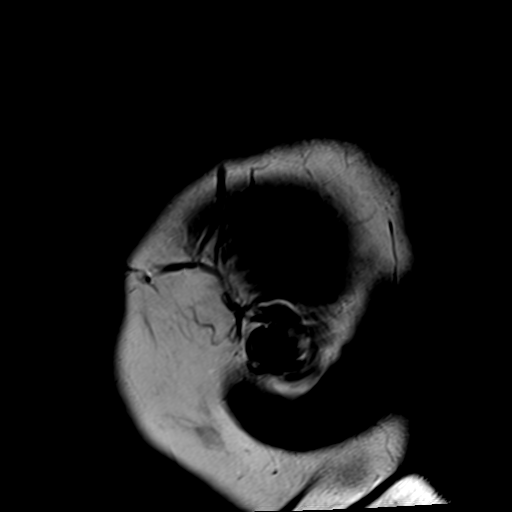

[Series 3: ep2d_diff_(id)_trace · axial · 3.0mm · 1.80mm/px · z∈[-23,+128]mm · 5 of 101 slices shown]
[im 1/101]
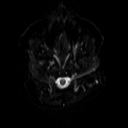
[im 26/101]
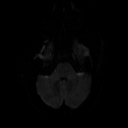
[im 51/101]
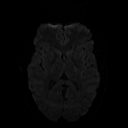
[im 76/101]
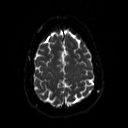
[im 101/101]
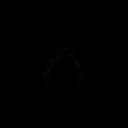

[Series 4: ep2d_diff_(id)_trace_adc · axial · 3.0mm · 1.80mm/px · z∈[-23,+128]mm · 3 of 52 slices shown]
[im 1/52]
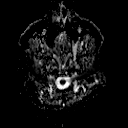
[im 26/52]
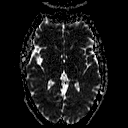
[im 52/52]
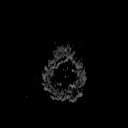

[Series 5: ep2d_diff_cor · coronal · 5.0mm · 1.77mm/px · 4 of 59 slices shown]
[im 1/59]
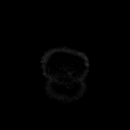
[im 20/59]
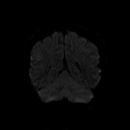
[im 39/59]
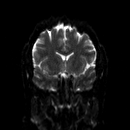
[im 59/59]
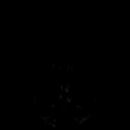

[Series 6: ep2d_diff_cor_adc · coronal · 5.0mm · 1.77mm/px · 2 of 30 slices shown]
[im 1/30]
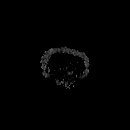
[im 30/30]
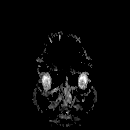

[Series 8: swi_images · axial · 2.0mm · 0.90mm/px · z∈[-24,+132]mm · 5 of 80 slices shown]
[im 1/80]
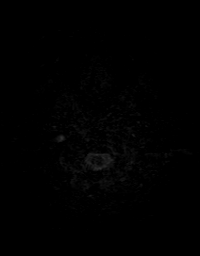
[im 20/80]
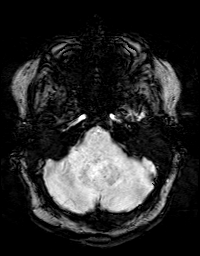
[im 40/80]
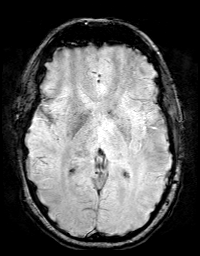
[im 60/80]
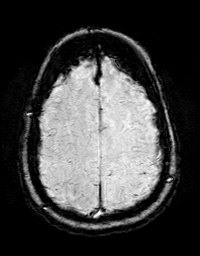
[im 80/80]
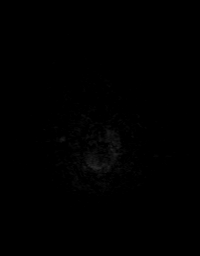

[Series 9: FLAIR · axial · 3.0mm · 0.43mm/px · z∈[-25,+129]mm · 2 of 27 slices shown]
[im 1/27]
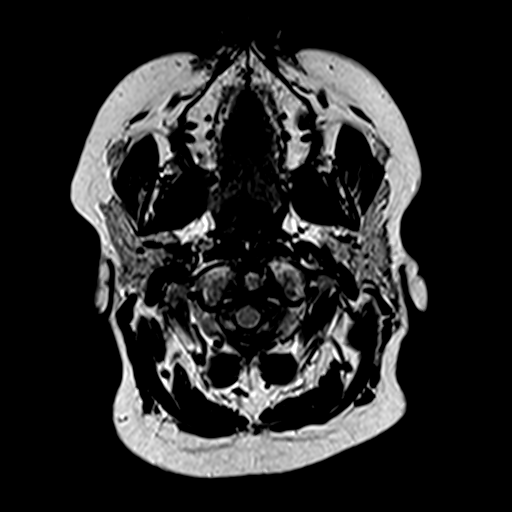
[im 27/27]
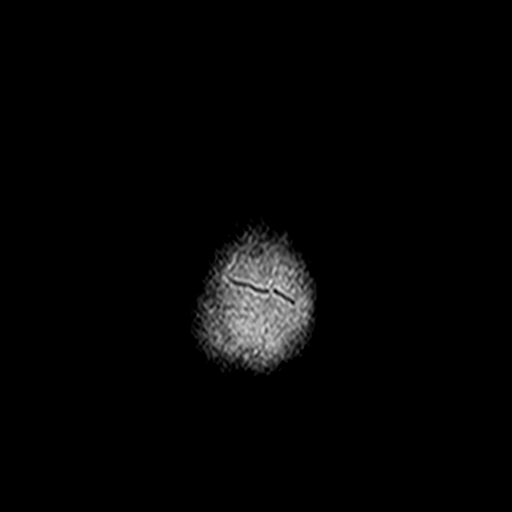

[Series 10: t2_tse_tra_512 · axial · 5.0mm · 0.60mm/px · z∈[-20,+128]mm · 2 of 26 slices shown]
[im 1/26]
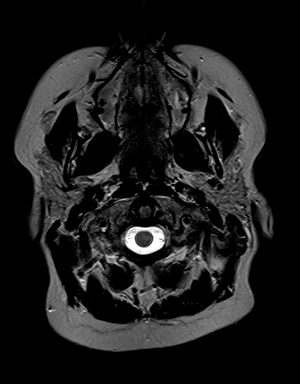
[im 26/26]
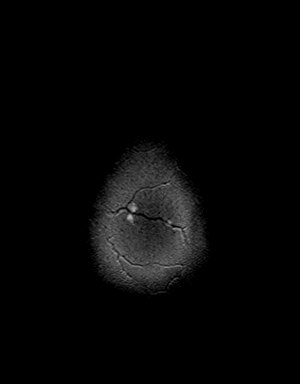

[Series 11: t1_mpr_tra · axial · 1.0mm · 0.72mm/px · z∈[-24,+133]mm · 10 of 160 slices shown]
[im 1/160]
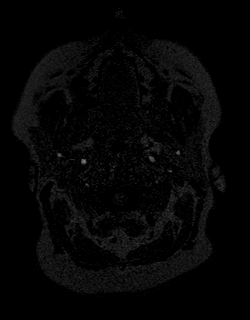
[im 18/160]
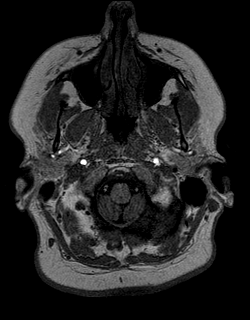
[im 36/160]
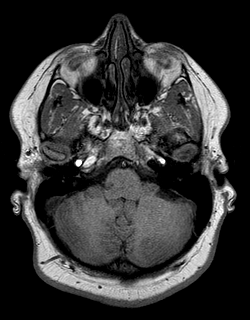
[im 54/160]
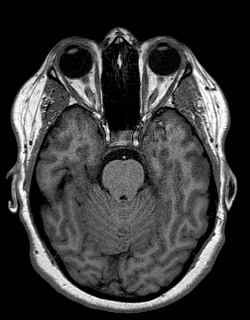
[im 71/160]
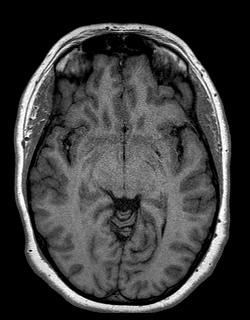
[im 89/160]
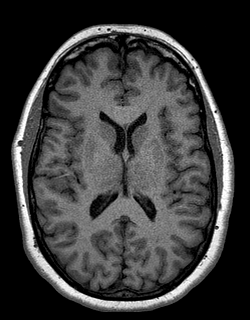
[im 107/160]
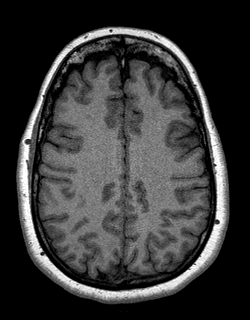
[im 124/160]
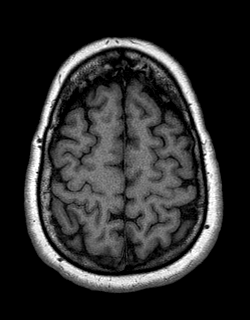
[im 142/160]
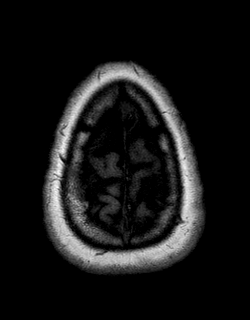
[im 160/160]
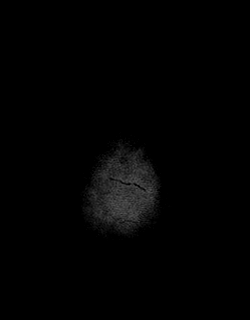

[Series 12: T2 · coronal · 5.0mm · 0.45mm/px · 2 of 32 slices shown]
[im 1/32]
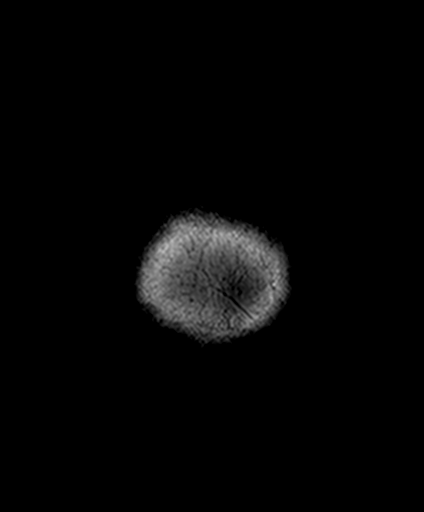
[im 32/32]
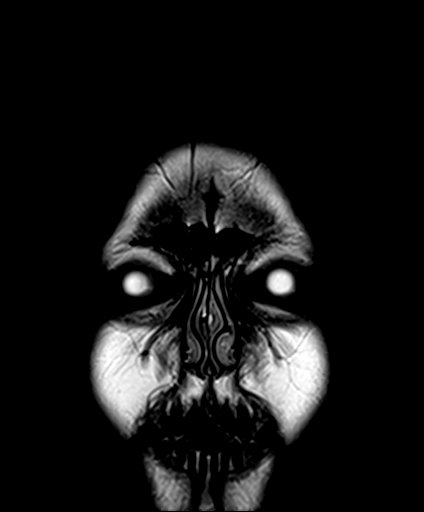

[Series 13: T1 post-contrast · coronal · 5.0mm · 0.72mm/px · 2 of 32 slices shown]
[im 1/32]
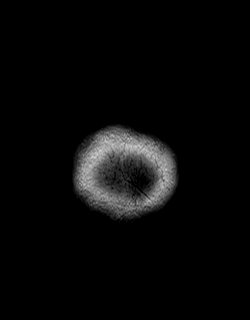
[im 32/32]
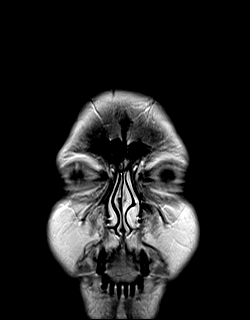

[Series 14: post t1_mpr_tra · axial · 1.0mm · 0.72mm/px · z∈[-24,+133]mm · 10 of 160 slices shown]
[im 1/160]
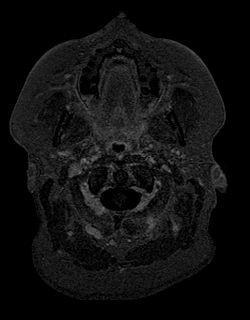
[im 18/160]
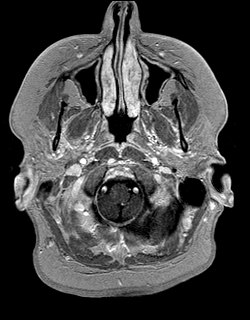
[im 36/160]
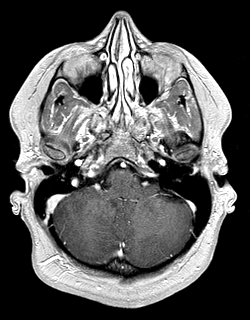
[im 54/160]
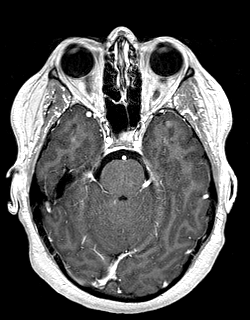
[im 71/160]
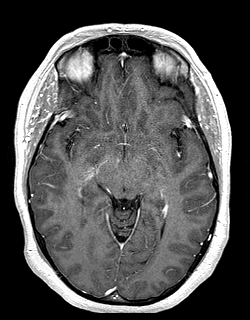
[im 89/160]
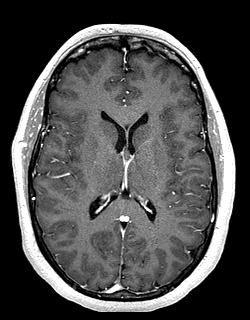
[im 107/160]
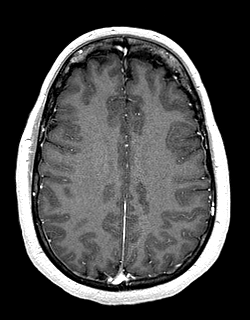
[im 124/160]
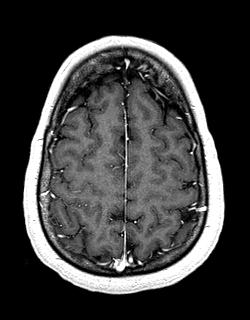
[im 142/160]
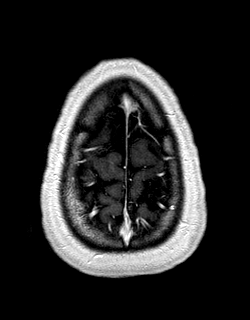
[im 160/160]
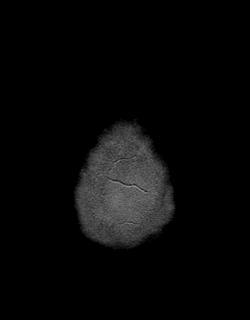

[48 of 48 positions shown; findings below may reference images not displayed]

FINDINGS: Brain: No acute infarction, hemorrhage, hydrocephalus, extra-axial
collection or mass lesion.

Normal enhancement postcontrast administration.

Vascular: Normal arterial flow voids.  Normal venous enhancement.

Skull and upper cervical spine: Normal skull. No abnormal
enhancement of the temporal bone.

Sinuses/Orbits: Mucosal edema throughout the paranasal sinuses.
Normal orbit. Mastoid sinus clear bilaterally

Other: None
IMPRESSION: Normal MRI of the brain with contrast

Mucosal edema paranasal sinuses

## 2019-02-08 ENCOUNTER — Ambulatory Visit (INDEPENDENT_AMBULATORY_CARE_PROVIDER_SITE_OTHER): Payer: 59 | Admitting: Family

## 2019-02-08 ENCOUNTER — Encounter: Payer: Self-pay | Admitting: Family

## 2019-02-08 ENCOUNTER — Other Ambulatory Visit: Payer: Self-pay

## 2019-02-08 DIAGNOSIS — F411 Generalized anxiety disorder: Secondary | ICD-10-CM | POA: Diagnosis not present

## 2019-02-08 NOTE — Progress Notes (Signed)
Subjective:    Patient ID: Julie Fox, female    DOB: 1976/09/05, 43 y.o.   MRN: [id number]  Chief Complaint  Patient presents with  . Anxiety     Virtual Visit via Telephone Note   I connected with Radene Journey on 02/08/2019 at 11:15  am by telephone and verified that I am speaking with the correct person using two identifiers.   I discussed the limitations, risks, security and privacy concerns of performing an evaluation and management service by telephone and the availability of in person appointments. I also discussed with the patient that there may be a patient responsible charge related to this service. The patient expressed understanding and agreed to proceed.   HPI:  Julie Fox is a 43 y.o. female with HIV disease who was last seen in the clinic on 12/27/18 with good adherence and tolerance to her ART regimen of Genvoya. Viral load at this time was undetectable and CD4 count was 1,910 on 02/28/18. Medication for anxiety was adjusted and weaned off fluoxetine and started on paroxetine. She is due for second dose of Menveo with all other healthcare maintenance being up to date.   Julie Fox has successfully weaned off fluoxetine without complications and has started taking paroxetine. She has good tolerance and adherence to the paroxetine and her anxiety has improved with no further weird/bizarre dreams. She is sleeping better. She is also working on non-pharmacological stress relief through exercise. She is an essential employee and continues to work at the present time. Denies feelings of being down, depressed or hopeless.    Allergies  Allergen Reactions  . Penicillins Anaphylaxis    Has patient had a PCN reaction causing immediate rash, facial/tongue/throat swelling, SOB or lightheadedness with hypotension: yes Has patient had a PCN reaction causing severe rash involving mucus membranes or skin necrosis: no Has patient had a PCN reaction that required  hospitalization: no Has patient had a PCN reaction occurring within the last 10 years: yes If all of the above answers are "NO", then may proceed with Cephalosporin use.   . Sulfa Antibiotics Rash      Outpatient Medications Prior to Visit  Medication Sig Dispense Refill  . atorvastatin (LIPITOR) 20 MG tablet TAKE 1 TABLET(20 MG) BY MOUTH DAILY 30 tablet 3  . GENVOYA 150-150-200-10 MG TABS tablet TAKE 1 TABLET BY MOUTH DAILY WITH BREAKFAST 30 tablet 5  . PARoxetine (PAXIL) 20 MG tablet Take 1 tablet (20 mg total) by mouth daily. 30 tablet 11  . FLUoxetine (PROZAC) 40 MG capsule Take 1 capsule (40 mg total) by mouth daily. (Patient not taking: Reported on 02/08/2019) 30 capsule 11   No facility-administered medications prior to visit.      Past Medical History:  Diagnosis Date  . GAD (generalized anxiety disorder)   . GERD (gastroesophageal reflux disease)    no meds, diet controlled  . History of epilepsy    childhood seizures - last one age 63 yr, non since  . HIV (human immunodeficiency virus infection) ([place])   . Hx of abnormal cervical Pap smear    2012 per pt, then normal since   . Migraine    otc med prn  . Obesity   . Pure hypercholesterolemia   . Seasonal allergies      Past Surgical History:  Procedure Laterality Date  . CHOLECYSTECTOMY    . COLONOSCOPY    . TOE SURGERY     age 69-cyst excision per patient  . TONSILLECTOMY  2016  . UPPER GI ENDOSCOPY    . VAGINAL HYSTERECTOMY Bilateral 01/03/2018   Procedure: HYSTERECTOMY VAGINAL WITH SALPINGECTOMY;  Surgeon: Lavonia Drafts, MD;  Location: [facility];  Service: Gynecology;  Laterality: Bilateral;  . WISDOM TOOTH EXTRACTION     x 2 lower       Review of Systems  Constitutional: Negative for appetite change, chills, diaphoresis, fatigue, fever and unexpected weight change.  Eyes:       Negative for acute change in vision  Respiratory: Negative for chest tightness, shortness of breath and wheezing.    Cardiovascular: Negative for chest pain.  Gastrointestinal: Negative for diarrhea, nausea and vomiting.  Genitourinary: Negative for dysuria, pelvic pain and vaginal discharge.  Musculoskeletal: Negative for neck pain and neck stiffness.  Skin: Negative for rash.  Neurological: Negative for seizures, syncope, weakness and headaches.  Hematological: Negative for adenopathy. Does not bruise/bleed easily.  Psychiatric/Behavioral: Negative for agitation, confusion, hallucinations, self-injury, sleep disturbance and suicidal ideas. The patient is not nervous/anxious and is not hyperactive.       Objective:    Nursing note and vital signs reviewed.    Julie Fox is pleasant to speak with and sounds like she is doing well with improved anxiety.  Assessment & Plan:   Problem List Items Addressed This Visit      Other   GAD (generalized anxiety disorder) - Primary    Julie Fox has generalized anxiety disorder with previous fluoxetine resulting in weird dreams and now successfully transitioned to paroxetine with improved symptoms. She has no adverse side effects at present. Working on non-pharmacological stress relief. Continue current dose of paroxetine and follow up with next HIV appointment.           I have discontinued Mellissa L. Maberry's FLUoxetine. I am also having her maintain her PARoxetine, Genvoya, and atorvastatin.   I discussed the assessment and treatment plan with the patient. The patient was provided an opportunity to ask questions and all were answered. The patient agreed with the plan and demonstrated an understanding of the instructions.   The patient was advised to call back or seek an in-person evaluation if the symptoms worsen or if the condition fails to improve as anticipated.   I provided  10 minutes of non-face-to-face time during this encounter.  Follow-up: Return in about 4 months (around 06/10/2019), or if symptoms worsen or fail to improve.   Terri Piedra, MSN, FNP-C Nurse Practitioner [facility] [place] number: [phone]

## 2019-02-08 NOTE — Patient Instructions (Signed)
Nice to speak with you.  I am glad to hear that your anxiety is better controlled with the paroxetine.   Please continue to take the paroxetine as prescribed daily.  Plan for follow up with your normal HIV appointments/follow up.

## 2019-02-08 NOTE — Assessment & Plan Note (Signed)
Julie Fox has generalized anxiety disorder with previous fluoxetine resulting in weird dreams and now successfully transitioned to paroxetine with improved symptoms. She has no adverse side effects at present. Working on non-pharmacological stress relief. Continue current dose of paroxetine and follow up with next HIV appointment.

## 2019-05-29 ENCOUNTER — Other Ambulatory Visit: Payer: Self-pay | Admitting: Obstetrics & Gynecology

## 2019-05-29 DIAGNOSIS — Z1231 Encounter for screening mammogram for malignant neoplasm of breast: Secondary | ICD-10-CM

## 2019-06-04 ENCOUNTER — Other Ambulatory Visit: Payer: Self-pay

## 2019-06-04 ENCOUNTER — Encounter: Payer: Self-pay | Admitting: Internal Medicine

## 2019-06-04 ENCOUNTER — Ambulatory Visit: Payer: 59 | Admitting: Internal Medicine

## 2019-06-04 VITALS — BP 130/82 | HR 80 | Temp 98.0°F

## 2019-06-04 DIAGNOSIS — Z79899 Other long term (current) drug therapy: Secondary | ICD-10-CM | POA: Diagnosis not present

## 2019-06-04 DIAGNOSIS — B20 Human immunodeficiency virus [HIV] disease: Secondary | ICD-10-CM

## 2019-06-04 DIAGNOSIS — F411 Generalized anxiety disorder: Secondary | ICD-10-CM | POA: Diagnosis not present

## 2019-06-04 MED ORDER — PAROXETINE HCL ER 37.5 MG PO TB24: 37.5000 mg | ORAL_TABLET | Freq: Every day | ORAL | 11 refills | Status: DC

## 2019-06-04 NOTE — Progress Notes (Signed)
Patient ID: Julie Fox, female   DOB: 04-Nov-1975, 43 y.o.   MRN: [id number]  HPI Julie Fox is a 43yo F with HIV disease, and also has been suffering with generalized anxiety disorder, now on paroxetine. 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  59yo mother lives with her.  Outpatient Encounter Medications as of 06/04/2019  Medication Sig  . atorvastatin (LIPITOR) 20 MG tablet TAKE 1 TABLET(20 MG) BY MOUTH DAILY  . GENVOYA 150-150-200-10 MG TABS tablet TAKE 1 TABLET BY MOUTH DAILY WITH BREAKFAST  . PARoxetine (PAXIL) 20 MG tablet Take 1 tablet (20 mg total) by mouth daily.   No facility-administered encounter medications on file as of 06/04/2019.      Patient Active Problem List   Diagnosis Date Noted  . Mild single current episode of major depressive disorder ([place]) 06/20/2018  . Post-operative state 01/03/2018  . Abnormal uterine bleeding (AUB) 12/12/2017  . GAD (generalized anxiety disorder) 06/13/2017  . Hyperlipidemia 06/13/2017  . HIV disease ([place]) 12/23/2016     Health Maintenance Due  Topic Date Due  . INFLUENZA VACCINE  05/26/2019     Review of Systems Review of Systems  Constitutional: Negative for fever, chills, diaphoresis, activity change, appetite change, fatigue and unexpected weight change.  HENT: Negative for congestion, sore throat, rhinorrhea, sneezing, trouble swallowing and sinus pressure.  Eyes: Negative for photophobia and visual disturbance.  Respiratory: Negative for cough, chest tightness, shortness of breath, wheezing and stridor.  Cardiovascular: Negative for chest pain, palpitations and leg swelling.  Gastrointestinal: Negative for nausea, vomiting, abdominal pain, diarrhea, constipation, blood in stool, abdominal distention and anal bleeding.   Genitourinary: Negative for dysuria, hematuria, flank pain and difficulty urinating.  Musculoskeletal: Negative for myalgias, back pain, joint swelling, arthralgias and gait problem.  Skin: Negative for color change, pallor, rash and wound.  Neurological: Negative for dizziness, tremors, weakness and light-headedness.  Hematological: Negative for adenopathy. Does not bruise/bleed easily.  Psychiatric/Behavioral: +anxiety   Physical Exam   BP 130/82   Pulse 80   Temp 98 F (36.7 C)   LMP 12/26/2017   Physical Exam  Constitutional:  oriented to person, place, and time. appears well-developed and well-nourished. No distress.  HENT: [place]/AT, PERRLA, no scleral icterus Mouth/Throat: Oropharynx is clear and moist. No oropharyngeal exudate.  Cardiovascular: Normal rate, regular rhythm and normal heart sounds. Exam reveals no gallop and no friction rub.  No murmur heard.  Pulmonary/Chest: Effort normal and breath sounds normal. No respiratory distress.  has no wheezes.  Neck = supple, no nuchal rigidity Abdominal: Soft. Bowel sounds are normal.  exhibits no distension. There is no tenderness.  Lymphadenopathy: no cervical adenopathy. No axillary adenopathy Neurological: alert and oriented to person, place, and time.  Skin: Skin is warm and dry. No rash noted. No erythema.  Psychiatric: a normal mood and affect.  behavior is normal.   Lab Results  Component Value Date   CD4TCELL 56 (H) 02/28/2018   Lab Results  Component Value Date   CD4TABS 1,910 02/28/2018   CD4TABS 1,210 08/30/2017   CD4TABS 1,730 02/23/2017   Lab Results  Component Value Date   HIV1RNAQUANT <20 NOT DETECTED 12/27/2018   Lab Results  Component Value Date   HEPBSAB POS (A) 09/27/2016   Lab Results  Component Value Date   LABRPR  NON-REACTIVE 12/27/2018    CBC Lab Results  Component Value Date   WBC 7.7 12/27/2018   RBC 4.18 12/27/2018   HGB 13.5 12/27/2018   HCT 39.2 12/27/2018   PLT 333 12/27/2018    MCV 93.8 12/27/2018   MCH 32.3 12/27/2018   MCHC 34.4 12/27/2018   RDW 12.6 12/27/2018   LYMPHSABS 2,972 12/27/2018   MONOABS 0.7 11/06/2017   EOSABS 100 12/27/2018    BMET Lab Results  Component Value Date   NA 137 12/27/2018   K 4.1 12/27/2018   CL 104 12/27/2018   CO2 25 12/27/2018   GLUCOSE 81 12/27/2018   BUN 9 12/27/2018   CREATININE 0.89 12/27/2018   CALCIUM 9.5 12/27/2018   GFRNONAA 81 12/27/2018   GFRAA 93 12/27/2018      Assessment and Plan  hiv disease = well controlled. Will check labs . Refill biktarvy  Long term medication management = will check cr to see that still at her baseline  Anxiety = will increase paxil dose to 37.5mg  CR  New relationship = disclosed her hiv status to new partner  Health maintenance = mammo is scheduled for 9/23. Has hx of hysterectomy so unclear when she gets pap

## 2019-06-05 LAB — T-HELPER CELL (CD4) - (RCID CLINIC ONLY)
CD4 % Helper T Cell: 53 % (ref 33–65)
CD4 T Cell Abs: 1553 /uL (ref 400–1790)

## 2019-06-07 LAB — CBC WITH DIFFERENTIAL/PLATELET
Absolute Monocytes: 644 cells/uL (ref 200–950)
Basophils Absolute: 74 cells/uL (ref 0–200)
Basophils Relative: 0.8 %
Eosinophils Absolute: 322 cells/uL (ref 15–500)
Eosinophils Relative: 3.5 %
HCT: 42.8 % (ref 35.0–45.0)
Hemoglobin: 14.5 g/dL (ref 11.7–15.5)
Lymphs Abs: 3137 cells/uL (ref 850–3900)
MCH: 32.1 pg (ref 27.0–33.0)
MCHC: 33.9 g/dL (ref 32.0–36.0)
MCV: 94.7 fL (ref 80.0–100.0)
MPV: 11 fL (ref 7.5–12.5)
Monocytes Relative: 7 %
Neutro Abs: 5023 cells/uL (ref 1500–7800)
Neutrophils Relative %: 54.6 %
Platelets: 338 10*3/uL (ref 140–400)
RBC: 4.52 10*6/uL (ref 3.80–5.10)
RDW: 12.6 % (ref 11.0–15.0)
Total Lymphocyte: 34.1 %
WBC: 9.2 10*3/uL (ref 3.8–10.8)

## 2019-06-07 LAB — COMPLETE METABOLIC PANEL WITH GFR
AG Ratio: 1.7 (calc) (ref 1.0–2.5)
ALT: 43 U/L — ABNORMAL HIGH (ref 6–29)
AST: 24 U/L (ref 10–30)
Albumin: 4.7 g/dL (ref 3.6–5.1)
Alkaline phosphatase (APISO): 104 U/L (ref 31–125)
BUN: 13 mg/dL (ref 7–25)
CO2: 27 mmol/L (ref 20–32)
Calcium: 10 mg/dL (ref 8.6–10.2)
Chloride: 101 mmol/L (ref 98–110)
Creat: 0.88 mg/dL (ref 0.50–1.10)
GFR, Est African American: 95 mL/min/{1.73_m2} (ref >=60)
GFR, Est Non African American: 82 mL/min/{1.73_m2} (ref >=60)
Globulin: 2.7 g/dL (calc) (ref 1.9–3.7)
Glucose, Bld: 94 mg/dL (ref 65–99)
Potassium: 4.4 mmol/L (ref 3.5–5.3)
Sodium: 139 mmol/L (ref 135–146)
Total Bilirubin: 0.3 mg/dL (ref 0.2–1.2)
Total Protein: 7.4 g/dL (ref 6.1–8.1)

## 2019-06-07 LAB — HIV-1 RNA QUANT-NO REFLEX-BLD
HIV 1 RNA Quant: <20 copies/mL
HIV-1 RNA Quant, Log: <1.3 Log copies/mL

## 2019-06-07 LAB — RPR: RPR Ser Ql: NONREACTIVE

## 2019-06-16 ENCOUNTER — Other Ambulatory Visit: Payer: Self-pay | Admitting: Internal Medicine

## 2019-07-16 ENCOUNTER — Other Ambulatory Visit: Payer: Self-pay

## 2019-07-16 ENCOUNTER — Encounter: Payer: Self-pay | Admitting: Internal Medicine

## 2019-07-16 ENCOUNTER — Ambulatory Visit: Payer: 59 | Admitting: Internal Medicine

## 2019-07-16 VITALS — BP 152/81 | HR 112 | Temp 98.0°F

## 2019-07-16 DIAGNOSIS — F419 Anxiety disorder, unspecified: Secondary | ICD-10-CM

## 2019-07-16 DIAGNOSIS — L02222 Furuncle of back [any part, except buttock]: Secondary | ICD-10-CM

## 2019-07-16 DIAGNOSIS — Z23 Encounter for immunization: Secondary | ICD-10-CM | POA: Diagnosis not present

## 2019-07-16 DIAGNOSIS — B20 Human immunodeficiency virus [HIV] disease: Secondary | ICD-10-CM | POA: Diagnosis not present

## 2019-07-16 MED ORDER — PAROXETINE HCL 40 MG PO TABS: 40.0000 mg | ORAL_TABLET | Freq: Every day | ORAL | 11 refills | Status: DC

## 2019-07-16 NOTE — Progress Notes (Signed)
RFV: follow up for hiv disease/anxiety  Patient ID: Julie Fox, female   DOB: 01-17-1976, 43 y.o.   MRN: [id number]  HPI Julie Fox is a 43yo F with hiv disease, well controlled, also has anxiety. At last appt she had dosage increase of paxil to 37.5mg  CR last visit in mid august.she states that she had significant out of pocket cost- $50/mo. That she can't afford. She has noticed some improvement with increased dosage. She also noticed furuncle/boil on her back that has been tender. Drained when expressed  Outpatient Encounter Medications as of 07/16/2019  Medication Sig  . atorvastatin (LIPITOR) 20 MG tablet TAKE 1 TABLET(20 MG) BY MOUTH DAILY  . GENVOYA 150-150-200-10 MG TABS tablet TAKE 1 TABLET BY MOUTH DAILY WITH BREAKFAST  . PARoxetine (PAXIL CR) 37.5 MG 24 hr tablet Take 1 tablet (37.5 mg total) by mouth daily.   No facility-administered encounter medications on file as of 07/16/2019.      Patient Active Problem List   Diagnosis Date Noted  . Mild single current episode of major depressive disorder ([place]) 06/20/2018  . Post-operative state 01/03/2018  . Abnormal uterine bleeding (AUB) 12/12/2017  . GAD (generalized anxiety disorder) 06/13/2017  . Hyperlipidemia 06/13/2017  . HIV disease ([place]) 12/23/2016     Health Maintenance Due  Topic Date Due  . INFLUENZA VACCINE  05/26/2019    Social History   Tobacco Use  . Smoking status: Former Smoker    Packs/day: 0.25    Years: 20.00    Pack years: 5.00    Types: Cigarettes    Start date: 12/24/1995  . Smokeless tobacco: Never Used  . Tobacco comment: cutting back. chantix did not work.  Substance Use Topics  . Alcohol use: No    Frequency: Never    Comment: once a month  . Drug use: No   Review of Systems 12 point ros is negative except what is mentioned in hpi Physical Exam   BP (!) 152/81   Pulse (!) 112   Temp 98 F (36.7 C)   LMP 12/26/2017  Physical Exam  Constitutional:  oriented to person, place,  and time. appears well-developed and well-nourished. No distress.  HENT: [place]/AT, PERRLA, no scleral icterus Mouth/Throat: Oropharynx is clear and moist. No oropharyngeal exudate.  Cardiovascular: Normal rate, regular rhythm and normal heart sounds. Exam reveals no gallop and no friction rub.  No murmur heard.  Pulmonary/Chest: Effort normal and breath sounds normal. No respiratory distress.  has no wheezes.  Neck = supple, no nuchal rigidity Abdominal: Soft. Bowel sounds are normal.  exhibits no distension. There is no tenderness.  Lymphadenopathy: no cervical adenopathy. No axillary adenopathy Neurological: alert and oriented to person, place, and time.  Skin: Skin is warm and dry. No rash noted. No erythema.  Psychiatric: a normal mood and affect.  behavior is normal.    Lab Results  Component Value Date   CD4TCELL 53 06/04/2019   Lab Results  Component Value Date   CD4TABS 1,553 06/04/2019   CD4TABS 1,910 02/28/2018   CD4TABS 1,210 08/30/2017   Lab Results  Component Value Date   HIV1RNAQUANT <20 NOT DETECTED 06/04/2019   Lab Results  Component Value Date   HEPBSAB POS (A) 09/27/2016   Lab Results  Component Value Date   LABRPR NON-REACTIVE 06/04/2019    CBC Lab Results  Component Value Date   WBC 9.2 06/04/2019   RBC 4.52 06/04/2019   HGB 14.5 06/04/2019   HCT 42.8 06/04/2019   PLT  338 06/04/2019   MCV 94.7 06/04/2019   MCH 32.1 06/04/2019   MCHC 33.9 06/04/2019   RDW 12.6 06/04/2019   LYMPHSABS 3,137 06/04/2019   MONOABS 0.7 11/06/2017   EOSABS 322 06/04/2019    BMET Lab Results  Component Value Date   NA 139 06/04/2019   K 4.4 06/04/2019   CL 101 06/04/2019   CO2 27 06/04/2019   GLUCOSE 94 06/04/2019   BUN 13 06/04/2019   CREATININE 0.88 06/04/2019   CALCIUM 10.0 06/04/2019   GFRNONAA 82 06/04/2019   GFRAA 95 06/04/2019     Assessment and Plan  HIV disease= well controlled, continue on genvoya  Anxiety = will change her to paxil 40mg   daily, generic instead of 37.5mg CR dosage  Furuncle vs clogged cyst = hot compress. Wash with soap and water. Does not appear to need abtx  Health maintenance = flu shot today

## 2019-07-17 ENCOUNTER — Ambulatory Visit: Payer: 59 | Admitting: Student

## 2019-07-18 ENCOUNTER — Ambulatory Visit
Admission: RE | Admit: 2019-07-18 | Discharge: 2019-07-18 | Disposition: A | Discharge status: Home / Self Care | Payer: 59 | Source: Ambulatory Visit | Attending: Obstetrics & Gynecology | Admitting: Obstetrics & Gynecology

## 2019-07-18 ENCOUNTER — Other Ambulatory Visit: Payer: Self-pay

## 2019-07-18 DIAGNOSIS — Z1231 Encounter for screening mammogram for malignant neoplasm of breast: Secondary | ICD-10-CM

## 2019-07-27 ENCOUNTER — Other Ambulatory Visit: Payer: Self-pay

## 2019-07-27 ENCOUNTER — Encounter: Payer: Self-pay | Admitting: Obstetrics & Gynecology

## 2019-07-27 ENCOUNTER — Ambulatory Visit (INDEPENDENT_AMBULATORY_CARE_PROVIDER_SITE_OTHER): Payer: 59 | Admitting: Obstetrics & Gynecology

## 2019-07-27 VITALS — BP 129/68 | HR 80 | Ht 68.0 in | Wt 286.1 lb

## 2019-07-27 DIAGNOSIS — Z01419 Encounter for gynecological examination (general) (routine) without abnormal findings: Secondary | ICD-10-CM | POA: Diagnosis not present

## 2019-07-27 NOTE — Progress Notes (Signed)
Subjective:     Julie Fox is a 43 y.o. female here for a routine exam.  Current complaints: none. Pt reports that she feels great since her surgery.  No GYN complaints.      Gynecologic History Patient's last menstrual period was 12/26/2017. Contraception: status post hysterectomy Last Pap:  Last mammogram: 07/18/2019. Results were: normal  Obstetric History OB History  Gravida Para Term Preterm AB Living  0 0 0 0 0 0  SAB TAB Ectopic Multiple Live Births  0 0 0 0 0    The following portions of the patient's history were reviewed and updated as appropriate: allergies, current medications, past family history, past medical history, past social history, past surgical history and problem list.  Review of Systems Pertinent items are noted in HPI.    Objective:  BP 129/68   Pulse 80   Ht 5\' 8"  (1.727 m)   Wt 286 lb 1.9 oz (129.8 kg)   LMP 12/26/2017   BMI 43.50 kg/m  General Appearance:    Alert, cooperative, no distress, appears stated age  Head:    Normocephalic, without obvious abnormality, atraumatic  Eyes:    conjunctiva/corneas clear, EOM's intact, both eyes  Ears:    Normal external ear canals, both ears  Nose:   Nares normal, septum midline, mucosa normal, no drainage    or sinus tenderness  Throat:   Lips, mucosa, and tongue normal; teeth and gums normal  Neck:   Supple, symmetrical, trachea midline, no adenopathy;    thyroid:  no enlargement/tenderness/nodules  Back:     Symmetric, no curvature, ROM normal, no CVA tenderness  Lungs:     respirations unlabored  Chest Wall:    No tenderness or deformity   Heart:    Regular rate and rhythm  Breast Exam:    No tenderness, masses, or nipple abnormality; nipple piercing's   Abdomen:     Soft, non-tender, bowel sounds active all four quadrants,    no masses, no organomegaly  Genitalia:    Normal female without lesion, discharge or tenderness     Extremities:   Extremities normal, atraumatic, no cyanosis or edema   Pulses:   2+ and symmetric all extremities  Skin:   Skin color, texture, turgor normal, no rashes or lesions    Assessment:    Healthy female exam.    Plan:    Follow up in: 1 year.    Annual mammogram   Asriel Westrup L. Harraway-Smith, M.D., Cherlynn June

## 2019-07-30 ENCOUNTER — Other Ambulatory Visit: Payer: Self-pay | Admitting: Internal Medicine

## 2019-07-30 DIAGNOSIS — B20 Human immunodeficiency virus [HIV] disease: Secondary | ICD-10-CM

## 2019-07-31 ENCOUNTER — Encounter: Payer: Self-pay | Admitting: Obstetrics & Gynecology

## 2019-10-03 ENCOUNTER — Telehealth: Payer: Self-pay

## 2019-10-03 NOTE — Telephone Encounter (Signed)
Per Dr. Ihor Dow patient needs referral to healthy weight management. Kathrene Alu RN

## 2019-11-19 ENCOUNTER — Ambulatory Visit (INDEPENDENT_AMBULATORY_CARE_PROVIDER_SITE_OTHER): Payer: 59 | Admitting: Internal Medicine

## 2019-11-19 ENCOUNTER — Other Ambulatory Visit: Payer: Self-pay

## 2019-11-19 DIAGNOSIS — B20 Human immunodeficiency virus [HIV] disease: Secondary | ICD-10-CM

## 2019-11-19 DIAGNOSIS — R5383 Other fatigue: Secondary | ICD-10-CM | POA: Diagnosis not present

## 2019-11-19 DIAGNOSIS — F411 Generalized anxiety disorder: Secondary | ICD-10-CM

## 2019-11-19 NOTE — Progress Notes (Signed)
  RFV: follow up for hiv disease - phone visit, we used 2 identifiers, patient reached at home, provider in clinic  Patient ID: Julie Fox, female   DOB: May 24, 1976, 44 y.o.   MRN: [id number]  HPI 44 yo F with well controlled hiv disease, and hx of anxiety/panic disorder. She states that she genvoya did not take for a week, since she had been caring for her mom at the hospital for 1 week.   She has recently started on aHealthy weight program  Interested in covid vaccine  Outpatient Encounter Medications as of 11/19/2019  Medication Sig  . atorvastatin (LIPITOR) 20 MG tablet TAKE 1 TABLET(20 MG) BY MOUTH DAILY  . GENVOYA 150-150-200-10 MG TABS tablet TAKE 1 TABLET BY MOUTH DAILY WITH BREAKFAST  . PARoxetine (PAXIL) 40 MG tablet Take 1 tablet (40 mg total) by mouth daily.   No facility-administered encounter medications on file as of 11/19/2019.     Patient Active Problem List   Diagnosis Date Noted  . Mild single current episode of major depressive disorder ([place]) 06/20/2018  . Post-operative state 01/03/2018  . Abnormal uterine bleeding (AUB) 12/12/2017  . GAD (generalized anxiety disorder) 06/13/2017  . Hyperlipidemia 06/13/2017  . HIV disease ([place]) 12/23/2016     There are no preventive care reminders to display for this patient.   Review of Systems +fatigue,  Hot at night at night in the 3 months. 12 systems otherwise negative Physical Exam   No exam  Lab Results  Component Value Date   CD4TCELL 53 06/04/2019   Lab Results  Component Value Date   CD4TABS 1,553 06/04/2019   CD4TABS 1,910 02/28/2018   CD4TABS 1,210 08/30/2017   Lab Results  Component Value Date   HIV1RNAQUANT <20 NOT DETECTED 06/04/2019   Lab Results  Component Value Date   HEPBSAB POS (A) 09/27/2016   Lab Results  Component Value Date   LABRPR NON-REACTIVE 06/04/2019    CBC Lab Results  Component Value Date   WBC 9.2 06/04/2019   RBC 4.52 06/04/2019   HGB 14.5 06/04/2019   HCT 42.8 06/04/2019   PLT 338 06/04/2019   MCV 94.7 06/04/2019   MCH 32.1 06/04/2019   MCHC 33.9 06/04/2019   RDW 12.6 06/04/2019   LYMPHSABS 3,137 06/04/2019   MONOABS 0.7 11/06/2017   EOSABS 322 06/04/2019    BMET Lab Results  Component Value Date   NA 139 06/04/2019   K 4.4 06/04/2019   CL 101 06/04/2019   CO2 27 06/04/2019   GLUCOSE 94 06/04/2019   BUN 13 06/04/2019   CREATININE 0.88 06/04/2019   CALCIUM 10.0 06/04/2019   GFRNONAA 82 06/04/2019   GFRAA 95 06/04/2019      Assessment and Plan   hiv disease = importance of adherence. Continue- genvoya. Will need labs on day  Health maintenance = recommend  Fatigue= will check thyroid function  Anxiety= stable, continue on current regimen

## 2019-12-05 ENCOUNTER — Ambulatory Visit (INDEPENDENT_AMBULATORY_CARE_PROVIDER_SITE_OTHER): Payer: 59 | Admitting: Family Medicine

## 2019-12-05 ENCOUNTER — Encounter (INDEPENDENT_AMBULATORY_CARE_PROVIDER_SITE_OTHER): Payer: Self-pay | Admitting: Family Medicine

## 2019-12-05 ENCOUNTER — Other Ambulatory Visit: Payer: Self-pay

## 2019-12-05 VITALS — BP 123/79 | HR 73 | Temp 97.6°F | Ht 67.0 in | Wt 283.0 lb

## 2019-12-05 DIAGNOSIS — Z0289 Encounter for other administrative examinations: Secondary | ICD-10-CM

## 2019-12-05 DIAGNOSIS — E559 Vitamin D deficiency, unspecified: Secondary | ICD-10-CM | POA: Diagnosis not present

## 2019-12-05 DIAGNOSIS — R5383 Other fatigue: Secondary | ICD-10-CM

## 2019-12-05 DIAGNOSIS — R0602 Shortness of breath: Secondary | ICD-10-CM

## 2019-12-05 DIAGNOSIS — F418 Other specified anxiety disorders: Secondary | ICD-10-CM | POA: Diagnosis not present

## 2019-12-05 DIAGNOSIS — Z6841 Body Mass Index (BMI) 40.0 and over, adult: Secondary | ICD-10-CM

## 2019-12-05 DIAGNOSIS — E7849 Other hyperlipidemia: Secondary | ICD-10-CM

## 2019-12-05 DIAGNOSIS — Z9189 Other specified personal risk factors, not elsewhere classified: Secondary | ICD-10-CM

## 2019-12-05 NOTE — Progress Notes (Signed)
Dear Dr. Ihor Dow,   Thank you for referring Julie Fox to our clinic. The following note includes my evaluation and treatment recommendations.  Chief Complaint:   OBESITY Julie Fox (MR# [id number]) is a 44 y.o. female who presents for evaluation and treatment of obesity and related comorbidities. [name] was referred to our clinic by Lavonia Drafts, MD. Current BMI is Body mass index is 44.32 kg/m.Julie Fox has been struggling with her weight for many years and has been unsuccessful in either losing weight, maintaining weight loss, or reaching her healthy weight goal.  Julie Fox is currently in the action stage of change and ready to dedicate time achieving and maintaining a healthier weight. Julie Fox is interested in becoming our patient and working on intensive lifestyle modifications including (but not limited to) diet and exercise for weight loss.  Julie Fox's habits were reviewed today and are as follows: she thinks her family will eat healthier with her, her desired weight loss is 83 pounds, she has been heavy most of her life, she started gaining weight in the last year and a half, her heaviest weight ever was 285 pounds, she is a picky eater and doesn't like to eat healthier foods, she has significant food cravings issues, she snacks frequently in the evenings, she skips meals frequently, she is frequently drinking liquids with calories and she struggles with emotional eating.  Julie Fox works third shift. She eats around 6:00 or 7:00 PM and has grilled chicken (1 breast), 1 1/2 cups of green beans and 1/2 cup of rice (feels full). Julie Fox has a snack of pretzels or chips out of the bag.  Depression Screen Julie Fox's Food and Mood (modified PHQ-9) score was mildly positive.  Depression screen PHQ 2/9 12/05/2019  Decreased Interest 1  Down, Depressed, Hopeless 1  PHQ - 2 Score 2  Altered sleeping 0  Tired, decreased energy 1  Change in appetite 2    Feeling bad or failure about yourself  0  Trouble concentrating 0  Moving slowly or fidgety/restless 0  Suicidal thoughts 0  PHQ-9 Score 5  Difficult doing work/chores Not difficult at all   Subjective:   Other fatigue  Astha admits to daytime somnolence and admits to waking up still tired. Patent has a history of symptoms of daytime fatigue and morning fatigue. Julie Fox generally gets 4 to 6 hours of sleep per night, and states that she has generally restful sleep. Snoring is present. Apneic episodes are not present. Epworth Sleepiness Score is 3. EKG ordered today show T-wave inversion in lead I and lead II with heart rate of 78.  Shortness of breath on exertion Julie Fox notes increasing shortness of breath with exercising and seems to be worsening over time with weight gain. She notes getting out of breath sooner with activity than she used to. This has not gotten worse recently. [name] denies shortness of breath at rest or orthopnea.   Vitamin D deficiency Julie Fox has a historical diagnosis of vitamin D deficiency. She is not on vit D supplement. She admits fatigue.  Other hyperlipidemia  Julie Fox has hyperlipidemia and she is attempting to improve her cholesterol levels with intensive lifestyle modification including a low saturated fat diet, exercise and weight loss. Julie Fox had a high LDL of 174. She is on Atorvastatin 20 mg daily. She denies myalgias.  Lab Results  Component Value Date   ALT 43 (H) 06/04/2019   AST 24 06/04/2019   ALKPHOS 86 02/23/2017   BILITOT 0.3 06/04/2019   Lab  Results  Component Value Date   CHOL 152 06/20/2018   HDL 45.60 06/20/2018   LDLCALC 80 06/20/2018   TRIG 131.0 06/20/2018   CHOLHDL 3 06/20/2018   Depression with anxiety Julie Fox is on Paxil 40 mg daily. Her symptoms are well controlled. Paxil is prescribed by Dr. Baxter Fox.  At risk for deficient intake of food The patient is at a higher than average risk of deficient intake of food due to  recent food recall.  Assessment/Plan:   Other fatigue  Julie Fox does feel that her weight is causing her energy to be lower than it should be. Fatigue may be related to obesity, depression or many other causes. Labs and EKG will be ordered, and in the meanwhile, Julie Fox will focus on self care including making healthy food choices, increasing physical activity and focusing on stress reduction.  Shortness of breath on exertion Julie Fox does feel that she gets out of breath more easily that she used to when she exercises. Julie Fox's shortness of breath appears to be obesity related and exercise induced. She has agreed to work on weight loss and gradually increase exercise to treat her exercise induced shortness of breath. Labs and indirect calorimetry will be ordered today. We will continue to monitor closely.  Vitamin D deficiency  Low Vitamin D level contributes to fatigue and are associated with obesity, breast, and colon cancer. We will check vitamin D level today.  Other hyperlipidemia  Cardiovascular risk and specific lipid/LDL goals reviewed.  We discussed several lifestyle modifications today and Julie Fox will begin to work on diet, exercise and weight loss efforts. We will check fasting lipid panel and CMP today. Orders and follow up as documented in patient record.   Counseling Intensive lifestyle modifications are the first line treatment for this issue. . Dietary changes: Increase soluble fiber. Decrease simple carbohydrates. . Exercise changes: Moderate to vigorous-intensity aerobic activity 150 minutes per week if tolerated. . Lipid-lowering medications: see documented in medical record.  Depression with anxiety Patient is to follow up concerning management.  At risk for deficient intake of food Julie Fox was given approximately 15 minutes of deficit intake of food prevention counseling today. Julie Fox is at risk for eating too few calories based on current food recall. She was  encouraged to focus on meeting caloric and protein goals according to her recommended meal plan.   Class 3 severe obesity with serious comorbidity and body mass index (BMI) of 40.0 to 44.9 in adult, unspecified obesity type [facility]) Ciarrah is currently in the action stage of change and her goal is to continue with weight loss efforts. I recommend Myelle begin the structured treatment plan as follows:  She has agreed to the Category 3 Plan.  Exercise goals: No exercise has been prescribed at this time.   Behavioral modification strategies: increasing lean protein intake, increasing vegetables and meal planning and cooking strategies.  She was informed of the importance of frequent follow-up visits to maximize her success with intensive lifestyle modifications for her multiple health conditions. She was informed we would discuss her lab results at her next visit unless there is a critical issue that needs to be addressed sooner. Valiyah agreed to keep her next visit at the agreed upon time to discuss these results.  Objective:   Blood pressure 123/79, pulse 73, temperature 97.6 F (36.4 C), temperature source Oral, height 5\' 7"  (1.702 m), weight 283 lb (128.4 kg), last menstrual period 12/26/2017, SpO2 100 %. Body mass index is 44.32 kg/m.  EKG: Normal  sinus rhythm, rate 78 BPM.  Indirect Calorimeter completed today shows a VO2 of 319 and a REE of 2221.  Her calculated basal metabolic rate is [id number] thus her basal metabolic rate is better than expected.  General: Cooperative, alert, well developed, in no acute distress. HEENT: Conjunctivae and lids unremarkable. Cardiovascular: Regular rhythm.  Lungs: Normal work of breathing. Neurologic: No focal deficits.   Lab Results  Component Value Date   CREATININE 0.88 06/04/2019   BUN 13 06/04/2019   NA 139 06/04/2019   K 4.4 06/04/2019   CL 101 06/04/2019   CO2 27 06/04/2019   Lab Results  Component Value Date   ALT 43 (H) 06/04/2019    AST 24 06/04/2019   ALKPHOS 86 02/23/2017   BILITOT 0.3 06/04/2019   Lab Results  Component Value Date   HGBA1C 5.8 06/20/2018   HGBA1C 5.7 06/13/2017   No results found for: INSULIN Lab Results  Component Value Date   TSH 1.75 04/05/2018   Lab Results  Component Value Date   CHOL 152 06/20/2018   HDL 45.60 06/20/2018   LDLCALC 80 06/20/2018   TRIG 131.0 06/20/2018   CHOLHDL 3 06/20/2018   Lab Results  Component Value Date   WBC 9.2 06/04/2019   HGB 14.5 06/04/2019   HCT 42.8 06/04/2019   MCV 94.7 06/04/2019   PLT 338 06/04/2019   No results found for: IRON, TIBC, FERRITIN   Attestation Statements:   Reviewed by clinician on day of visit: allergies, medications, problem list, medical history, surgical history, family history, social history, and previous encounter notes.  I, Doreene Nest, am acting as Location manager for Eber Jones, Md.  I have reviewed the above documentation for accuracy and completeness, and I agree with the above. - Ilene Qua, MD

## 2019-12-06 LAB — COMPREHENSIVE METABOLIC PANEL
ALT: 95 IU/L — ABNORMAL HIGH (ref 0–32)
AST: 67 IU/L — ABNORMAL HIGH (ref 0–40)
Albumin/Globulin Ratio: 1.9 (ref 1.2–2.2)
Albumin: 4.8 g/dL (ref 3.8–4.8)
Alkaline Phosphatase: 130 IU/L — ABNORMAL HIGH (ref 39–117)
BUN/Creatinine Ratio: 14 (ref 9–23)
BUN: 11 mg/dL (ref 6–24)
Bilirubin Total: 0.4 mg/dL (ref 0.0–1.2)
CO2: 22 mmol/L (ref 20–29)
Calcium: 9.9 mg/dL (ref 8.7–10.2)
Chloride: 100 mmol/L (ref 96–106)
Creatinine, Ser: 0.81 mg/dL (ref 0.57–1.00)
GFR calc Af Amer: 104 mL/min/{1.73_m2} (ref >59)
GFR calc non Af Amer: 90 mL/min/{1.73_m2} (ref >59)
Globulin, Total: 2.5 g/dL (ref 1.5–4.5)
Glucose: 99 mg/dL (ref 65–99)
Potassium: 4.4 mmol/L (ref 3.5–5.2)
Sodium: 138 mmol/L (ref 134–144)
Total Protein: 7.3 g/dL (ref 6.0–8.5)

## 2019-12-06 LAB — LIPID PANEL WITH LDL/HDL RATIO
Cholesterol, Total: 192 mg/dL (ref 100–199)
HDL: 39 mg/dL — ABNORMAL LOW (ref >39)
LDL Chol Calc (NIH): 108 mg/dL — ABNORMAL HIGH (ref 0–99)
LDL/HDL Ratio: 2.8 ratio (ref 0.0–3.2)
Triglycerides: 259 mg/dL — ABNORMAL HIGH (ref 0–149)
VLDL Cholesterol Cal: 45 mg/dL — ABNORMAL HIGH (ref 5–40)

## 2019-12-06 LAB — T4, FREE: Free T4: 1.07 ng/dL (ref 0.82–1.77)

## 2019-12-06 LAB — CBC WITH DIFFERENTIAL/PLATELET
Basophils Absolute: 0.1 10*3/uL (ref 0.0–0.2)
Basos: 1 %
EOS (ABSOLUTE): 0.2 10*3/uL (ref 0.0–0.4)
Eos: 3 %
Hematocrit: 41.7 % (ref 34.0–46.6)
Hemoglobin: 14.4 g/dL (ref 11.1–15.9)
Immature Grans (Abs): 0 10*3/uL (ref 0.0–0.1)
Immature Granulocytes: 0 %
Lymphocytes Absolute: 2.8 10*3/uL (ref 0.7–3.1)
Lymphs: 34 %
MCH: 32.9 pg (ref 26.6–33.0)
MCHC: 34.5 g/dL (ref 31.5–35.7)
MCV: 95 fL (ref 79–97)
Monocytes Absolute: 0.5 10*3/uL (ref 0.1–0.9)
Monocytes: 6 %
Neutrophils Absolute: 4.6 10*3/uL (ref 1.4–7.0)
Neutrophils: 56 %
Platelets: 299 10*3/uL (ref 150–450)
RBC: 4.38 x10E6/uL (ref 3.77–5.28)
RDW: 12.3 % (ref 11.7–15.4)
WBC: 8.2 10*3/uL (ref 3.4–10.8)

## 2019-12-06 LAB — HEMOGLOBIN A1C
Est. average glucose Bld gHb Est-mCnc: 126 mg/dL
Hgb A1c MFr Bld: 6 % — ABNORMAL HIGH (ref 4.8–5.6)

## 2019-12-06 LAB — VITAMIN B12: Vitamin B-12: 486 pg/mL (ref 232–1245)

## 2019-12-06 LAB — INSULIN, RANDOM: INSULIN: 20.8 u[IU]/mL (ref 2.6–24.9)

## 2019-12-06 LAB — FOLATE: Folate: 2.7 ng/mL — ABNORMAL LOW (ref >3.0)

## 2019-12-06 LAB — TSH: TSH: 2.85 u[IU]/mL (ref 0.450–4.500)

## 2019-12-06 LAB — VITAMIN D 25 HYDROXY (VIT D DEFICIENCY, FRACTURES): Vit D, 25-Hydroxy: 13.2 ng/mL — ABNORMAL LOW (ref 30.0–100.0)

## 2019-12-06 LAB — T3: T3, Total: 137 ng/dL (ref 71–180)

## 2019-12-19 ENCOUNTER — Encounter (INDEPENDENT_AMBULATORY_CARE_PROVIDER_SITE_OTHER): Payer: Self-pay | Admitting: Family Medicine

## 2019-12-19 ENCOUNTER — Other Ambulatory Visit: Payer: Self-pay

## 2019-12-19 ENCOUNTER — Ambulatory Visit (INDEPENDENT_AMBULATORY_CARE_PROVIDER_SITE_OTHER): Payer: 59 | Admitting: Family Medicine

## 2019-12-19 VITALS — BP 115/75 | HR 59 | Temp 97.6°F | Ht 67.0 in | Wt 282.0 lb

## 2019-12-19 DIAGNOSIS — Z9189 Other specified personal risk factors, not elsewhere classified: Secondary | ICD-10-CM

## 2019-12-19 DIAGNOSIS — E559 Vitamin D deficiency, unspecified: Secondary | ICD-10-CM | POA: Diagnosis not present

## 2019-12-19 DIAGNOSIS — R7989 Other specified abnormal findings of blood chemistry: Secondary | ICD-10-CM

## 2019-12-19 DIAGNOSIS — E7849 Other hyperlipidemia: Secondary | ICD-10-CM

## 2019-12-19 DIAGNOSIS — R7303 Prediabetes: Secondary | ICD-10-CM

## 2019-12-19 DIAGNOSIS — Z6841 Body Mass Index (BMI) 40.0 and over, adult: Secondary | ICD-10-CM

## 2019-12-19 MED ORDER — VITAMIN D (ERGOCALCIFEROL) 1.25 MG (50000 UNIT) PO CAPS: 50000.0000 [IU] | ORAL_CAPSULE | ORAL | 0 refills | Status: DC

## 2019-12-20 ENCOUNTER — Other Ambulatory Visit: Payer: Self-pay

## 2019-12-20 MED ORDER — ATORVASTATIN CALCIUM 20 MG PO TABS: ORAL_TABLET | ORAL | 3 refills | Status: DC

## 2019-12-20 NOTE — Progress Notes (Signed)
Chief Complaint:   Julie Fox is here to discuss her progress with her obesity treatment plan along with follow-up of her obesity related diagnoses. Julie Fox is on the Category 3 Plan and states she is following her eating plan approximately 45% of the time. Julie Fox states she is walking on the treadmill 45 to 60 minutes 2 times per week.  Today's visit was #: 2 Starting weight: 283 lbs Starting date: 12/05/2019 Today's weight: 282 lbs Today's date: 12/19/2019 Total lbs lost to date: 1 Total lbs lost since last in-office visit: 1  Interim History: Julie Fox voices the quantity of food is possible too large for what she is capable of eating. For snacks, patient was using the snack size trail mix. Julie Fox is working third shift, and she is often only eating approximately two meals a day.  Subjective:   Prediabetes Julie Fox has a diagnosis of prediabetes and her last Hgb A1c was 6.0 and last insulin level was 20.8. Julie Fox was diagnosed several years ago. She was informed this puts her at greater risk of developing diabetes. She continues to work on diet and exercise to decrease her risk of diabetes. Labs were discussed with patient today.  Lab Results  Component Value Date   HGBA1C 6.0 (H) 12/05/2019   Lab Results  Component Value Date   INSULIN 20.8 12/05/2019   Other hyperlipidemia Julie Fox has hyperlipidemia and she has LDL of 108, HDL of 39 and triglycerides of 259. She is on Lipitor (liver enzymes elevated). She has been trying to improve her cholesterol levels with intensive lifestyle modification including a low saturated fat diet, exercise and weight loss. Labs were discussed with patient today.  Lab Results  Component Value Date   ALT 95 (H) 12/05/2019   AST 67 (H) 12/05/2019   ALKPHOS 130 (H) 12/05/2019   BILITOT 0.4 12/05/2019   Lab Results  Component Value Date   CHOL 192 12/05/2019   HDL 39 (L) 12/05/2019   LDLCALC 108 (H) 12/05/2019   TRIG 259  (H) 12/05/2019   CHOLHDL 3 06/20/2018   Elevated LFTs Julie Fox has worsening elevated LFTs. Her last ultrasound of the abdomen in 2011, showed slight fatty change. Her last LFT in August 2020, only the ALT was elevated. Her BMI is over 40. Labs were discussed with patient today.  Lab Results  Component Value Date   ALT 95 (H) 12/05/2019   AST 67 (H) 12/05/2019   ALKPHOS 130 (H) 12/05/2019   BILITOT 0.4 12/05/2019   Vitamin D deficiency  Julie Fox has a new diagnosis of vitamin D deficiency. Julie Fox's last Vitamin D level was 13.2 on 12/05/19. She is not on vitamin D supplementation. She admits fatigue. Labs were discussed with patient today.  At risk for impaired function of liver Julie Fox is at risk for impaired function of liver due to likely diagnosis of fatty liver as evidenced by recent elevated liver enzymes.   Assessment/Plan:   Prediabetes Julie Fox will continue to work on weight loss, exercise, and decreasing simple carbohydrates to help decrease the risk of diabetes. We will check Hgb A1c and insulin level in 3 months. No medications at this time.  Other hyperlipidemia Cardiovascular risk and specific lipid/LDL goals reviewed. We discussed several lifestyle modifications today and Julie Fox will continue to work on diet, exercise and weight loss efforts. We will repeat fasting lipid panel in 3 months. Orders and follow up as documented in patient record.   Elevated LFTs We discussed the likely diagnosis of  non-alcoholic fatty liver disease today and how this condition is obesity related. Julie Fox was educated the importance of weight loss. Julie Fox agreed to continue with her weight loss efforts with healthier diet and exercise as an essential part of her treatment plan. Abdominal ultrasound, if normal, patient will need to follow up with [name] about medication.  Vitamin D deficiency  Low Vitamin D level contributes to fatigue and are associated with obesity, breast, and colon  cancer. Julie Fox agrees to start prescription Vitamin D @50 ,000 IU every week #4 with no refills and she will follow-up for routine testing of Vitamin D, at least 2-3 times per year to avoid over-replacement.  At risk for impaired function of liver Julie Fox was given approximately 15 minutes of counseling today regarding prevention of impaired liver function. Julie Fox was educated about her risk of developing NASH or even liver failure and advised that the only proven treatment for NAFLD was weight loss of at least 5-10% of body weight.   Class 3 severe obesity with serious comorbidity and body mass index (BMI) of 40.0 to 44.9 in adult, unspecified obesity type [facility]) Julie Fox is currently in the action stage of change. As such, her goal is to continue with weight loss efforts. She has agreed to the Category 2 Plan +100 calories.   Behavioral modification strategies: increasing lean protein intake, increasing vegetables, meal planning and cooking strategies, keeping healthy foods in the home and planning for success.  Julie Fox has agreed to follow-up with our clinic in 2 weeks. She was informed of the importance of frequent follow-up visits to maximize her success with intensive lifestyle modifications for her multiple health conditions.   Objective:   Blood pressure 115/75, pulse (!) 59, temperature 97.6 F (36.4 C), temperature source Oral, height 5\' 7"  (1.702 m), weight 282 lb (127.9 kg), last menstrual period 12/26/2017, SpO2 93 %. Body mass index is 44.17 kg/m.  General: Cooperative, alert, well developed, in no acute distress. HEENT: Conjunctivae and lids unremarkable. Cardiovascular: Regular rhythm.  Lungs: Normal work of breathing. Neurologic: No focal deficits.   Lab Results  Component Value Date   CREATININE 0.81 12/05/2019   BUN 11 12/05/2019   NA 138 12/05/2019   K 4.4 12/05/2019   CL 100 12/05/2019   CO2 22 12/05/2019   Lab Results  Component Value Date   ALT 95 (H)  12/05/2019   AST 67 (H) 12/05/2019   ALKPHOS 130 (H) 12/05/2019   BILITOT 0.4 12/05/2019   Lab Results  Component Value Date   HGBA1C 6.0 (H) 12/05/2019   HGBA1C 5.8 06/20/2018   HGBA1C 5.7 06/13/2017   Lab Results  Component Value Date   INSULIN 20.8 12/05/2019   Lab Results  Component Value Date   TSH 2.850 12/05/2019   Lab Results  Component Value Date   CHOL 192 12/05/2019   HDL 39 (L) 12/05/2019   LDLCALC 108 (H) 12/05/2019   TRIG 259 (H) 12/05/2019   CHOLHDL 3 06/20/2018   Lab Results  Component Value Date   WBC 8.2 12/05/2019   HGB 14.4 12/05/2019   HCT 41.7 12/05/2019   MCV 95 12/05/2019   PLT 299 12/05/2019   No results found for: IRON, TIBC, FERRITIN   Ref. Range 12/05/2019 12:52  Vitamin D, 25-Hydroxy Latest Ref Range: 30.0 - 100.0 ng/mL 13.2 (L)    Attestation Statements:   Reviewed by clinician on day of visit: allergies, medications, problem list, medical history, surgical history, family history, social history, and previous encounter notes.  I, Doreene Nest, am acting as transcriptionist for Coralie Common, MD.  I have reviewed the above documentation for accuracy and completeness, and I agree with the above. - Ilene Qua, MD

## 2020-01-03 ENCOUNTER — Other Ambulatory Visit: Payer: Self-pay

## 2020-01-03 ENCOUNTER — Encounter (INDEPENDENT_AMBULATORY_CARE_PROVIDER_SITE_OTHER): Payer: Self-pay | Admitting: Family Medicine

## 2020-01-03 ENCOUNTER — Ambulatory Visit (INDEPENDENT_AMBULATORY_CARE_PROVIDER_SITE_OTHER): Payer: 59 | Admitting: Family Medicine

## 2020-01-03 VITALS — BP 138/80 | HR 92 | Temp 98.3°F | Ht 67.0 in | Wt 283.0 lb

## 2020-01-03 DIAGNOSIS — E559 Vitamin D deficiency, unspecified: Secondary | ICD-10-CM | POA: Diagnosis not present

## 2020-01-03 DIAGNOSIS — R7303 Prediabetes: Secondary | ICD-10-CM

## 2020-01-03 DIAGNOSIS — Z6841 Body Mass Index (BMI) 40.0 and over, adult: Secondary | ICD-10-CM | POA: Diagnosis not present

## 2020-01-03 NOTE — Progress Notes (Signed)
Chief Complaint:   OBESITY Julie Fox is here to discuss her progress with her obesity treatment plan along with follow-up of her obesity related diagnoses. Julie Fox is on the Category 2 Plan +100 calories and states she is following her eating plan approximately 50% of the time. Julie Fox states she is exercising for 0 minutes 0 times per week.  Today's visit was #: 3 Starting weight: 283 lbs Starting date: 12/05/2019 Today's weight: 283 lbs Today's date: 01/03/2020 Total lbs lost to date: 0 Total lbs lost since last in-office visit: 0  Interim History: Julie Fox is eating around 2 meals a day and is feeling very bloated and uncomfortable.  She says she is ding snack packs, nuts, apples, and hummus.  She reports sleeping for almost 4 days during the past 2 weeks and only ate around 3-4 times in 4 days.  Subjective:   1. Vitamin D deficiency Julie Fox's Vitamin D level was 13.2 on 12/05/2019. She is currently taking vit D. She denies nausea, vomiting or muscle weakness.  She endorses fatigue.  2. Prediabetes Julie Fox has a diagnosis of prediabetes based on her elevated HgA1c and was informed this puts her at greater risk of developing diabetes. She continues to work on diet and exercise to decrease her risk of diabetes. She denies nausea or hypoglycemia.  She is not taking metformin.  She has not been eating much but has been craving carbs.  Lab Results  Component Value Date   HGBA1C 6.0 (H) 12/05/2019   Lab Results  Component Value Date   INSULIN 20.8 12/05/2019   Assessment/Plan:   1. Vitamin D deficiency Low Vitamin D level contributes to fatigue and are associated with obesity, breast, and colon cancer. She agrees to continue to take prescription Vitamin D @50 ,000 IU every week and will follow-up for routine testing of Vitamin D, at least 2-3 times per year to avoid over-replacement.  2. Prediabetes Julie Fox will continue to work on weight loss, exercise, and decreasing simple  carbohydrates to help decrease the risk of diabetes.  Will follow-up on labs in 3 months.  3. Class 3 severe obesity with serious comorbidity and body mass index (BMI) of 40.0 to 44.9 in adult, unspecified obesity type [facility]) [name] is currently in the action stage of change. As such, her goal is to continue with weight loss efforts. She has agreed to keeping a food journal and adhering to recommended goals of 1400-1500 calories and 85+ grams of protein.   Exercise goals: For substantial health benefits, adults should do at least 150 minutes (2 hours and 30 minutes) a week of moderate-intensity, or 75 minutes (1 hour and 15 minutes) a week of vigorous-intensity aerobic physical activity, or an equivalent combination of moderate- and vigorous-intensity aerobic activity. Aerobic activity should be performed in episodes of at least 10 minutes, and preferably, it should be spread throughout the week.  Behavioral modification strategies: increasing lean protein intake, increasing vegetables, meal planning and cooking strategies, keeping healthy foods in the home and planning for success.  [name] has agreed to follow-up with our clinic in 2 weeks. She was informed of the importance of frequent follow-up visits to maximize her success with intensive lifestyle modifications for her multiple health conditions.   Objective:   Blood pressure 138/80, pulse 92, temperature 98.3 F (36.8 C), temperature source Oral, height 5\' 7"  (1.702 m), weight 283 lb (128.4 kg), last menstrual period 12/26/2017, SpO2 92 %. Body mass index is 44.32 kg/m.  General: Cooperative, alert, well developed,  in no acute distress. HEENT: Conjunctivae and lids unremarkable. Cardiovascular: Regular rhythm.  Lungs: Normal work of breathing. Neurologic: No focal deficits.   Lab Results  Component Value Date   CREATININE 0.81 12/05/2019   BUN 11 12/05/2019   NA 138 12/05/2019   K 4.4 12/05/2019   CL 100 12/05/2019   CO2 22  12/05/2019   Lab Results  Component Value Date   ALT 95 (H) 12/05/2019   AST 67 (H) 12/05/2019   ALKPHOS 130 (H) 12/05/2019   BILITOT 0.4 12/05/2019   Lab Results  Component Value Date   HGBA1C 6.0 (H) 12/05/2019   HGBA1C 5.8 06/20/2018   HGBA1C 5.7 06/13/2017   Lab Results  Component Value Date   INSULIN 20.8 12/05/2019   Lab Results  Component Value Date   TSH 2.850 12/05/2019   Lab Results  Component Value Date   CHOL 192 12/05/2019   HDL 39 (L) 12/05/2019   LDLCALC 108 (H) 12/05/2019   TRIG 259 (H) 12/05/2019   CHOLHDL 3 06/20/2018   Lab Results  Component Value Date   WBC 8.2 12/05/2019   HGB 14.4 12/05/2019   HCT 41.7 12/05/2019   MCV 95 12/05/2019   PLT 299 12/05/2019   Attestation Statements:   Reviewed by clinician on day of visit: allergies, medications, problem list, medical history, surgical history, family history, social history, and previous encounter notes.  Time spent on visit including pre-visit chart review and post-visit care and charting was 13 minutes.   I, Water quality scientist, CMA, am acting as transcriptionist for Coralie Common, MD.  I have reviewed the above documentation for accuracy and completeness, and I agree with the above. - Ilene Qua, MD

## 2020-01-17 ENCOUNTER — Encounter (INDEPENDENT_AMBULATORY_CARE_PROVIDER_SITE_OTHER): Payer: Self-pay | Admitting: Family Medicine

## 2020-01-17 ENCOUNTER — Other Ambulatory Visit: Payer: Self-pay

## 2020-01-17 ENCOUNTER — Ambulatory Visit (INDEPENDENT_AMBULATORY_CARE_PROVIDER_SITE_OTHER): Payer: 59 | Admitting: Family Medicine

## 2020-01-17 VITALS — BP 116/79 | HR 86 | Temp 97.9°F | Ht 67.0 in | Wt 280.0 lb

## 2020-01-17 DIAGNOSIS — Z9189 Other specified personal risk factors, not elsewhere classified: Secondary | ICD-10-CM | POA: Diagnosis not present

## 2020-01-17 DIAGNOSIS — Z6841 Body Mass Index (BMI) 40.0 and over, adult: Secondary | ICD-10-CM

## 2020-01-17 DIAGNOSIS — R7303 Prediabetes: Secondary | ICD-10-CM

## 2020-01-17 DIAGNOSIS — E559 Vitamin D deficiency, unspecified: Secondary | ICD-10-CM | POA: Diagnosis not present

## 2020-01-17 MED ORDER — VITAMIN D (ERGOCALCIFEROL) 1.25 MG (50000 UNIT) PO CAPS: 50000.0000 [IU] | ORAL_CAPSULE | ORAL | 0 refills | Status: DC

## 2020-01-17 NOTE — Progress Notes (Signed)
Chief Complaint:   Julie Fox is here to discuss her progress with her obesity treatment plan along with follow-up of her obesity related diagnoses. Julie Fox is keeping a food journal and adhering to recommended goals of 1400-1500 calories and 85+ grams of protein and states she is following her eating plan approximately 90% of the time. Julie Fox states she is walking 30-35 minutes 5 times per week.  Today's visit was #: 4 Starting weight: 283 lbs Starting date: 12/05/2019 Today's weight: 280 lbs Today's date: 01/17/2020 Total lbs lost to date: 3 Total lbs lost since last in-office visit: 3  Interim History: Julie Fox did more consistent journaling the last 2 weeks and noticed she ate more food that made her feel full like cottage cheese and has been eating this more consistently. She reports eating at least 3 times a day with a small snack and felt comfortable. Calorie-wise she is eating ~1200-1300 calories and 85 grams of protein daily.  Subjective:   Vitamin D deficiency. No nausea, vomiting, or muscle weakness. She endorses fatigue. Last Vitamin D 13.2 on 12/05/2019.  Prediabetes. Real has a diagnosis of prediabetes based on her elevated HgA1c and was informed this puts her at greater risk of developing diabetes. She continues to work on diet and exercise to decrease her risk of diabetes. She denies nausea or hypoglycemia. Julie Fox reports minimal carb cravings.  Lab Results  Component Value Date   HGBA1C 6.0 (H) 12/05/2019   Lab Results  Component Value Date   INSULIN 20.8 12/05/2019   At risk for osteoporosis. Julie Fox is at higher risk of osteopenia and osteoporosis due to Vitamin D deficiency.   Assessment/Plan:   Vitamin D deficiency. Low Vitamin D level contributes to fatigue and are associated with obesity, breast, and colon cancer. She was given a refill on her Vitamin D, Ergocalciferol, (DRISDOL) 1.25 MG (50000 UNIT) CAPS capsule every week #4 with 0  refills and will follow-up for routine testing of Vitamin D, at least 2-3 times per year to avoid over-replacement.       Prediabetes. [name] will continue to work on weight loss, exercise, and decreasing simple carbohydrates to help decrease the risk of diabetes. She will continue journaling and will have labs retested in 2 months.  At risk for osteoporosis. Julie Fox was given approximately 15 minutes of osteoporosis prevention counseling today. Julie Fox is at risk for osteopenia and osteoporosis due to her Vitamin D deficiency. She was encouraged to take her Vitamin D and follow her higher calcium diet and increase strengthening exercise to help strengthen her bones and decrease her risk of osteopenia and osteoporosis.  Repetitive spaced learning was employed today to elicit superior memory formation and behavioral change.  Class 3 severe obesity with serious comorbidity and body mass index (BMI) of 40.0 to 44.9 in adult, unspecified obesity type ([name]).  Julie Fox is currently in the action stage of change. As such, her goal is to continue with weight loss efforts. She has agreed to keeping a food journal and adhering to recommended goals of 1400-1500 calories and 90+ grams of protein daily.   Exercise goals: Julie Fox will continue her current exercise regimen.  Behavioral modification strategies: increasing lean protein intake, increasing vegetables, meal planning and cooking strategies, keeping healthy foods in the home and planning for success.  Julie Fox has agreed to follow-up with our clinic in 2 weeks. She was informed of the importance of frequent follow-up visits to maximize her success with intensive lifestyle modifications for her  multiple health conditions.   Objective:   Blood pressure 116/79, pulse 86, temperature 97.9 F (36.6 C), temperature source Oral, height 5\' 7"  (1.702 m), weight 280 lb (127 kg), last menstrual period 12/26/2017, SpO2 96 %. Body mass index is 43.85  kg/m.  General: Cooperative, alert, well developed, in no acute distress. HEENT: Conjunctivae and lids unremarkable. Cardiovascular: Regular rhythm.  Lungs: Normal work of breathing. Neurologic: No focal deficits.   Lab Results  Component Value Date   CREATININE 0.81 12/05/2019   BUN 11 12/05/2019   NA 138 12/05/2019   K 4.4 12/05/2019   CL 100 12/05/2019   CO2 22 12/05/2019   Lab Results  Component Value Date   ALT 95 (H) 12/05/2019   AST 67 (H) 12/05/2019   ALKPHOS 130 (H) 12/05/2019   BILITOT 0.4 12/05/2019   Lab Results  Component Value Date   HGBA1C 6.0 (H) 12/05/2019   HGBA1C 5.8 06/20/2018   HGBA1C 5.7 06/13/2017   Lab Results  Component Value Date   INSULIN 20.8 12/05/2019   Lab Results  Component Value Date   TSH 2.850 12/05/2019   Lab Results  Component Value Date   CHOL 192 12/05/2019   HDL 39 (L) 12/05/2019   LDLCALC 108 (H) 12/05/2019   TRIG 259 (H) 12/05/2019   CHOLHDL 3 06/20/2018   Lab Results  Component Value Date   WBC 8.2 12/05/2019   HGB 14.4 12/05/2019   HCT 41.7 12/05/2019   MCV 95 12/05/2019   PLT 299 12/05/2019   No results found for: IRON, TIBC, FERRITIN  Attestation Statements:   Reviewed by clinician on day of visit: allergies, medications, problem list, medical history, surgical history, family history, social history, and previous encounter notes.  I, Michaelene Song, am acting as transcriptionist for Coralie Common, MD   I have reviewed the above documentation for accuracy and completeness, and I agree with the above. - Ilene Qua, MD

## 2020-01-21 ENCOUNTER — Other Ambulatory Visit: Payer: Self-pay

## 2020-01-21 DIAGNOSIS — E785 Hyperlipidemia, unspecified: Secondary | ICD-10-CM

## 2020-01-21 DIAGNOSIS — B20 Human immunodeficiency virus [HIV] disease: Secondary | ICD-10-CM

## 2020-01-21 DIAGNOSIS — F320 Major depressive disorder, single episode, mild: Secondary | ICD-10-CM

## 2020-01-21 MED ORDER — ATORVASTATIN CALCIUM 20 MG PO TABS: ORAL_TABLET | ORAL | 3 refills | Status: DC

## 2020-01-21 MED ORDER — PAROXETINE HCL 40 MG PO TABS: 40.0000 mg | ORAL_TABLET | Freq: Every day | ORAL | 11 refills | Status: DC

## 2020-01-21 MED ORDER — GENVOYA 150-150-200-10 MG PO TABS: 1.0000 | ORAL_TABLET | Freq: Every day | ORAL | 5 refills | Status: DC

## 2020-01-24 ENCOUNTER — Ambulatory Visit: Payer: 59 | Attending: Internal Medicine

## 2020-01-24 DIAGNOSIS — Z23 Encounter for immunization: Secondary | ICD-10-CM

## 2020-01-24 NOTE — Progress Notes (Signed)
   Covid-19 Vaccination Clinic  Name:  Julie Fox    MRN: [id number] DOB: February 03, 1976  01/24/2020  Julie Fox was observed post Covid-19 immunization for 30 minutes based on pre-vaccination screening without incident. She was provided with Vaccine Information Sheet and instruction to access the V-Safe system.   Julie Fox was instructed to call 911 with any severe reactions post vaccine: Marland Kitchen Difficulty breathing  . Swelling of face and throat  . A fast heartbeat  . A bad rash all over body  . Dizziness and weakness   Immunizations Administered    Name Date Dose VIS Date Route   Pfizer COVID-19 Vaccine 01/24/2020  4:38 PM 0.3 mL 10/05/2019 Intramuscular   Manufacturer: [place]   Lot: [id number]   [place]: [id number]

## 2020-02-05 ENCOUNTER — Encounter (INDEPENDENT_AMBULATORY_CARE_PROVIDER_SITE_OTHER): Payer: Self-pay | Admitting: Family Medicine

## 2020-02-07 ENCOUNTER — Ambulatory Visit (INDEPENDENT_AMBULATORY_CARE_PROVIDER_SITE_OTHER): Payer: 59 | Admitting: Family Medicine

## 2020-02-18 ENCOUNTER — Ambulatory Visit: Payer: 59

## 2020-02-20 ENCOUNTER — Encounter (INDEPENDENT_AMBULATORY_CARE_PROVIDER_SITE_OTHER): Payer: Self-pay | Admitting: Family Medicine

## 2020-02-21 ENCOUNTER — Ambulatory Visit (INDEPENDENT_AMBULATORY_CARE_PROVIDER_SITE_OTHER): Payer: 59 | Admitting: Family Medicine

## 2020-02-25 ENCOUNTER — Other Ambulatory Visit (INDEPENDENT_AMBULATORY_CARE_PROVIDER_SITE_OTHER): Payer: Self-pay | Admitting: Family Medicine

## 2020-02-25 ENCOUNTER — Ambulatory Visit: Payer: 59 | Attending: Internal Medicine

## 2020-02-25 DIAGNOSIS — E559 Vitamin D deficiency, unspecified: Secondary | ICD-10-CM

## 2020-02-25 DIAGNOSIS — Z23 Encounter for immunization: Secondary | ICD-10-CM

## 2020-02-25 NOTE — Progress Notes (Signed)
   Covid-19 Vaccination Clinic  Name:  Julie Fox    MRN: [id number] DOB: [birth year]  02/25/2020  Julie Fox was observed post Covid-19 immunization for 30 minutes based on pre-vaccination screening without incident. She was provided with Vaccine Information Sheet and instruction to access the V-Safe system.   Julie Fox was instructed to call 911 with any severe reactions post vaccine: Marland Kitchen Difficulty breathing  . Swelling of face and throat  . A fast heartbeat  . A bad rash all over body  . Dizziness and weakness   Immunizations Administered    Name Date Dose VIS Date Route   Pfizer COVID-19 Vaccine 02/25/2020  3:40 PM 0.3 mL 12/19/2018 Intramuscular   Manufacturer: [place]   Lot: [id number]   [place]: [id number]

## 2020-02-26 ENCOUNTER — Encounter (INDEPENDENT_AMBULATORY_CARE_PROVIDER_SITE_OTHER): Payer: Self-pay | Admitting: Family Medicine

## 2020-02-26 ENCOUNTER — Ambulatory Visit (INDEPENDENT_AMBULATORY_CARE_PROVIDER_SITE_OTHER): Payer: 59 | Admitting: Family Medicine

## 2020-02-26 ENCOUNTER — Other Ambulatory Visit: Payer: Self-pay

## 2020-02-26 VITALS — BP 120/70 | HR 93 | Temp 97.6°F | Ht 67.0 in | Wt 284.0 lb

## 2020-02-26 DIAGNOSIS — Z9189 Other specified personal risk factors, not elsewhere classified: Secondary | ICD-10-CM

## 2020-02-26 DIAGNOSIS — Z6841 Body Mass Index (BMI) 40.0 and over, adult: Secondary | ICD-10-CM

## 2020-02-26 DIAGNOSIS — E559 Vitamin D deficiency, unspecified: Secondary | ICD-10-CM | POA: Diagnosis not present

## 2020-02-26 DIAGNOSIS — R7303 Prediabetes: Secondary | ICD-10-CM | POA: Diagnosis not present

## 2020-02-26 MED ORDER — VITAMIN D (ERGOCALCIFEROL) 1.25 MG (50000 UNIT) PO CAPS: 50000.0000 [IU] | ORAL_CAPSULE | ORAL | 0 refills | Status: DC

## 2020-02-26 MED ORDER — METFORMIN HCL 500 MG PO TABS: 500.0000 mg | ORAL_TABLET | Freq: Every day | ORAL | 0 refills | Status: DC

## 2020-02-26 NOTE — Progress Notes (Signed)
Chief Complaint:   OBESITY Julie Fox is here to discuss her progress with her obesity treatment plan along with follow-up of her obesity related diagnoses. Julie Fox is on keeping a food journal and adhering to recommended goals of 1400-1500 calories and 90+ grams of protein and states she is following her eating plan approximately 40% of the time. Julie Fox states she is walking for 30 minutes 3 times per week.  Today's visit was #: 5 Starting weight: 283 lbs Starting date: 12/05/2019 Today's weight: 284 lbs Today's date: 02/26/2020 Total lbs lost to date: 0 Total lbs lost since last in-office visit: 0  Interim History: Julie Fox has been in [place] in court for 3 weeks of April.  She has been driving back and forth to [employer].  She is feeling discouraged with her lack of weight loss.  She is now on day shift and thinks that will encourage a better eating pattern.  Subjective:   1. Prediabetes Julie Fox has a diagnosis of prediabetes based on her elevated HgA1c and was informed this puts her at greater risk of developing diabetes. She continues to work on diet and exercise to decrease her risk of diabetes. She denies nausea or hypoglycemia.  She is not on medication.  Lab Results  Component Value Date   HGBA1C 6.0 (H) 12/05/2019   Lab Results  Component Value Date   INSULIN 20.8 12/05/2019   2. Vitamin D deficiency Julie Fox's Vitamin D level was 13.2 on 12/05/2019. She is currently taking prescription vitamin D 50,000 IU each week. She denies nausea, vomiting or muscle weakness.  She endorses fatigue.  3. At risk for diabetes mellitus Julie Fox is at a higher than average risk for cardiovascular disease due to obesity.   Assessment/Plan:   1. Prediabetes Julie Fox will continue to work on weight loss, exercise, and decreasing simple carbohydrates to help decrease the risk of diabetes.  - metFORMIN (GLUCOPHAGE) 500 MG tablet; Take 1 tablet (500 mg total) by mouth daily with  breakfast.  Dispense: 30 tablet; Refill: 0  2. Vitamin D deficiency Low Vitamin D level contributes to fatigue and are associated with obesity, breast, and colon cancer. She agrees to continue to take prescription Vitamin D @50 ,000 IU every week and will follow-up for routine testing of Vitamin D, at least 2-3 times per year to avoid over-replacement. - Vitamin D, Ergocalciferol, (DRISDOL) 1.25 MG (50000 UNIT) CAPS capsule; Take 1 capsule (50,000 Units total) by mouth every 7 (seven) days.  Dispense: 4 capsule; Refill: 0  3. At risk for diabetes mellitus Julie Fox was given approximately 15 minutes of diabetes education and counseling today. We discussed intensive lifestyle modifications today with an emphasis on weight loss as well as increasing exercise and decreasing simple carbohydrates in her diet. We also reviewed medication options with an emphasis on risk versus benefit of those discussed.   Repetitive spaced learning was employed today to elicit superior memory formation and behavioral change.  4. Class 3 severe obesity with serious comorbidity and body mass index (BMI) of 40.0 to 44.9 in adult, unspecified obesity type [facility]) Julie Fox is currently in the action stage of change. As such, her goal is to continue with weight loss efforts. She has agreed to keeping a food journal and adhering to recommended goals of 1400-1500 calories and 90+ grams of protein.   Exercise goals: As is.  Behavioral modification strategies: increasing lean protein intake, meal planning and cooking strategies, keeping healthy foods in the home and keeping a strict food journal.  Julie Fox has agreed to follow-up with our clinic in 2 weeks. She was informed of the importance of frequent follow-up visits to maximize her success with intensive lifestyle modifications for her multiple health conditions.   Objective:   Blood pressure 120/70, pulse 93, temperature 97.6 F (36.4 C), temperature source Oral, height 5\' 7"   (1.702 m), weight 284 lb (128.8 kg), last menstrual period 12/26/2017, SpO2 98 %. Body mass index is 44.48 kg/m.  General: Cooperative, alert, well developed, in no acute distress. HEENT: Conjunctivae and lids unremarkable. Cardiovascular: Regular rhythm.  Lungs: Normal work of breathing. Neurologic: No focal deficits.   Lab Results  Component Value Date   CREATININE 0.81 12/05/2019   BUN 11 12/05/2019   NA 138 12/05/2019   K 4.4 12/05/2019   CL 100 12/05/2019   CO2 22 12/05/2019   Lab Results  Component Value Date   ALT 95 (H) 12/05/2019   AST 67 (H) 12/05/2019   ALKPHOS 130 (H) 12/05/2019   BILITOT 0.4 12/05/2019   Lab Results  Component Value Date   HGBA1C 6.0 (H) 12/05/2019   HGBA1C 5.8 06/20/2018   HGBA1C 5.7 06/13/2017   Lab Results  Component Value Date   INSULIN 20.8 12/05/2019   Lab Results  Component Value Date   TSH 2.850 12/05/2019   Lab Results  Component Value Date   CHOL 192 12/05/2019   HDL 39 (L) 12/05/2019   LDLCALC 108 (H) 12/05/2019   TRIG 259 (H) 12/05/2019   CHOLHDL 3 06/20/2018   Lab Results  Component Value Date   WBC 8.2 12/05/2019   HGB 14.4 12/05/2019   HCT 41.7 12/05/2019   MCV 95 12/05/2019   PLT 299 12/05/2019   Attestation Statements:   Reviewed by clinician on day of visit: allergies, medications, problem list, medical history, surgical history, family history, social history, and previous encounter notes.  I, Water quality scientist, CMA, am acting as transcriptionist for Coralie Common, MD.  I have reviewed the above documentation for accuracy and completeness, and I agree with the above. - Jinny Blossom, MD

## 2020-03-13 ENCOUNTER — Ambulatory Visit (INDEPENDENT_AMBULATORY_CARE_PROVIDER_SITE_OTHER): Payer: 59 | Admitting: Family Medicine

## 2020-03-13 ENCOUNTER — Other Ambulatory Visit: Payer: Self-pay

## 2020-03-13 ENCOUNTER — Encounter (INDEPENDENT_AMBULATORY_CARE_PROVIDER_SITE_OTHER): Payer: Self-pay | Admitting: Family Medicine

## 2020-03-13 VITALS — BP 127/75 | HR 84 | Temp 97.8°F | Ht 67.0 in | Wt 282.0 lb

## 2020-03-13 DIAGNOSIS — R7303 Prediabetes: Secondary | ICD-10-CM

## 2020-03-13 DIAGNOSIS — Z6841 Body Mass Index (BMI) 40.0 and over, adult: Secondary | ICD-10-CM

## 2020-03-13 DIAGNOSIS — E7849 Other hyperlipidemia: Secondary | ICD-10-CM

## 2020-03-13 DIAGNOSIS — E66813 Obesity, class 3: Secondary | ICD-10-CM

## 2020-03-17 NOTE — Progress Notes (Signed)
Chief Complaint:   Julie Fox is here to discuss her progress with her obesity treatment plan along with follow-up of her obesity related diagnoses. Julie Fox is keeping a food journal and adhering to recommended goals of 1400-1500 calories. Julie Fox states she is walking 15 minutes 5 times per week.  Today's visit was #: 6 Starting weight: 283 lbs Starting date: 12/05/2019 Today's weight: 282 lbs Today's date: 03/13/2020 Total lbs lost to date: 1 Total lbs lost since last in-office visit: 2  Interim History: Julie Fox voices she's had significant GI side effects from metformin, but is still taking it. She eats what she needs calorically, hits her protein goal, and just experiences frequent bowel movements. She has shifted back to working the day shift and feels she is eating more on day shift.  Subjective:   Prediabetes. Julie Fox has a diagnosis of prediabetes based on her elevated HgA1c and was informed this puts her at greater risk of developing diabetes. She continues to work on diet and exercise to decrease her risk of diabetes. She denies nausea or hypoglycemia. Julie Fox reports significant side effects of metformin with frequent diarrhea.  Lab Results  Component Value Date   HGBA1C 6.0 (H) 12/05/2019   Lab Results  Component Value Date   INSULIN 20.8 12/05/2019   Other hyperlipidemia. Julie Fox is not on a statin. She denies myalgias.   Lab Results  Component Value Date   CHOL 192 12/05/2019   HDL 39 (L) 12/05/2019   LDLCALC 108 (H) 12/05/2019   TRIG 259 (H) 12/05/2019   CHOLHDL 3 06/20/2018   Lab Results  Component Value Date   ALT 95 (H) 12/05/2019   AST 67 (H) 12/05/2019   ALKPHOS 130 (H) 12/05/2019   BILITOT 0.4 12/05/2019   The 10-year ASCVD risk score Julie Bussing DC Jr., et al., 2013) is: 4.2%   Values used to calculate the score:     Age: 44 years     Sex: Female     Is Non-Hispanic African American: No     Diabetic: No     Tobacco smoker:  Yes     Systolic Blood Pressure: [id number]     Is BP treated: No     HDL Cholesterol: 39 mg/dL     Total Cholesterol: 192 mg/dL  Assessment/Plan:   Prediabetes. Saphyre will continue to work on weight loss, exercise, and decreasing simple carbohydrates to help decrease the risk of diabetes. She will try to continue metformin, but if GI side effects continue she will stop it.  Other hyperlipidemia. Cardiovascular risk and specific lipid/LDL goals reviewed.  We discussed several lifestyle modifications today and Julie Fox will continue to work on diet, exercise and weight loss efforts. Orders and follow up as documented in patient record. Julie Fox will continue her statin as directed. Will have repeat labs in 1 month.  Counseling Intensive lifestyle modifications are the first line treatment for this issue. . Dietary changes: Increase soluble fiber. Decrease simple carbohydrates. . Exercise changes: Moderate to vigorous-intensity aerobic activity 150 minutes per week if tolerated.  . Lipid-lowering medications: see documented in medical record.  Class 3 severe obesity with serious comorbidity and body mass index (BMI) of 40.0 to 44.9 in adult, unspecified obesity type (Julie Fox).  Julie Fox is currently in the action stage of change. As such, her goal is to continue with weight loss efforts. She has agreed to keeping a food journal and adhering to recommended goals of 1400-1500 calories and 90+ grams of protein  daily.   Exercise goals: No exercise has been prescribed at this time.  Behavioral modification strategies: increasing lean protein intake, increasing vegetables, planning for success and keeping a strict food journal.  Julie Fox has agreed to follow-up with our clinic in 2-3 weeks. She was informed of the importance of frequent follow-up visits to maximize her success with intensive lifestyle modifications for her multiple health conditions.   Objective:   Blood pressure 127/75, pulse 84,  temperature 97.8 F (36.6 C), temperature source Oral, height 5\' 7"  (1.702 m), weight 282 lb (127.9 kg), last menstrual period 12/26/2017, SpO2 93 %. Body mass index is 44.17 kg/m.  General: Cooperative, alert, well developed, in no acute distress. HEENT: Conjunctivae and lids unremarkable. Cardiovascular: Regular rhythm.  Lungs: Normal work of breathing. Neurologic: No focal deficits.   Lab Results  Component Value Date   CREATININE 0.81 12/05/2019   BUN 11 12/05/2019   NA 138 12/05/2019   K 4.4 12/05/2019   CL 100 12/05/2019   CO2 22 12/05/2019   Lab Results  Component Value Date   ALT 95 (H) 12/05/2019   AST 67 (H) 12/05/2019   ALKPHOS 130 (H) 12/05/2019   BILITOT 0.4 12/05/2019   Lab Results  Component Value Date   HGBA1C 6.0 (H) 12/05/2019   HGBA1C 5.8 06/20/2018   HGBA1C 5.7 06/13/2017   Lab Results  Component Value Date   INSULIN 20.8 12/05/2019   Lab Results  Component Value Date   TSH 2.850 12/05/2019   Lab Results  Component Value Date   CHOL 192 12/05/2019   HDL 39 (L) 12/05/2019   LDLCALC 108 (H) 12/05/2019   TRIG 259 (H) 12/05/2019   CHOLHDL 3 06/20/2018   Lab Results  Component Value Date   WBC 8.2 12/05/2019   HGB 14.4 12/05/2019   HCT 41.7 12/05/2019   MCV 95 12/05/2019   PLT 299 12/05/2019   No results found for: IRON, TIBC, FERRITIN  Attestation Statements:   Reviewed by clinician on day of visit: allergies, medications, problem list, medical history, surgical history, family history, social history, and previous encounter notes.  Time spent on visit including pre-visit chart review and post-visit charting and care was 15 minutes.   I, Michaelene Song, am acting as transcriptionist for Coralie Common, MD   I have reviewed the above documentation for accuracy and completeness, and I agree with the above. - Jinny Blossom, MD

## 2020-03-22 ENCOUNTER — Other Ambulatory Visit (INDEPENDENT_AMBULATORY_CARE_PROVIDER_SITE_OTHER): Payer: Self-pay | Admitting: Family Medicine

## 2020-03-22 DIAGNOSIS — E559 Vitamin D deficiency, unspecified: Secondary | ICD-10-CM

## 2020-03-24 ENCOUNTER — Other Ambulatory Visit (INDEPENDENT_AMBULATORY_CARE_PROVIDER_SITE_OTHER): Payer: Self-pay | Admitting: Family Medicine

## 2020-03-24 DIAGNOSIS — R7303 Prediabetes: Secondary | ICD-10-CM

## 2020-04-01 ENCOUNTER — Other Ambulatory Visit: Payer: Self-pay

## 2020-04-01 ENCOUNTER — Encounter (INDEPENDENT_AMBULATORY_CARE_PROVIDER_SITE_OTHER): Payer: Self-pay | Admitting: Family Medicine

## 2020-04-01 ENCOUNTER — Ambulatory Visit (INDEPENDENT_AMBULATORY_CARE_PROVIDER_SITE_OTHER): Payer: 59 | Admitting: Family Medicine

## 2020-04-01 VITALS — BP 123/65 | HR 79 | Temp 98.2°F | Ht 67.0 in | Wt 289.0 lb

## 2020-04-01 DIAGNOSIS — E7849 Other hyperlipidemia: Secondary | ICD-10-CM | POA: Diagnosis not present

## 2020-04-01 DIAGNOSIS — Z6841 Body Mass Index (BMI) 40.0 and over, adult: Secondary | ICD-10-CM

## 2020-04-01 DIAGNOSIS — Z9189 Other specified personal risk factors, not elsewhere classified: Secondary | ICD-10-CM

## 2020-04-01 DIAGNOSIS — E559 Vitamin D deficiency, unspecified: Secondary | ICD-10-CM

## 2020-04-01 MED ORDER — VITAMIN D (ERGOCALCIFEROL) 1.25 MG (50000 UNIT) PO CAPS: 50000.0000 [IU] | ORAL_CAPSULE | ORAL | 0 refills | Status: DC

## 2020-04-01 NOTE — Progress Notes (Signed)
Chief Complaint:   Julie Fox is here to discuss her progress with her obesity treatment plan along with follow-up of her obesity related diagnoses. [name] is keeping a food journal and adhering to recommended goals of 1400-1500 calories and 90 grams of protein and states she is following her eating plan approximately 75% of the time. [name] states she is walking 30 minutes 5 times per week.  Today's visit was #: 7 Starting weight: 283 lbs Starting date: 12/05/2019 Today's weight: 289 lbs Today's date: 04/01/2020 Total lbs lost to date: 0 Total lbs lost since last in-office visit: 0  Interim History: Julie Fox states she was on the plan the first 2 weeks and journaling. Last week she went to [place] for an impromptu trip. Most of the last 4 days she did not eat much secondary to not having many options. Protein wise, she only missed her goal twice. She is not hitting her calorie goal - only getting 800-900 calories. She is often going long stretches without eating.  Subjective:   Vitamin D deficiency. Last Vitamin D was 13.2 on 12/05/2019. No nausea, vomiting, or muscle weakness. [name] is on insulin.  Other hyperlipidemia. Julie Fox is on Lipitor.   Lab Results  Component Value Date   CHOL 192 12/05/2019   HDL 39 (L) 12/05/2019   LDLCALC 108 (H) 12/05/2019   TRIG 259 (H) 12/05/2019   CHOLHDL 3 06/20/2018   Lab Results  Component Value Date   ALT 95 (H) 12/05/2019   AST 67 (H) 12/05/2019   ALKPHOS 130 (H) 12/05/2019   BILITOT 0.4 12/05/2019   The 10-year ASCVD risk score Mikey Bussing DC Jr., et al., 2013) is: 4%   Values used to calculate the score:     Age: 33 years     Sex: Female     Is Non-Hispanic African American: No     Diabetic: No     Tobacco smoker: Yes     Systolic Blood Pressure: 676 mmHg     Is BP treated: No     HDL Cholesterol: 39 mg/dL     Total Cholesterol: 192 mg/dL  At risk for osteoporosis. Julie Fox is at higher risk of osteopenia  and osteoporosis due to Vitamin D deficiency.   Assessment/Plan:   Vitamin D deficiency. Low Vitamin D level contributes to fatigue and are associated with obesity, breast, and colon cancer. She was given a refill on her Vitamin D, Ergocalciferol, (DRISDOL) 1.25 MG (50000 UNIT) CAPS capsule every week #4 with 0 refills and will follow-up for routine testing of Vitamin D, at least 2-3 times per year to avoid over-replacement.   Other hyperlipidemia. Cardiovascular risk and specific lipid/LDL goals reviewed.  We discussed several lifestyle modifications today and Zanaya will continue to work on diet, exercise and weight loss efforts. Orders and follow up as documented in patient record. Will follow-up at her next appointment.  Counseling Intensive lifestyle modifications are the first line treatment for this issue. . Dietary changes: Increase soluble fiber. Decrease simple carbohydrates. . Exercise changes: Moderate to vigorous-intensity aerobic activity 150 minutes per week if tolerated. . Lipid-lowering medications: see documented in medical record.  At risk for osteoporosis. [name] was given approximately 15 minutes of osteoporosis prevention counseling today. Julie Fox is at risk for osteopenia and osteoporosis due to her Vitamin D deficiency. She was encouraged to take her Vitamin D and follow her higher calcium diet and increase strengthening exercise to help strengthen her bones and decrease her risk of  osteopenia and osteoporosis.  Repetitive spaced learning was employed today to elicit superior memory formation and behavioral change.  Class 3 severe obesity with serious comorbidity and body mass index (BMI) of 45.0 to 49.9 in adult, unspecified obesity type ([name]).  Julie Fox is currently in the action stage of change. As such, her goal is to continue with weight loss efforts. She has agreed to keeping a food journal and adhering to recommended goals of 1400-1500 calories and 90+ grams of  protein daily.   Exercise goals: Julie Fox will continue her current exercise regimen.  Behavioral modification strategies: increasing lean protein intake, meal planning and cooking strategies, keeping healthy foods in the home and keeping a strict food journal.  Special has agreed to follow-up with our clinic in 2-3 weeks. She was informed of the importance of frequent follow-up visits to maximize her success with intensive lifestyle modifications for her multiple health conditions.   Objective:   Blood pressure 123/65, pulse 79, temperature 98.2 F (36.8 C), temperature source Oral, height 5\' 7"  (1.702 m), weight 289 lb (131.1 kg), last menstrual period 12/26/2017, SpO2 97 %. Body mass index is 45.26 kg/m.  General: Cooperative, alert, well developed, in no acute distress. HEENT: Conjunctivae and lids unremarkable. Cardiovascular: Regular rhythm.  Lungs: Normal work of breathing. Neurologic: No focal deficits.   Lab Results  Component Value Date   CREATININE 0.81 12/05/2019   BUN 11 12/05/2019   NA 138 12/05/2019   K 4.4 12/05/2019   CL 100 12/05/2019   CO2 22 12/05/2019   Lab Results  Component Value Date   ALT 95 (H) 12/05/2019   AST 67 (H) 12/05/2019   ALKPHOS 130 (H) 12/05/2019   BILITOT 0.4 12/05/2019   Lab Results  Component Value Date   HGBA1C 6.0 (H) 12/05/2019   HGBA1C 5.8 06/20/2018   HGBA1C 5.7 06/13/2017   Lab Results  Component Value Date   INSULIN 20.8 12/05/2019   Lab Results  Component Value Date   TSH 2.850 12/05/2019   Lab Results  Component Value Date   CHOL 192 12/05/2019   HDL 39 (L) 12/05/2019   LDLCALC 108 (H) 12/05/2019   TRIG 259 (H) 12/05/2019   CHOLHDL 3 06/20/2018   Lab Results  Component Value Date   WBC 8.2 12/05/2019   HGB 14.4 12/05/2019   HCT 41.7 12/05/2019   MCV 95 12/05/2019   PLT 299 12/05/2019   No results found for: IRON, TIBC, FERRITIN  Attestation Statements:   Reviewed by clinician on day of visit:  allergies, medications, problem list, medical history, surgical history, family history, social history, and previous encounter notes.  I, Michaelene Song, am acting as transcriptionist for Coralie Common, MD   I have reviewed the above documentation for accuracy and completeness, and I agree with the above. - Jinny Blossom, MD

## 2020-04-22 ENCOUNTER — Other Ambulatory Visit: Payer: Self-pay

## 2020-04-22 ENCOUNTER — Encounter (INDEPENDENT_AMBULATORY_CARE_PROVIDER_SITE_OTHER): Payer: Self-pay | Admitting: Bariatrics

## 2020-04-22 ENCOUNTER — Ambulatory Visit (INDEPENDENT_AMBULATORY_CARE_PROVIDER_SITE_OTHER): Payer: 59 | Admitting: Bariatrics

## 2020-04-22 VITALS — BP 110/76 | HR 83 | Temp 98.0°F | Ht 67.0 in | Wt 287.0 lb

## 2020-04-22 DIAGNOSIS — R7303 Prediabetes: Secondary | ICD-10-CM

## 2020-04-22 DIAGNOSIS — R748 Abnormal levels of other serum enzymes: Secondary | ICD-10-CM

## 2020-04-22 DIAGNOSIS — E785 Hyperlipidemia, unspecified: Secondary | ICD-10-CM

## 2020-04-22 DIAGNOSIS — Z9189 Other specified personal risk factors, not elsewhere classified: Secondary | ICD-10-CM | POA: Diagnosis not present

## 2020-04-22 DIAGNOSIS — E559 Vitamin D deficiency, unspecified: Secondary | ICD-10-CM | POA: Diagnosis not present

## 2020-04-22 DIAGNOSIS — Z6841 Body Mass Index (BMI) 40.0 and over, adult: Secondary | ICD-10-CM

## 2020-04-22 NOTE — Progress Notes (Signed)
Chief Complaint:   Julie Fox is here to discuss her progress with her obesity treatment plan along with follow-up of her obesity related diagnoses. Julie Fox is keeping a food journal and adhering to recommended goals of 1400-1500 calories and 90 grams of protein and states she is following her eating plan approximately 95% of the time. Julie Fox states she is walking 15 minutes 5 times per week.  Today's visit was #: 8 Starting weight: 283 lbs Starting date: 12/05/2019 Today's weight: 287 lbs Today's date: 04/22/2020 Total lbs lost to date: 0 Total lbs lost since last in-office visit: 2  Interim History: Julie Fox is down 2 lbs since her last office visit.  Subjective:   Vitamin D deficiency. Julie Fox is taking high dose Vitamin D supplementation.    Ref. Range 12/05/2019 12:52  Vitamin D, 25-Hydroxy Latest Ref Range: 30.0 - 100.0 ng/mL 13.2 (L)   Elevated liver enzymes. Julie Fox has a new dx of elevated ALT. Her BMI is over 40. She denies abdominal pain or jaundice and has never been told of any liver problems in the past. She denies excessive alcohol intake. No abdominal pain.  Lab Results  Component Value Date   ALT 95 (H) 12/05/2019   AST 67 (H) 12/05/2019   ALKPHOS 130 (H) 12/05/2019   BILITOT 0.4 12/05/2019   Prediabetes. Julie Fox has a diagnosis of prediabetes based on her elevated HgA1c and was informed this puts her at greater risk of developing diabetes. She continues to work on diet and exercise to decrease her risk of diabetes. She denies nausea or hypoglycemia. No polyphagia.  Lab Results  Component Value Date   HGBA1C 6.0 (H) 12/05/2019   Lab Results  Component Value Date   INSULIN 20.8 12/05/2019   Dyslipidemia. Julie Fox is taking Lipitor.  Lab Results  Component Value Date   ALT 95 (H) 12/05/2019   AST 67 (H) 12/05/2019   ALKPHOS 130 (H) 12/05/2019   BILITOT 0.4 12/05/2019   Lab Results  Component Value Date   CHOL 192 12/05/2019    HDL 39 (L) 12/05/2019   LDLCALC 108 (H) 12/05/2019   TRIG 259 (H) 12/05/2019   CHOLHDL 3 06/20/2018   At risk for impaired function of liver. Julie Fox is at risk for impaired function of liver due to likely diagnosis of fatty liver as evidenced by recent elevated liver enzymes.   Assessment/Plan:   Vitamin D deficiency. Low Vitamin D level contributes to fatigue and are associated with obesity, breast, and colon cancer. VITAMIN D 25 Hydroxy (Vit-D Deficiency, Fractures) level was ordered today.  Elevated liver enzymes. We discussed the likely diagnosis of non-alcoholic fatty liver disease today and how this condition is obesity related. Julie Fox was educated the importance of weight loss. Dynasia agreed to continue with her weight loss efforts with healthier diet and exercise as an essential part of her treatment plan. Comprehensive metabolic panel will be checked today.  Prediabetes. Julie Fox will continue to work on weight loss, exercise, and decreasing simple carbohydrates to help decrease the risk of diabetes. Hemoglobin A1c, Insulin, random ordered today.  Dyslipidemia. Julie Fox will continue Lipitor as directed. Lipid Panel With LDL/HDL Ratio ordered today.  At risk for impaired function of liver. Julie Fox was given approximately 15 minutes of counseling today regarding prevention of impaired liver function. Julie Fox was educated about her risk of developing NASH or even liver failure and advised that the only proven treatment for NAFLD was weight loss of at least 5-10% of body weight.  Class 3 severe obesity with serious comorbidity and body mass index (BMI) of 45.0 to 49.9 in adult, unspecified obesity type ([place]).  Julie Fox is currently in the action stage of change. As such, her goal is to continue with weight loss efforts. She has agreed to keeping a food journal and adhering to recommended goals of 1400--1500 calories and 90 grams of protein.   Exercise goals: Julie Fox will continue  walking for exercise.  Behavioral modification strategies: increasing lean protein intake, decreasing simple carbohydrates, increasing vegetables, increasing water intake, decreasing eating out, no skipping meals, meal planning and cooking strategies, keeping healthy foods in the home and planning for success.  Julie Fox has agreed to follow-up with our clinic in 2 weeks. She was informed of the importance of frequent follow-up visits to maximize her success with intensive lifestyle modifications for her multiple health conditions.   Julie Fox was informed we would discuss her lab results at her next visit unless there is a critical issue that needs to be addressed sooner. Julie Fox agreed to keep her next visit at the agreed upon time to discuss these results.  Objective:   Blood pressure 110/76, pulse 83, temperature 98 F (36.7 C), height 5\' 7"  (1.702 m), weight 287 lb (130.2 kg), last menstrual period 12/26/2017, SpO2 96 %. Body mass index is 44.95 kg/m.  General: Cooperative, alert, well developed, in no acute distress. HEENT: Conjunctivae and lids unremarkable. Cardiovascular: Regular rhythm.  Lungs: Normal work of breathing. Neurologic: No focal deficits.   Lab Results  Component Value Date   CREATININE 0.81 12/05/2019   BUN 11 12/05/2019   NA 138 12/05/2019   K 4.4 12/05/2019   CL 100 12/05/2019   CO2 22 12/05/2019   Lab Results  Component Value Date   ALT 95 (H) 12/05/2019   AST 67 (H) 12/05/2019   ALKPHOS 130 (H) 12/05/2019   BILITOT 0.4 12/05/2019   Lab Results  Component Value Date   HGBA1C 6.0 (H) 12/05/2019   HGBA1C 5.8 06/20/2018   HGBA1C 5.7 06/13/2017   Lab Results  Component Value Date   INSULIN 20.8 12/05/2019   Lab Results  Component Value Date   TSH 2.850 12/05/2019   Lab Results  Component Value Date   CHOL 192 12/05/2019   HDL 39 (L) 12/05/2019   LDLCALC 108 (H) 12/05/2019   TRIG 259 (H) 12/05/2019   CHOLHDL 3 06/20/2018   Lab Results    Component Value Date   WBC 8.2 12/05/2019   HGB 14.4 12/05/2019   HCT 41.7 12/05/2019   MCV 95 12/05/2019   PLT 299 12/05/2019   No results found for: IRON, TIBC, FERRITIN  Attestation Statements:   Reviewed by clinician on day of visit: allergies, medications, problem list, medical history, surgical history, family history, social history, and previous encounter notes.  Migdalia Dk, am acting as Location manager for [employer], DO   I have reviewed the above documentation for accuracy and completeness, and I agree with the above. Jearld Lesch, DO

## 2020-04-23 ENCOUNTER — Encounter (INDEPENDENT_AMBULATORY_CARE_PROVIDER_SITE_OTHER): Payer: Self-pay | Admitting: Family Medicine

## 2020-04-23 LAB — COMPREHENSIVE METABOLIC PANEL
ALT: 107 IU/L — ABNORMAL HIGH (ref 0–32)
AST: 66 IU/L — ABNORMAL HIGH (ref 0–40)
Albumin/Globulin Ratio: 1.7 (ref 1.2–2.2)
Albumin: 4.5 g/dL (ref 3.8–4.8)
Alkaline Phosphatase: 114 IU/L (ref 48–121)
BUN/Creatinine Ratio: 11 (ref 9–23)
BUN: 10 mg/dL (ref 6–24)
Bilirubin Total: 0.4 mg/dL (ref 0.0–1.2)
CO2: 25 mmol/L (ref 20–29)
Calcium: 9.8 mg/dL (ref 8.7–10.2)
Chloride: 100 mmol/L (ref 96–106)
Creatinine, Ser: 0.88 mg/dL (ref 0.57–1.00)
GFR calc Af Amer: 94 mL/min/{1.73_m2} (ref >59)
GFR calc non Af Amer: 81 mL/min/{1.73_m2} (ref >59)
Globulin, Total: 2.7 g/dL (ref 1.5–4.5)
Glucose: 87 mg/dL (ref 65–99)
Potassium: 4.6 mmol/L (ref 3.5–5.2)
Sodium: 138 mmol/L (ref 134–144)
Total Protein: 7.2 g/dL (ref 6.0–8.5)

## 2020-04-23 LAB — LIPID PANEL WITH LDL/HDL RATIO
Cholesterol, Total: 165 mg/dL (ref 100–199)
HDL: 40 mg/dL (ref >39)
LDL Chol Calc (NIH): 98 mg/dL (ref 0–99)
LDL/HDL Ratio: 2.5 ratio (ref 0.0–3.2)
Triglycerides: 153 mg/dL — ABNORMAL HIGH (ref 0–149)
VLDL Cholesterol Cal: 27 mg/dL (ref 5–40)

## 2020-04-23 LAB — HEPATIC FUNCTION PANEL: Bilirubin, Direct: 0.11 mg/dL (ref 0.00–0.40)

## 2020-04-23 LAB — HEMOGLOBIN A1C
Est. average glucose Bld gHb Est-mCnc: 123 mg/dL
Hgb A1c MFr Bld: 5.9 % — ABNORMAL HIGH (ref 4.8–5.6)

## 2020-04-23 LAB — VITAMIN D 25 HYDROXY (VIT D DEFICIENCY, FRACTURES): Vit D, 25-Hydroxy: 19.8 ng/mL — ABNORMAL LOW (ref 30.0–100.0)

## 2020-04-23 LAB — INSULIN, RANDOM: INSULIN: 17.2 u[IU]/mL (ref 2.6–24.9)

## 2020-04-29 NOTE — Telephone Encounter (Signed)
Please advise 

## 2020-05-07 ENCOUNTER — Other Ambulatory Visit (INDEPENDENT_AMBULATORY_CARE_PROVIDER_SITE_OTHER): Payer: Self-pay | Admitting: Family Medicine

## 2020-05-07 DIAGNOSIS — E559 Vitamin D deficiency, unspecified: Secondary | ICD-10-CM

## 2020-05-13 ENCOUNTER — Other Ambulatory Visit: Payer: Self-pay

## 2020-05-13 ENCOUNTER — Encounter (INDEPENDENT_AMBULATORY_CARE_PROVIDER_SITE_OTHER): Payer: Self-pay | Admitting: Family Medicine

## 2020-05-13 ENCOUNTER — Ambulatory Visit (INDEPENDENT_AMBULATORY_CARE_PROVIDER_SITE_OTHER): Payer: 59 | Admitting: Family Medicine

## 2020-05-13 VITALS — BP 134/78 | HR 62 | Temp 98.2°F | Ht 67.0 in | Wt 283.0 lb

## 2020-05-13 DIAGNOSIS — R7401 Elevation of levels of liver transaminase levels: Secondary | ICD-10-CM | POA: Diagnosis not present

## 2020-05-13 DIAGNOSIS — Z9189 Other specified personal risk factors, not elsewhere classified: Secondary | ICD-10-CM | POA: Diagnosis not present

## 2020-05-13 DIAGNOSIS — E559 Vitamin D deficiency, unspecified: Secondary | ICD-10-CM | POA: Diagnosis not present

## 2020-05-13 DIAGNOSIS — Z6841 Body Mass Index (BMI) 40.0 and over, adult: Secondary | ICD-10-CM

## 2020-05-13 MED ORDER — VITAMIN D (ERGOCALCIFEROL) 1.25 MG (50000 UNIT) PO CAPS: 50000.0000 [IU] | ORAL_CAPSULE | ORAL | 0 refills | Status: DC

## 2020-05-14 LAB — COMPREHENSIVE METABOLIC PANEL
ALT: 117 IU/L — ABNORMAL HIGH (ref 0–32)
AST: 74 IU/L — ABNORMAL HIGH (ref 0–40)
Albumin/Globulin Ratio: 1.7 (ref 1.2–2.2)
Albumin: 4.7 g/dL (ref 3.8–4.8)
Alkaline Phosphatase: 127 IU/L — ABNORMAL HIGH (ref 48–121)
BUN/Creatinine Ratio: 14 (ref 9–23)
BUN: 14 mg/dL (ref 6–24)
Bilirubin Total: 0.3 mg/dL (ref 0.0–1.2)
CO2: 20 mmol/L (ref 20–29)
Calcium: 10 mg/dL (ref 8.7–10.2)
Chloride: 99 mmol/L (ref 96–106)
Creatinine, Ser: 0.98 mg/dL (ref 0.57–1.00)
GFR calc Af Amer: 82 mL/min/{1.73_m2} (ref >59)
GFR calc non Af Amer: 71 mL/min/{1.73_m2} (ref >59)
Globulin, Total: 2.7 g/dL (ref 1.5–4.5)
Glucose: 122 mg/dL — ABNORMAL HIGH (ref 65–99)
Potassium: 4.4 mmol/L (ref 3.5–5.2)
Sodium: 138 mmol/L (ref 134–144)
Total Protein: 7.4 g/dL (ref 6.0–8.5)

## 2020-05-15 NOTE — Progress Notes (Signed)
Chief Complaint:   OBESITY Julie Fox is here to discuss her progress with her obesity treatment plan along with follow-up of her obesity related diagnoses. Julie Fox is on keeping a food journal and adhering to recommended goals of 1400-1500 calories and 90 grams of protein daily and states she is following her eating plan approximately 95% of the time. Julie Fox states she is on the treadmill for 20-30 minutes 4-5 times per week.  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 She is fairly excited about this. She has gotten into a better routine for food. Breakfast is a protein shake then eggwich a few hours later, lunch is protein and vegetables. Calorie wise she is hitting around 1200 calories.  Subjective:   1. Vitamin D deficiency Julie Fox denies nausea, vomiting, or muscle weakness, but she notes fatigue. She is on prescription Vit D.  2. Transaminitis Julie Fox is on Genvoya, prescribed by [name]. Last LFTs showed AST 66, and ALT 107.  3. At risk for impaired function of liver Julie Fox is at risk for impaired function of liver due to likely diagnosis of fatty liver as evidenced by recent elevated liver enzymes.   Assessment/Plan:   1. Vitamin D deficiency Low Vitamin D level contributes to fatigue and are associated with obesity, breast, and colon cancer. We will refill prescription Vitamin D for 1 month. Julie Fox will follow-up for routine testing of Vitamin D, at least 2-3 times per year to avoid over-replacement.  - Vitamin D, Ergocalciferol, (DRISDOL) 1.25 MG (50000 UNIT) CAPS capsule; Take 1 capsule (50,000 Units total) by mouth every 7 (seven) days.  Dispense: 4 capsule; Refill: 0  2. Transaminitis We will check  labs today. Julie Fox will continue to follow up as directed.  - Comprehensive metabolic panel  3. At risk for impaired function of liver Julie Fox was given approximately 15 minutes of counseling today regarding prevention of impaired liver function. Julie Fox was educated about her risk of developing NASH or even liver failure and advised that the only proven treatment for NAFLD was weight loss of at least 5-10% of body weight.   4. Class 3 severe obesity with serious comorbidity and body mass index (BMI) of 40.0 to 44.9 in adult, unspecified obesity type Julie Fox) Julie Fox is currently in the action stage of change. As such, her goal is to continue with weight loss efforts. She has agreed to keeping a food journal and adhering to recommended goals of 1400-1700 calories and 100+ grams of protein daily.   Exercise goals: All adults should avoid inactivity. Some physical activity is better than none, and adults who participate in any amount of physical activity gain some health benefits.  Behavioral modification strategies: increasing lean protein intake, increasing vegetables, meal planning and cooking strategies, keeping healthy foods in the home and planning for success.  Joory has agreed to follow-up with our clinic in 2 to 3 weeks. She was informed of the importance of frequent follow-up visits to maximize her success with intensive lifestyle modifications for her multiple health conditions.   Nadirah was informed we would discuss her lab results at her next visit unless there is a critical issue that needs to be addressed sooner. Yeilyn agreed to keep her next visit at the agreed  upon time to discuss these results.  Objective:   Blood pressure 134/78, pulse 62, temperature 98.2 F (36.8 C), temperature source Oral, height 5\' 7"  (1.702 m), weight 283 lb (128.4 kg), last menstrual period 12/26/2017, SpO2 100 %. Body mass index is 44.32 kg/m.  General: Cooperative, alert, well developed, in no  acute distress. HEENT: Conjunctivae and lids unremarkable. Cardiovascular: Regular rhythm.  Lungs: Normal work of breathing. Neurologic: No focal deficits.   Lab Results  Component Value Date   CREATININE 0.98 05/13/2020   BUN 14 05/13/2020   NA 138 05/13/2020   K 4.4 05/13/2020   CL 99 05/13/2020   CO2 20 05/13/2020   Lab Results  Component Value Date   ALT 117 (H) 05/13/2020   AST 74 (H) 05/13/2020   ALKPHOS 127 (H) 05/13/2020   BILITOT 0.3 05/13/2020   Lab Results  Component Value Date   HGBA1C 5.9 (H) 04/22/2020   HGBA1C 6.0 (H) 12/05/2019   HGBA1C 5.8 06/20/2018   HGBA1C 5.7 06/13/2017   Lab Results  Component Value Date   INSULIN 17.2 04/22/2020   INSULIN 20.8 12/05/2019   Lab Results  Component Value Date   TSH 2.850 12/05/2019   Lab Results  Component Value Date   CHOL 165 04/22/2020   HDL 40 04/22/2020   LDLCALC 98 04/22/2020   TRIG 153 (H) 04/22/2020   CHOLHDL 3 06/20/2018   Lab Results  Component Value Date   WBC 8.2 12/05/2019   HGB 14.4 12/05/2019   HCT 41.7 12/05/2019   MCV 95 12/05/2019   PLT 299 12/05/2019   No results found for: IRON, TIBC, FERRITIN  Attestation Statements:   Reviewed by clinician on day of visit: allergies, medications, problem list, medical history, surgical history, family history, social history, and previous encounter notes.   I, Trixie Dredge, am acting as transcriptionist for Coralie Common, MD.  I have reviewed the above documentation for accuracy and completeness, and I agree with the above. - Jinny Blossom, MD

## 2020-06-04 ENCOUNTER — Other Ambulatory Visit: Payer: Self-pay

## 2020-06-04 ENCOUNTER — Ambulatory Visit (INDEPENDENT_AMBULATORY_CARE_PROVIDER_SITE_OTHER): Payer: 59 | Admitting: Family Medicine

## 2020-06-04 ENCOUNTER — Encounter (INDEPENDENT_AMBULATORY_CARE_PROVIDER_SITE_OTHER): Payer: Self-pay | Admitting: Family Medicine

## 2020-06-04 VITALS — BP 137/79 | HR 104 | Temp 98.2°F | Ht 67.0 in | Wt 285.0 lb

## 2020-06-04 DIAGNOSIS — Z9189 Other specified personal risk factors, not elsewhere classified: Secondary | ICD-10-CM | POA: Diagnosis not present

## 2020-06-04 DIAGNOSIS — R7401 Elevation of levels of liver transaminase levels: Secondary | ICD-10-CM

## 2020-06-04 DIAGNOSIS — R7303 Prediabetes: Secondary | ICD-10-CM | POA: Diagnosis not present

## 2020-06-04 DIAGNOSIS — Z6841 Body Mass Index (BMI) 40.0 and over, adult: Secondary | ICD-10-CM

## 2020-06-04 MED ORDER — WEGOVY 0.25 MG/0.5ML ~~LOC~~ SOAJ: 0.2500 mg | SUBCUTANEOUS | 0 refills | Status: DC

## 2020-06-05 ENCOUNTER — Encounter (INDEPENDENT_AMBULATORY_CARE_PROVIDER_SITE_OTHER): Payer: Self-pay | Admitting: Family Medicine

## 2020-06-05 NOTE — Telephone Encounter (Signed)
FYI

## 2020-06-05 NOTE — Progress Notes (Signed)
Chief Complaint:   OBESITY Julie Fox is here to discuss her progress with her obesity treatment plan along with follow-up of her obesity related diagnoses. Julie Fox is on keeping a food journal and adhering to recommended goals of 1400-1700 calories and 100+ grams of protein daily and states she is following her eating plan approximately 92-93% of Julie time. Julie Fox states she is walking for 30-45 minutes 5 times per week.  Today's visit was #: 10 Starting weight: 283 lbs Starting date: 12/05/2019 Today's weight: 288 lbs Today's date: 06/04/2020 Total lbs lost to date: 0 Total lbs lost since last in-office visit: 0  Interim History: Julie Fox voices she has been eating 1300-1400 calories per day. She is getting in 100 grams of protein per day. She has no plans for Julie next few weeks except for work. [place]  Subjective:   1. Transaminitis Julie Fox's last LFTs increased from previously. She is on Genvoya from Dr. Graylon Good.  2. Pre-diabetes Julie Fox's last A1c was 5.9 and insulin 17.2. She was previously on metformin.  3. At risk for diabetes mellitus Julie Fox is at higher than average risk for developing diabetes due to her obesity.   Assessment/Plan:   1. Transaminitis Julie Fox is to follow up with Dr. Graylon Good for medication management.  2. Pre-diabetes Julie Fox will continue to work on weight loss, exercise, and decreasing simple carbohydrates to help decrease Julie risk of diabetes. We will repeat labs in 3 months.  3. At risk for diabetes mellitus Julie Fox was given approximately 15 minutes of diabetes education and counseling today. We discussed intensive lifestyle modifications today with an emphasis on weight loss as well as increasing exercise and decreasing simple carbohydrates in her diet. We also reviewed medication options with an emphasis on risk versus benefit of those discussed.   Repetitive spaced learning was employed  today to elicit superior memory formation and behavioral change.  4. Class 3 severe obesity with serious comorbidity and body mass index (BMI) of 40.0 to 44.9 in adult, unspecified obesity type Julie Fox is currently in Julie action stage of change. As such, her goal is to continue with weight loss efforts. She has agreed to keeping a food journal and adhering to recommended goals of 1400-1700 calories and 100+ grams of protein daily.   We discussed various medication options to help Julie Fox with her weight loss efforts and we both agreed to start Wegovy 0.25 mg SubQ weekly with no refills.  - Semaglutide-Weight Management (WEGOVY) 0.25 MG/0.5ML SOAJ; Inject 0.25 mg into Julie skin once a week.  Dispense: 2 mL; Refill: 0  Exercise goals: All adults should avoid inactivity. Some physical activity is better than none, and adults who participate in any amount of physical activity gain some health benefits.  Behavioral modification strategies: increasing lean protein intake, decreasing simple carbohydrates and keeping a strict food journal.  Julie Fox has agreed to follow-up with our clinic in 2 weeks. She was informed of Julie importance of frequent follow-up visits to maximize her success with intensive lifestyle modifications for her multiple health conditions.   Objective:   Blood pressure 137/79, pulse (!) 104, temperature 98.2 F (36.8 C), temperature source Oral, height 5\' 7"  (1.702 m), weight 285 lb (129.3 kg), last menstrual period 12/26/2017, SpO2 100 %. Body mass index is 44.64 kg/m.  General: Cooperative, alert, well developed, in no acute distress. HEENT: Conjunctivae and lids unremarkable. Cardiovascular: Regular rhythm.  Lungs: Normal work of breathing. Neurologic:  No focal deficits.   Lab Results  Component Value Date   CREATININE 0.98 05/13/2020   BUN 14 05/13/2020   NA 138 05/13/2020   K 4.4 05/13/2020   CL 99 05/13/2020   CO2 20 05/13/2020   Lab Results  Component  Value Date   ALT 117 (H) 05/13/2020   AST 74 (H) 05/13/2020   ALKPHOS 127 (H) 05/13/2020   BILITOT 0.3 05/13/2020   Lab Results  Component Value Date   HGBA1C 5.9 (H) 04/22/2020   HGBA1C 6.0 (H) 12/05/2019   HGBA1C 5.8 06/20/2018   HGBA1C 5.7 06/13/2017   Lab Results  Component Value Date   INSULIN 17.2 04/22/2020   INSULIN 20.8 12/05/2019   Lab Results  Component Value Date   TSH 2.850 12/05/2019   Lab Results  Component Value Date   CHOL 165 04/22/2020   HDL 40 04/22/2020   LDLCALC 98 04/22/2020   TRIG 153 (H) 04/22/2020   CHOLHDL 3 06/20/2018   Lab Results  Component Value Date   WBC 8.2 12/05/2019   HGB 14.4 12/05/2019   HCT 41.7 12/05/2019   MCV 95 12/05/2019   PLT 299 12/05/2019   No results found for: IRON, TIBC, FERRITIN  Attestation Statements:   Reviewed by clinician on day of visit: allergies, medications, problem list, medical history, surgical history, family history, social history, and previous encounter notes.   I, Trixie Dredge, am acting as transcriptionist for Coralie Common, MD.  I have reviewed Julie above documentation for accuracy and completeness, and I agree with Julie above. - Jinny Blossom, MD

## 2020-06-26 ENCOUNTER — Ambulatory Visit (INDEPENDENT_AMBULATORY_CARE_PROVIDER_SITE_OTHER): Payer: 59 | Admitting: Family Medicine

## 2020-06-26 ENCOUNTER — Encounter (INDEPENDENT_AMBULATORY_CARE_PROVIDER_SITE_OTHER): Payer: Self-pay | Admitting: Family Medicine

## 2020-06-26 ENCOUNTER — Other Ambulatory Visit: Payer: Self-pay

## 2020-06-26 VITALS — BP 118/74 | HR 70 | Temp 98.3°F | Ht 67.0 in | Wt 290.0 lb

## 2020-06-26 DIAGNOSIS — E7849 Other hyperlipidemia: Secondary | ICD-10-CM | POA: Diagnosis not present

## 2020-06-26 DIAGNOSIS — R7303 Prediabetes: Secondary | ICD-10-CM | POA: Diagnosis not present

## 2020-06-26 DIAGNOSIS — Z6841 Body Mass Index (BMI) 40.0 and over, adult: Secondary | ICD-10-CM

## 2020-07-01 NOTE — Progress Notes (Signed)
Chief Complaint:   OBESITY Julie Fox is here to discuss her progress with her obesity treatment plan along with follow-up of her obesity related diagnoses. Julie Fox is on keeping a food journal and adhering to recommended goals of 1400-1700 calories and 100+ grams of protein daily and states she is following her eating plan approximately 75% of the time. Julie Fox states she is walking for 15 minutes 7 times per week.  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 She has tried to get back on track since the emotional eating. She got Julie Fox and started it 3 days ago.  Subjective:   1. Other hyperlipidemia Julie Fox's last LDL was 98, HDL 40, and triglycerides 153. She is on Lipitor daily.  2. Pre-diabetes Julie Fox's last A1c was 5.9 and insulin 17.2. She is not on metformin secondary to GI side effects.  Assessment/Plan:   1. Other hyperlipidemia Cardiovascular risk and specific lipid/LDL goals reviewed. We discussed several lifestyle modifications today and Julie Fox will continue to work on diet, exercise and weight loss efforts. We will repeat labs in 2 months. Orders and follow up as documented in patient record.   Counseling Intensive lifestyle modifications are the first line treatment for this issue. . Dietary changes: Increase soluble fiber. Decrease simple carbohydrates. . Exercise changes: Moderate to vigorous-intensity aerobic activity 150 minutes per week if tolerated. . Lipid-lowering medications: see documented in medical record.  2. Pre-diabetes Julie Fox will continue to work on weight loss, exercise, and decreasing  simple carbohydrates to help decrease the risk of diabetes. We will repeat labs in 2 months.  3. Class 3 severe obesity with serious comorbidity and body mass index (BMI) of 45.0 to 49.9 in adult, unspecified obesity type Julie Fox) Julie Fox is currently in the action stage of change. As such, her goal is to continue with weight loss efforts. She has agreed to keeping a food journal and adhering to recommended goals of 1400-1700 calories and 100+ grams of protein daily.   Exercise goals: As is.  Behavioral modification strategies: increasing lean protein intake, meal planning and cooking strategies, keeping healthy foods in the home and planning for success.  Julie Fox has agreed to follow-up with our clinic in 2 weeks. She was informed of the importance of frequent follow-up visits to maximize her success with intensive lifestyle modifications for her multiple health conditions.   Objective:   Blood pressure 118/74, pulse 70, temperature 98.3 F (36.8 C), temperature source Oral, height 5\' 7"  (1.702 m), weight 290 lb (131.5 kg), last menstrual period 12/26/2017, SpO2 93 %. Body mass index is 45.42 kg/m.  General: Cooperative, alert, well developed, in no acute distress. HEENT: Conjunctivae and lids unremarkable. Cardiovascular: Regular rhythm.  Lungs: Normal work of breathing. Neurologic: No focal deficits.   Lab Results  Component Value Date   CREATININE 0.98 05/13/2020   BUN 14 05/13/2020   NA 138 05/13/2020   K 4.4 05/13/2020   CL 99 05/13/2020   CO2 20 05/13/2020   Lab Results  Component Value Date   ALT 117 (H) 05/13/2020   AST 74 (H) 05/13/2020   ALKPHOS 127 (H) 05/13/2020   BILITOT 0.3  05/13/2020   Lab Results  Component Value Date   HGBA1C 5.9 (H) 04/22/2020   HGBA1C 6.0 (H) 12/05/2019   HGBA1C 5.8 06/20/2018   HGBA1C 5.7 06/13/2017   Lab Results  Component Value Date   INSULIN 17.2 04/22/2020   INSULIN 20.8 12/05/2019   Lab Results  Component Value Date   TSH  2.850 12/05/2019   Lab Results  Component Value Date   CHOL 165 04/22/2020   HDL 40 04/22/2020   LDLCALC 98 04/22/2020   TRIG 153 (H) 04/22/2020   CHOLHDL 3 06/20/2018   Lab Results  Component Value Date   WBC 8.2 12/05/2019   HGB 14.4 12/05/2019   HCT 41.7 12/05/2019   MCV 95 12/05/2019   PLT 299 12/05/2019   No results found for: IRON, TIBC, FERRITIN  Attestation Statements:   Reviewed by clinician on day of visit: allergies, medications, problem list, medical history, surgical history, family history, social history, and previous encounter notes.  Time spent on visit including pre-visit chart review and post-visit care and charting was 15 minutes.    I, Trixie Dredge, am acting as transcriptionist for Julie Common, MD.  I have reviewed the above documentation for accuracy and completeness, and I agree with the above. - Julie Blossom, MD

## 2020-07-07 ENCOUNTER — Other Ambulatory Visit: Payer: Self-pay | Admitting: Obstetrics & Gynecology

## 2020-07-07 DIAGNOSIS — Z1231 Encounter for screening mammogram for malignant neoplasm of breast: Secondary | ICD-10-CM

## 2020-07-17 ENCOUNTER — Encounter (INDEPENDENT_AMBULATORY_CARE_PROVIDER_SITE_OTHER): Payer: Self-pay | Admitting: Family Medicine

## 2020-07-17 ENCOUNTER — Ambulatory Visit (INDEPENDENT_AMBULATORY_CARE_PROVIDER_SITE_OTHER): Payer: 59 | Admitting: Family Medicine

## 2020-07-17 ENCOUNTER — Other Ambulatory Visit: Payer: Self-pay

## 2020-07-17 VITALS — BP 127/81 | HR 67 | Temp 98.1°F | Ht 67.0 in | Wt 284.0 lb

## 2020-07-17 DIAGNOSIS — R7303 Prediabetes: Secondary | ICD-10-CM

## 2020-07-17 DIAGNOSIS — Z9189 Other specified personal risk factors, not elsewhere classified: Secondary | ICD-10-CM | POA: Diagnosis not present

## 2020-07-17 DIAGNOSIS — E66813 Obesity, class 3: Secondary | ICD-10-CM

## 2020-07-17 DIAGNOSIS — E559 Vitamin D deficiency, unspecified: Secondary | ICD-10-CM | POA: Diagnosis not present

## 2020-07-17 DIAGNOSIS — Z6841 Body Mass Index (BMI) 40.0 and over, adult: Secondary | ICD-10-CM

## 2020-07-17 MED ORDER — WEGOVY 0.25 MG/0.5ML ~~LOC~~ SOAJ: 0.2500 mg | SUBCUTANEOUS | 0 refills | Status: DC

## 2020-07-17 MED ORDER — VITAMIN D (ERGOCALCIFEROL) 1.25 MG (50000 UNIT) PO CAPS: 50000.0000 [IU] | ORAL_CAPSULE | ORAL | 0 refills | Status: DC

## 2020-07-18 ENCOUNTER — Ambulatory Visit
Admission: RE | Admit: 2020-07-18 | Discharge: 2020-07-18 | Disposition: A | Discharge status: Home / Self Care | Payer: 59 | Source: Ambulatory Visit | Attending: Obstetrics & Gynecology | Admitting: Obstetrics & Gynecology

## 2020-07-18 DIAGNOSIS — Z1231 Encounter for screening mammogram for malignant neoplasm of breast: Secondary | ICD-10-CM

## 2020-07-18 NOTE — Progress Notes (Signed)
Chief Complaint:   OBESITY Julie Fox is here to discuss her progress with her obesity treatment plan along with follow-up of her obesity related diagnoses. Julie Fox is on keeping a food journal and adhering to recommended goals of 1400-1700 calories and 100+ grams of protein daily and states she is following her eating plan approximately 85% of the time. Julie Fox states she is walking for 60 minutes 7 times per week.  Today's visit was #: 10 Starting weight: 283 lbs Starting date: 12/05/2019 Today's weight: 284 lbs Today's date: 07/17/2020 Total lbs lost to date: 0 Total lbs lost since last in-office visit: 6  Interim History: Julie Fox has been on vacation quite a bit. She is on Wegovy with no side effects. She has been traveling quite a bit and she has been eating out more. She noticed that she cannot eat as much at times. She has been between 1700-1800 calories but she hasn't actually logged. Her protein is around 85 grams estimated.  Subjective:   1. Vitamin D deficiency Katja denies nausea, vomiting, or muscle weakness, but she notes fatigue. She is on prescription Vit D. Last Vit D level was 19.8 on 04/22/2020.  2. Pre-diabetes Dalma's last A1c was 5.9 and insulin 17.2. She was previously on metformin but had GI side effects.  3. At risk for deficient intake of food The patient is at a higher than average risk of deficient intake of food due to [facility] with decreased appetite and decreased ability to eat significant quantity of food at one time.  Assessment/Plan:   1. Vitamin D deficiency Low Vitamin D level contributes to fatigue and are associated with obesity, breast, and colon cancer. We will refill prescription Vitamin D for 1 month. Julie Fox will follow-up for routine testing of Vitamin D, at least 2-3 times per year to avoid over-replacement.  - Vitamin D, Ergocalciferol, (DRISDOL) 1.25 MG (50000 UNIT) CAPS capsule; Take 1 capsule (50,000 Units total) by mouth every 7  (seven) days.  Dispense: 4 capsule; Refill: 0  2. Pre-diabetes Lisset will continue to work on weight loss, exercise, and decreasing simple carbohydrates to help decrease the risk of diabetes. We will repeat labs in 1 month.  3. At risk for deficient intake of food Julie Fox was given approximately 15 minutes of deficit intake of food prevention counseling today. Julie Fox is at risk for eating too few calories based on current food recall. She was encouraged to focus on meeting caloric and protein goals according to her recommended meal plan.   4. Class 3 severe obesity with serious comorbidity and body mass index (BMI) of 40.0 to 44.9 in adult, unspecified obesity type Julie Fox) Julie Fox is currently in the action stage of change. As such, her goal is to continue with weight loss efforts. She has agreed to keeping a food journal and adhering to recommended goals of 1400-1700 calories and 100+ grams of protein daily.   We discussed various medication options to help Julie Fox with her weight loss efforts and we both agreed to continue [place], and we will refill for 1 month.  - Semaglutide-Weight Management (WEGOVY) 0.25 MG/0.5ML SOAJ; Inject 0.25 mg into the skin once a week.  Dispense: 2 mL; Refill: 0  Exercise goals: As is.  Behavioral modification strategies: increasing lean protein intake, meal planning and cooking strategies, planning for success and keeping a strict food journal.  Julie Fox has agreed to follow-up with our clinic in 2 weeks. She was informed of the importance of frequent follow-up visits to maximize  her success with intensive lifestyle modifications for her multiple health conditions.   Objective:   Blood pressure 127/81, pulse 67, temperature 98.1 F (36.7 C), temperature source Oral, height 5\' 7"  (1.702 m), weight 284 lb (128.8 kg), last menstrual period 12/26/2017, SpO2 96 %. Body mass index is 44.48 kg/m.  General: Cooperative, alert, well developed, in no acute  distress. HEENT: Conjunctivae and lids unremarkable. Cardiovascular: Regular rhythm.  Lungs: Normal work of breathing. Neurologic: No focal deficits.   Lab Results  Component Value Date   CREATININE 0.98 05/13/2020   BUN 14 05/13/2020   NA 138 05/13/2020   K 4.4 05/13/2020   CL 99 05/13/2020   CO2 20 05/13/2020   Lab Results  Component Value Date   ALT 117 (H) 05/13/2020   AST 74 (H) 05/13/2020   ALKPHOS 127 (H) 05/13/2020   BILITOT 0.3 05/13/2020   Lab Results  Component Value Date   HGBA1C 5.9 (H) 04/22/2020   HGBA1C 6.0 (H) 12/05/2019   HGBA1C 5.8 06/20/2018   HGBA1C 5.7 06/13/2017   Lab Results  Component Value Date   INSULIN 17.2 04/22/2020   INSULIN 20.8 12/05/2019   Lab Results  Component Value Date   TSH 2.850 12/05/2019   Lab Results  Component Value Date   CHOL 165 04/22/2020   HDL 40 04/22/2020   LDLCALC 98 04/22/2020   TRIG 153 (H) 04/22/2020   CHOLHDL 3 06/20/2018   Lab Results  Component Value Date   WBC 8.2 12/05/2019   HGB 14.4 12/05/2019   HCT 41.7 12/05/2019   MCV 95 12/05/2019   PLT 299 12/05/2019   No results found for: IRON, TIBC, FERRITIN  Attestation Statements:   Reviewed by clinician on day of visit: allergies, medications, problem list, medical history, surgical history, family history, social history, and previous encounter notes.   I, Trixie Dredge, am acting as transcriptionist for Coralie Common, MD. I have reviewed the above documentation for accuracy and completeness, and I agree with the above. - Jinny Blossom, MD

## 2020-07-23 ENCOUNTER — Ambulatory Visit: Payer: 59 | Admitting: Internal Medicine

## 2020-07-23 ENCOUNTER — Other Ambulatory Visit: Payer: Self-pay

## 2020-07-23 ENCOUNTER — Encounter: Payer: Self-pay | Admitting: Internal Medicine

## 2020-07-23 VITALS — BP 135/89 | HR 87 | Temp 98.7°F | Ht 68.0 in | Wt 288.0 lb

## 2020-07-23 DIAGNOSIS — R7401 Elevation of levels of liver transaminase levels: Secondary | ICD-10-CM

## 2020-07-23 DIAGNOSIS — B20 Human immunodeficiency virus [HIV] disease: Secondary | ICD-10-CM

## 2020-07-23 DIAGNOSIS — Z23 Encounter for immunization: Secondary | ICD-10-CM | POA: Diagnosis not present

## 2020-07-23 DIAGNOSIS — R1011 Right upper quadrant pain: Secondary | ICD-10-CM

## 2020-07-23 NOTE — Progress Notes (Signed)
RFV: follow up for hiv disease  Patient ID: Julie Fox, female   DOB: Jun 20, 1976, 44 y.o.   MRN: [id number]  HPI  44yo F with well controlled hiv disease, cd 4 count of 1553/VL<20 ( august 2020) only 1 missing dose.  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   Taking it once a week. But has issues with pharmacy - newly started roughly 1 month  transaminitis this past summer. And also has some RUQ pain but alos LLQ in last 3 months. occ diarrhea. No fevers. On exam TTP to RUQ and LLQ but no withdrawal. 5-54yr ago had colonoscopy.  Med hx: had hx of severe gerd -had has EGD and colonoscopy for prolapse colon -- august 2019 had colonoscopy and found to have tubular adenoma.  Family hx: Mom has hx of colon polyps  Outpatient Encounter Medications as of 07/23/2020  Medication Sig  . atorvastatin (LIPITOR) 20 MG tablet TAKE 1 TABLET(20 MG) BY MOUTH DAILY  . elvitegravir-cobicistat-emtricitabine-tenofovir (GENVOYA) 150-150-200-10 MG TABS tablet Take 1 tablet by mouth daily with breakfast.  . PARoxetine (PAXIL) 40 MG tablet Take 1 tablet (40 mg total) by mouth daily.  . Semaglutide-Weight Management (WEGOVY) 0.25 MG/0.5ML SOAJ Inject 0.25 mg into the skin once a week.  . Vitamin D, Ergocalciferol, (DRISDOL) 1.25 MG (50000 UNIT) CAPS capsule Take 1 capsule (50,000 Units total) by mouth every 7 (seven) days.   No facility-administered encounter medications on file as of 07/23/2020.     Patient Active Problem List   Diagnosis Date Noted  . Mild single current episode of major depressive disorder ([employer]) 06/20/2018  . Post-operative state 01/03/2018  . Abnormal uterine bleeding (AUB) 12/12/2017  . GAD (generalized anxiety disorder) 06/13/2017  . Hyperlipidemia 06/13/2017  . HIV disease ([place]) 12/23/2016     Health Maintenance Due  Topic Date Due  . INFLUENZA VACCINE  05/25/2020  . PAP SMEAR-Modifier  06/13/2020    Social History   Tobacco Use  . Smoking status:  Current Every Day Smoker    Packs/day: 0.25    Years: 20.00    Pack years: 5.00    Types: Cigarettes    Start date: 12/24/1995  . Smokeless tobacco: Never Used  . Tobacco comment: cutting back. chantix did not work.  Vaping Use  . Vaping Use: Never used  Substance Use Topics  . Alcohol use: No    Comment: once a month  . Drug use: No    Review of Systems abd pain but 12 point ros is negative. Actively attempting to lose weight via meds/diet/exercise  Physical Exam   BP 135/89   Pulse 87   Temp 98.7 F (37.1 C) (Oral)   Ht 5\' 8"  (1.727 m)   Wt 288 lb (130.6 kg)   LMP 12/26/2017   SpO2 97%   BMI 43.79 kg/m   Physical Exam  Constitutional:  oriented to person, place, and time. appears well-developed and well-nourished. No distress.  HENT: [place]/AT, PERRLA, no scleral icterus Mouth/Throat: Oropharynx is clear and moist. No oropharyngeal exudate.  Cardiovascular: Normal rate, regular rhythm and normal heart sounds. Exam reveals no gallop and no friction rub.  No murmur heard.  Pulmonary/Chest: Effort normal and breath sounds normal. No respiratory distress.  has no wheezes.  Neck = supple, no nuchal rigidity Abdominal: Soft. Bowel sounds are normal.  exhibits no distension. TTP to RUQ and RLQ Lymphadenopathy: no cervical adenopathy. No axillary adenopathy Neurological: alert and oriented to person, place, and time.  Skin: Skin is warm and dry. No rash noted. No erythema.  Psychiatric: a normal mood and affect.  behavior is normal.    Lab Results  Component Value Date   CD4TCELL 53 06/04/2019   Lab Results  Component Value Date   CD4TABS 1,553 06/04/2019   CD4TABS 1,910 02/28/2018   CD4TABS 1,210 08/30/2017   Lab Results  Component Value Date   HIV1RNAQUANT <20 NOT DETECTED 06/04/2019   Lab Results  Component Value Date   HEPBSAB POS (A) 09/27/2016   Lab Results  Component Value Date   LABRPR NON-REACTIVE 06/04/2019    CBC Lab Results  Component Value Date    WBC 8.2 12/05/2019   RBC 4.38 12/05/2019   HGB 14.4 12/05/2019   HCT 41.7 12/05/2019   PLT 299 12/05/2019   MCV 95 12/05/2019   MCH 32.9 12/05/2019   MCHC 34.5 12/05/2019   RDW 12.3 12/05/2019   LYMPHSABS 2.8 12/05/2019   MONOABS 0.7 11/06/2017   EOSABS 0.2 12/05/2019    BMET Lab Results  Component Value Date   NA 138 05/13/2020   K 4.4 05/13/2020   CL 99 05/13/2020   CO2 20 05/13/2020   GLUCOSE 122 (H) 05/13/2020   BUN 14 05/13/2020   CREATININE 0.98 05/13/2020   CALCIUM 10.0 05/13/2020   GFRNONAA 71 05/13/2020   GFRAA 82 05/13/2020      Assessment and Plan  hiv disease = will check labs. Interested in [place]  Weight loss = on meds which gives early satiety. With frequent smaller meals  Health maintenance = get 3rd dose of pfizer early November. mammo done this past week, and has pap due next month. Will give flu shot today.  RUQ pain = U/S - pain with eating and non eating. Describes RUQ that radiates to back. Hx of cholecystectomy in 2011. Already established with a [facility] GI doc but has not seen him for roughly 5 yrs. Will order abd ct today. Based on findings to refer back to gi doc at [facility]

## 2020-07-24 LAB — T-HELPER CELL (CD4) - (RCID CLINIC ONLY)
CD4 % Helper T Cell: 54 % (ref 33–65)
CD4 T Cell Abs: 1408 /uL (ref 400–1790)

## 2020-07-25 LAB — LIPID PANEL
Cholesterol: 160 mg/dL (ref <200)
HDL: 42 mg/dL — ABNORMAL LOW (ref >=50)
LDL Cholesterol (Calc): 93 mg/dL (calc)
Non-HDL Cholesterol (Calc): 118 mg/dL (calc) (ref <130)
Total CHOL/HDL Ratio: 3.8 (calc) (ref <5.0)
Triglycerides: 150 mg/dL — ABNORMAL HIGH (ref <150)

## 2020-07-25 LAB — CBC WITH DIFFERENTIAL/PLATELET
Absolute Monocytes: 490 cells/uL (ref 200–950)
Basophils Absolute: 72 cells/uL (ref 0–200)
Basophils Relative: 1 %
Eosinophils Absolute: 202 cells/uL (ref 15–500)
Eosinophils Relative: 2.8 %
HCT: 41.8 % (ref 35.0–45.0)
Hemoglobin: 14.3 g/dL (ref 11.7–15.5)
Lymphs Abs: 2657 cells/uL (ref 850–3900)
MCH: 32.4 pg (ref 27.0–33.0)
MCHC: 34.2 g/dL (ref 32.0–36.0)
MCV: 94.6 fL (ref 80.0–100.0)
MPV: 10.9 fL (ref 7.5–12.5)
Monocytes Relative: 6.8 %
Neutro Abs: 3780 cells/uL (ref 1500–7800)
Neutrophils Relative %: 52.5 %
Platelets: 336 10*3/uL (ref 140–400)
RBC: 4.42 10*6/uL (ref 3.80–5.10)
RDW: 12.1 % (ref 11.0–15.0)
Total Lymphocyte: 36.9 %
WBC: 7.2 10*3/uL (ref 3.8–10.8)

## 2020-07-25 LAB — COMPLETE METABOLIC PANEL WITH GFR
AG Ratio: 1.8 (calc) (ref 1.0–2.5)
ALT: 83 U/L — ABNORMAL HIGH (ref 6–29)
AST: 47 U/L — ABNORMAL HIGH (ref 10–30)
Albumin: 4.5 g/dL (ref 3.6–5.1)
Alkaline phosphatase (APISO): 105 U/L (ref 31–125)
BUN: 11 mg/dL (ref 7–25)
CO2: 28 mmol/L (ref 20–32)
Calcium: 10.1 mg/dL (ref 8.6–10.2)
Chloride: 100 mmol/L (ref 98–110)
Creat: 0.86 mg/dL (ref 0.50–1.10)
GFR, Est African American: 96 mL/min/{1.73_m2} (ref >=60)
GFR, Est Non African American: 83 mL/min/{1.73_m2} (ref >=60)
Globulin: 2.5 g/dL (calc) (ref 1.9–3.7)
Glucose, Bld: 97 mg/dL (ref 65–99)
Potassium: 4.3 mmol/L (ref 3.5–5.3)
Sodium: 135 mmol/L (ref 135–146)
Total Bilirubin: 0.3 mg/dL (ref 0.2–1.2)
Total Protein: 7 g/dL (ref 6.1–8.1)

## 2020-07-25 LAB — HIV-1 RNA QUANT-NO REFLEX-BLD
HIV 1 RNA Quant: <20 Copies/mL
HIV-1 RNA Quant, Log: <1.3 Log cps/mL

## 2020-07-25 LAB — RPR: RPR Ser Ql: NONREACTIVE

## 2020-07-25 LAB — HEPATITIS B SURFACE ANTIGEN: Hepatitis B Surface Ag: NONREACTIVE

## 2020-07-25 LAB — HEPATITIS C ANTIBODY
Hepatitis C Ab: NONREACTIVE
SIGNAL TO CUT-OFF: 0.01 (ref <1.00)

## 2020-07-31 ENCOUNTER — Other Ambulatory Visit: Payer: Self-pay

## 2020-07-31 ENCOUNTER — Encounter (INDEPENDENT_AMBULATORY_CARE_PROVIDER_SITE_OTHER): Payer: Self-pay | Admitting: Family Medicine

## 2020-07-31 ENCOUNTER — Ambulatory Visit (INDEPENDENT_AMBULATORY_CARE_PROVIDER_SITE_OTHER): Payer: 59 | Admitting: Family Medicine

## 2020-07-31 VITALS — BP 129/80 | HR 68 | Temp 98.1°F | Ht 68.0 in | Wt 281.0 lb

## 2020-07-31 DIAGNOSIS — Z6841 Body Mass Index (BMI) 40.0 and over, adult: Secondary | ICD-10-CM

## 2020-07-31 DIAGNOSIS — Z9189 Other specified personal risk factors, not elsewhere classified: Secondary | ICD-10-CM | POA: Diagnosis not present

## 2020-07-31 DIAGNOSIS — R7401 Elevation of levels of liver transaminase levels: Secondary | ICD-10-CM

## 2020-07-31 DIAGNOSIS — E559 Vitamin D deficiency, unspecified: Secondary | ICD-10-CM | POA: Diagnosis not present

## 2020-07-31 DIAGNOSIS — E66813 Obesity, class 3: Secondary | ICD-10-CM

## 2020-07-31 MED ORDER — VITAMIN D (ERGOCALCIFEROL) 1.25 MG (50000 UNIT) PO CAPS: 50000.0000 [IU] | ORAL_CAPSULE | ORAL | 0 refills | Status: DC

## 2020-07-31 MED ORDER — WEGOVY 0.25 MG/0.5ML ~~LOC~~ SOAJ: 0.2500 mg | SUBCUTANEOUS | 0 refills | Status: DC

## 2020-07-31 NOTE — Progress Notes (Signed)
Chief Complaint:   OBESITY Julie Fox is here to discuss her progress with her obesity treatment plan along with follow-up of her obesity related diagnoses. Julie Fox is on keeping a food journal and adhering to recommended goals of 1400-1700 calories and 100+ grams of protein daily and states she is following her eating plan approximately 0% of the time. Julie Fox states she is doing 0 minutes 0 times per week.  Today's visit was #: 11 Starting weight: 283 lbs Starting date: 12/05/2019 Today's weight: 281 lbs Today's date: 07/31/2020 Total lbs lost to date: 2 Total lbs lost since last in-office visit: 3  Interim History: Julie Fox has not journaled at all secondary to significant familial stress involving her mom who is currently hospitalized. She started [facility] and noticed she is eating more often but less food at each sitting.  Subjective:   1. Vitamin D deficiency Shanyla denies nausea, vomiting, or muscle weakness, but notes fatigue. She is on prescription Vit D. Last vit D level was 19.8.  2. Transaminitis [place] saw [name] and recent transaminases has improved from previous. Her CT abdominal and pelvis is pending.  3. At risk for impaired function of liver Julie Fox is at risk for impaired function of liver due to likely diagnosis of fatty liver as evidenced by recent elevated liver enzymes.   Assessment/Plan:   1. Vitamin D deficiency Low Vitamin D level contributes to fatigue and are associated with obesity, breast, and colon cancer. We will refill prescription Vitamin D for 1 month. Itzae will follow-up for routine testing of Vitamin D, at least 2-3 times per year to avoid over-replacement.  - Vitamin D, Ergocalciferol, (DRISDOL) 1.25 MG (50000 UNIT) CAPS capsule; Take 1 capsule (50,000 Units total) by mouth every 7 (seven) days.  Dispense: 4 capsule; Refill: 0  2. Transaminitis [place] will follow up after CT with Sr. Baxter Flattery for results.  3. At risk for impaired  function of liver Julie Fox was given approximately 15 minutes of counseling today regarding prevention of impaired liver function. Julie Fox was educated about her risk of developing NASH or even liver failure and advised that the only proven treatment for NAFLD was weight loss of at least 5-10% of body weight.   4. Class 3 severe obesity with serious comorbidity and body mass index (BMI) of 40.0 to 44.9 in adult, unspecified obesity type [facility]) Kathrene is currently in the action stage of change. As such, her goal is to continue with weight loss efforts. She has agreed to keeping a food journal and adhering to recommended goals of 1400-1700 calories and 100+ grams of protein daily.   We discussed various medication options to help Julie Fox with her weight loss efforts and we both agreed to [facility], and we will refill for 1 month.  - Semaglutide-Weight Management (WEGOVY) 0.25 MG/0.5ML SOAJ; Inject 0.25 mg into the skin once a week.  Dispense: 2 mL; Refill: 0  Exercise goals: No exercise has been prescribed at this time.  Behavioral modification strategies: increasing lean protein intake, increasing vegetables, meal planning and cooking strategies, keeping healthy foods in the home and planning for success.  Julie Fox has agreed to follow-up with our clinic in 2 weeks. She was informed of the importance of frequent follow-up visits to maximize her success with intensive lifestyle modifications for her multiple health conditions.   Objective:   Blood pressure 129/80, pulse 68, temperature 98.1 F (36.7 C), temperature source Oral, height 5\' 8"  (1.727 m), weight 281 lb (127.5 kg), last menstrual period  12/26/2017, SpO2 100 %. Body mass index is 42.73 kg/m.  General: Cooperative, alert, well developed, in no acute distress. HEENT: Conjunctivae and lids unremarkable. Cardiovascular: Regular rhythm.  Lungs: Normal work of breathing. Neurologic: No focal deficits.   Lab Results  Component Value Date     CREATININE 0.86 07/23/2020   BUN 11 07/23/2020   NA 135 07/23/2020   K 4.3 07/23/2020   CL 100 07/23/2020   CO2 28 07/23/2020   Lab Results  Component Value Date   ALT 83 (H) 07/23/2020   AST 47 (H) 07/23/2020   ALKPHOS 127 (H) 05/13/2020   BILITOT 0.3 07/23/2020   Lab Results  Component Value Date   HGBA1C 5.9 (H) 04/22/2020   HGBA1C 6.0 (H) 12/05/2019   HGBA1C 5.8 06/20/2018   HGBA1C 5.7 06/13/2017   Lab Results  Component Value Date   INSULIN 17.2 04/22/2020   INSULIN 20.8 12/05/2019   Lab Results  Component Value Date   TSH 2.850 12/05/2019   Lab Results  Component Value Date   CHOL 160 07/23/2020   HDL 42 (L) 07/23/2020   LDLCALC 93 07/23/2020   TRIG 150 (H) 07/23/2020   CHOLHDL 3.8 07/23/2020   Lab Results  Component Value Date   WBC 7.2 07/23/2020   HGB 14.3 07/23/2020   HCT 41.8 07/23/2020   MCV 94.6 07/23/2020   PLT 336 07/23/2020   No results found for: IRON, TIBC, FERRITIN  Attestation Statements:   Reviewed by clinician on day of visit: allergies, medications, problem list, medical history, surgical history, family history, social history, and previous encounter notes.   I, Trixie Dredge, am acting as transcriptionist for Coralie Common, MD.  I have reviewed the above documentation for accuracy and completeness, and I agree with the above. - Jinny Blossom, MD

## 2020-08-06 ENCOUNTER — Other Ambulatory Visit: Payer: Self-pay

## 2020-08-06 ENCOUNTER — Ambulatory Visit
Admission: RE | Admit: 2020-08-06 | Discharge: 2020-08-06 | Disposition: A | Discharge status: Home / Self Care | Payer: 59 | Source: Ambulatory Visit | Attending: Internal Medicine | Admitting: Internal Medicine

## 2020-08-06 DIAGNOSIS — R7401 Elevation of levels of liver transaminase levels: Secondary | ICD-10-CM

## 2020-08-06 DIAGNOSIS — R1011 Right upper quadrant pain: Secondary | ICD-10-CM

## 2020-08-06 MED ORDER — IOPAMIDOL (ISOVUE-300) INJECTION 61%
100.0000 mL | Freq: Once | INTRAVENOUS | Status: AC | PRN
  Administered 2020-08-06: 100 mL via INTRAVENOUS

## 2020-08-14 ENCOUNTER — Other Ambulatory Visit: Payer: Self-pay

## 2020-08-14 ENCOUNTER — Ambulatory Visit (INDEPENDENT_AMBULATORY_CARE_PROVIDER_SITE_OTHER): Payer: 59 | Admitting: Adult Health

## 2020-08-14 ENCOUNTER — Encounter (INDEPENDENT_AMBULATORY_CARE_PROVIDER_SITE_OTHER): Payer: Self-pay | Admitting: Adult Health

## 2020-08-14 VITALS — BP 133/84 | HR 75 | Temp 98.5°F | Ht 68.0 in | Wt 281.0 lb

## 2020-08-14 DIAGNOSIS — R7401 Elevation of levels of liver transaminase levels: Secondary | ICD-10-CM

## 2020-08-14 DIAGNOSIS — E559 Vitamin D deficiency, unspecified: Secondary | ICD-10-CM | POA: Diagnosis not present

## 2020-08-14 DIAGNOSIS — Z9189 Other specified personal risk factors, not elsewhere classified: Secondary | ICD-10-CM

## 2020-08-14 DIAGNOSIS — Z6841 Body Mass Index (BMI) 40.0 and over, adult: Secondary | ICD-10-CM

## 2020-08-14 MED ORDER — WEGOVY 0.25 MG/0.5ML ~~LOC~~ SOAJ: 0.2500 mg | SUBCUTANEOUS | 0 refills | Status: DC

## 2020-08-15 ENCOUNTER — Encounter (INDEPENDENT_AMBULATORY_CARE_PROVIDER_SITE_OTHER): Payer: Self-pay | Admitting: Adult Health

## 2020-08-18 DIAGNOSIS — E559 Vitamin D deficiency, unspecified: Secondary | ICD-10-CM | POA: Insufficient documentation

## 2020-08-18 DIAGNOSIS — R7401 Elevation of levels of liver transaminase levels: Secondary | ICD-10-CM | POA: Insufficient documentation

## 2020-08-18 NOTE — Progress Notes (Signed)
Chief Complaint:   Julie Fox is here to discuss her progress with her obesity treatment plan along with follow-up of her obesity related diagnoses. Julie Fox is keeping a food journal and adhering to recommended goals of 1400-1700 calories and 100+ grams of protein and states she is following her eating plan approximately 80% of the time. Julie Fox states she is exercising 0 minutes 0 times per week.  Today's visit was #: 14 Starting weight: 283 lbs Starting date: 12/05/2019 Today's weight: 281 lbs Today's date: 08/14/2020 Total lbs lost to date: 2 Total lbs lost since last in-office visit: 0  Interim History: Brenly's mother fell 4 weeks ago and suffered a brain bleed and orbital fracture. She is caring for her mother at home, which can make it difficult to meal plan/prep and track intake. She feels that she is overeating calories and undereating protein daily; reports tracking 60% of the time. She denies mass in neck, dysphagia, dyspepsia, or history of pancreatitis. Right upper quadrant abdominal pain started 4 months ago-June. She was started on Wegovy in early September.  Subjective:   Transaminitis. Julie Fox has been on atorvastatin 20 mg since 2018. She denies ETOH or acetaminophen use. Right upper quadrant pain is intermittent and began 4 months ago-June.  Vitamin D deficiency. Vitamin D level on 04/22/2020 was 19.8, well below goal of 50. Julie Fox is on Ergocalciferol. No nausea, vomiting, or muscle weakness.    Ref. Range 04/22/2020 14:54  Vitamin D, 25-Hydroxy Latest Ref Range: 30.0 - 100.0 ng/mL 19.8 (L)   At risk for impaired function of liver. Julie Fox is at risk for impaired function of liver due to transaminitis.   Assessment/Plan:   Transaminitis. Julie Fox will follow-up with [name] in regards to CT of the abdomen and pelvis. She will continue to avoid acetaminophen/ETOH use.  Vitamin D deficiency. Low Vitamin D level contributes to fatigue and  are associated with obesity, breast, and colon cancer. She agrees to continue to take Ergocalciferol as directed (no medication refill today) and will follow-up for routine testing of Vitamin D, at least 2-3 times per year to avoid over-replacement.  At risk for impaired function of liver. Julie Fox was given approximately 15 minutes of counseling today regarding prevention of impaired liver function. Julie Fox was educated about her risk of developing NASH or even liver failure and advised that the only proven treatment for NAFLD was weight loss of at least 5-10% of body weight.   Class 3 severe obesity with serious comorbidity and body mass index (BMI) of 40.0 to 44.9 in adult, unspecified obesity type ([place]). Refill was given for Semaglutide-Weight Management (WEGOVY) 0.25 MG/0.5ML SOAJ 0.25 mg, dispense 2 mL with 0 refills.  Julie Fox is currently in the action stage of change. As such, her goal is to continue with weight loss efforts. She has agreed to keeping a food journal and adhering to recommended goals of 1400-1700 calories and 100 grams of protein daily.   Exercise goals: No exercise has been prescribed at this time.  Behavioral modification strategies: increasing lean protein intake, meal planning and cooking strategies, planning for success and keeping a strict food journal.  Julie Fox has agreed to follow-up with our clinic in 2-3 weeks. She was informed of the importance of frequent follow-up visits to maximize her success with intensive lifestyle modifications for her multiple health conditions.   Objective:   Blood pressure 133/84, pulse 75, temperature 98.5 F (36.9 C), height 5\' 8"  (1.727 m), weight 281 lb (127.5 kg),  last menstrual period 12/26/2017, SpO2 97 %. Body mass index is 42.73 kg/m.  General: Cooperative, alert, well developed, in no acute distress. HEENT: Conjunctivae and lids unremarkable. Cardiovascular: Regular rhythm.  Lungs: Normal work of breathing. Neurologic: No  focal deficits.   Lab Results  Component Value Date   CREATININE 0.86 07/23/2020   BUN 11 07/23/2020   NA 135 07/23/2020   K 4.3 07/23/2020   CL 100 07/23/2020   CO2 28 07/23/2020   Lab Results  Component Value Date   ALT 83 (H) 07/23/2020   AST 47 (H) 07/23/2020   ALKPHOS 127 (H) 05/13/2020   BILITOT 0.3 07/23/2020   Lab Results  Component Value Date   HGBA1C 5.9 (H) 04/22/2020   HGBA1C 6.0 (H) 12/05/2019   HGBA1C 5.8 06/20/2018   HGBA1C 5.7 06/13/2017   Lab Results  Component Value Date   INSULIN 17.2 04/22/2020   INSULIN 20.8 12/05/2019   Lab Results  Component Value Date   TSH 2.850 12/05/2019   Lab Results  Component Value Date   CHOL 160 07/23/2020   HDL 42 (L) 07/23/2020   LDLCALC 93 07/23/2020   TRIG 150 (H) 07/23/2020   CHOLHDL 3.8 07/23/2020   Lab Results  Component Value Date   WBC 7.2 07/23/2020   HGB 14.3 07/23/2020   HCT 41.8 07/23/2020   MCV 94.6 07/23/2020   PLT 336 07/23/2020   No results found for: IRON, TIBC, FERRITIN  Attestation Statements:   Reviewed by clinician on day of visit: allergies, medications, problem list, medical history, surgical history, family history, social history, and previous encounter notes.  I, Michaelene Song, am acting as Location manager for [employer], NP-C   I have reviewed the above documentation for accuracy and completeness, and I agree with the above. -  Kabrina Christiano d. Rajveer Handler, NP-C

## 2020-08-18 NOTE — Telephone Encounter (Signed)
Please advise 

## 2020-08-23 ENCOUNTER — Ambulatory Visit: Payer: 59 | Attending: Internal Medicine

## 2020-08-23 DIAGNOSIS — Z23 Encounter for immunization: Secondary | ICD-10-CM

## 2020-08-23 NOTE — Progress Notes (Signed)
   Covid-19 Vaccination Clinic  Name:  Julie Fox    MRN: [id number] DOB: 1975-10-29  08/23/2020  [name] was observed post Covid-19 immunization for 15 minutes without incident. She was provided with Vaccine Information Sheet and instruction to access the V-Safe system.   [name] was instructed to call 911 with any severe reactions post vaccine: Marland Kitchen Difficulty breathing  . Swelling of face and throat  . A fast heartbeat  . A bad rash all over body  . Dizziness and weakness

## 2020-08-27 ENCOUNTER — Ambulatory Visit (INDEPENDENT_AMBULATORY_CARE_PROVIDER_SITE_OTHER): Payer: 59 | Admitting: Obstetrics & Gynecology

## 2020-08-27 ENCOUNTER — Encounter: Payer: Self-pay | Admitting: Obstetrics & Gynecology

## 2020-08-27 ENCOUNTER — Other Ambulatory Visit: Payer: Self-pay

## 2020-08-27 VITALS — BP 122/74 | HR 98 | Ht 68.0 in | Wt 286.0 lb

## 2020-08-27 DIAGNOSIS — Z01419 Encounter for gynecological examination (general) (routine) without abnormal findings: Secondary | ICD-10-CM

## 2020-08-27 DIAGNOSIS — N9489 Other specified conditions associated with female genital organs and menstrual cycle: Secondary | ICD-10-CM

## 2020-08-27 NOTE — Progress Notes (Signed)
Last mammogram- 07/18/20- normal No concerns today

## 2020-08-27 NOTE — Progress Notes (Signed)
Subjective:     MIRIANA GAERTNER is a 44 y.o. female here for a routine exam.  Current complaints: pt reprots pain over liver. This has been evaluated by her primary care and ID. She is scheduled to see GI. She has a CT and was noted to have a cyst in her adnexa. She is s/p hyst 3+ years prev. She denies pelvic pain or discomfort.     Gynecologic History Patient's last menstrual period was 12/26/2017. Contraception: status post hysterectomy Last Pap: 2018.  Last mammogram: 07/18/2020. Results were: normal  Obstetric History OB History  Gravida Para Term Preterm AB Living  0 0 0 0 0 0  SAB TAB Ectopic Multiple Live Births  0 0 0 0 0     The following portions of the patient's history were reviewed and updated as appropriate: allergies, current medications, past family history, past medical history, past social history, past surgical history and problem list.  Review of Systems Pertinent items are noted in HPI.    Objective:  BP 122/74    Pulse 98    Ht 5\' 8"  (1.727 m)    Wt 286 lb (129.7 kg)    LMP 12/26/2017    BMI 43.49 kg/m  General Appearance:    Alert, cooperative, no distress, appears stated age  Head:    Normocephalic, without obvious abnormality, atraumatic  Eyes:    conjunctiva/corneas clear, EOM's intact, both eyes  Ears:    Normal external ear canals, both ears  Nose:   Nares normal, septum midline, mucosa normal, no drainage    or sinus tenderness  Throat:   Lips, mucosa, and tongue normal; teeth and gums normal  Neck:   Supple, symmetrical, trachea midline, no adenopathy;    thyroid:  no enlargement/tenderness/nodules  Back:     Symmetric, no curvature, ROM normal, no CVA tenderness  Lungs:     respirations unlabored  Chest Wall:    No tenderness or deformity   Heart:    Regular rate and rhythm  Breast Exam:    No tenderness, masses, or nipple abnormality; nipple piercing's bilaterally.   Abdomen:     Soft, non-tender, bowel sounds active all four quadrants,     no masses, no organomegaly  Genitalia:    Normal female without lesion, discharge or tenderness   No palpable masses. No tenderness.   Extremities:   Extremities normal, atraumatic, no cyanosis or edema  Pulses:   2+ and symmetric all extremities  Skin:   Skin color, texture, turgor normal, no rashes or lesions   08/06/2020 CLINICAL DATA:  Right upper quadrant pain for 3 months. Elevated liver function tests. Hysterectomy.  EXAM: CT ABDOMEN AND PELVIS WITH CONTRAST  TECHNIQUE: Multidetector CT imaging of the abdomen and pelvis was performed using the standard protocol following bolus administration of intravenous contrast.  CONTRAST:  112mL ISOVUE-300 IOPAMIDOL (ISOVUE-300) INJECTION 61%  COMPARISON:  04/29/2010 abdominal ultrasound. Prior CT of 08/09/2005  FINDINGS: Lower chest: 2 mm left lower lobe pulmonary nodule on 08/05 is similar on the 2006 exam, benign. Normal heart size without pericardial or pleural effusion.  Hepatobiliary: Hepatomegaly is mild at 18.4 cm craniocaudal. Cholecystectomy, without biliary ductal dilatation.  Pancreas: Normal, without mass or ductal dilatation.  Spleen: Normal in size, without focal abnormality.  Adrenals/Urinary Tract: Normal adrenal glands. Normal kidneys, without hydronephrosis. Normal urinary bladder.  Stomach/Bowel: Normal stomach, without wall thickening. Normal colon, appendix, and terminal ileum. Normal small bowel.  Vascular/Lymphatic: Normal caliber of the aorta and branch  vessels. Circumaortic left renal vein. No abdominal adenopathy. Borderline enlarged right external iliac node at 10 mm on 91/2 is similar back in 2006, considered benign.  Reproductive: Hysterectomy. Lobulated fluid density lesion including at 6.1 x 5.3 cm is favored to arise from the left ovary including on 87/2. Probable septation within.  Other: No significant free fluid.  No free intraperitoneal air.  Musculoskeletal: Bilateral  nipple piercings. No acute osseous abnormality.  IMPRESSION: 1.  No acute process or explanation for right upper quadrant pain. 2. Macrolobulated 6.1 cm cystic lesion, favored to arise from the left ovary. Recommend further evaluation with pre and post contrast pelvic MRI (preferred) versus ultrasound. This recommendation follows ACR consensus guidelines: White Paper of the ACR Incidental Findings Committee II on Adnexal Findings. J Am Coll Radiol [phone]. 3. Hepatomegaly. Assessment:    Healthy female exam.   Adnexal mass on CT- suspect simple cyst. Will get [place] to confirm    Plan:   TV US F/u post resutls of [place] F/u in 1 year for annual Annual mammogram   Nate Common L. Harraway-Smith, M.D., Cherlynn June

## 2020-09-02 ENCOUNTER — Ambulatory Visit (HOSPITAL_BASED_OUTPATIENT_CLINIC_OR_DEPARTMENT_OTHER)
Admission: RE | Admit: 2020-09-02 | Discharge: 2020-09-02 | Disposition: A | Discharge status: Home / Self Care | Payer: 59 | Source: Ambulatory Visit | Attending: Obstetrics & Gynecology | Admitting: Obstetrics & Gynecology

## 2020-09-02 ENCOUNTER — Ambulatory Visit (INDEPENDENT_AMBULATORY_CARE_PROVIDER_SITE_OTHER): Payer: 59 | Admitting: Family Medicine

## 2020-09-02 ENCOUNTER — Other Ambulatory Visit: Payer: Self-pay

## 2020-09-02 ENCOUNTER — Encounter (INDEPENDENT_AMBULATORY_CARE_PROVIDER_SITE_OTHER): Payer: Self-pay | Admitting: Family Medicine

## 2020-09-02 VITALS — BP 122/80 | HR 102 | Temp 98.6°F | Ht 68.0 in | Wt 283.0 lb

## 2020-09-02 DIAGNOSIS — Z6841 Body Mass Index (BMI) 40.0 and over, adult: Secondary | ICD-10-CM | POA: Diagnosis not present

## 2020-09-02 DIAGNOSIS — R7401 Elevation of levels of liver transaminase levels: Secondary | ICD-10-CM

## 2020-09-02 DIAGNOSIS — N9489 Other specified conditions associated with female genital organs and menstrual cycle: Secondary | ICD-10-CM | POA: Diagnosis not present

## 2020-09-02 DIAGNOSIS — E559 Vitamin D deficiency, unspecified: Secondary | ICD-10-CM | POA: Diagnosis not present

## 2020-09-02 MED ORDER — WEGOVY 0.5 MG/0.5ML ~~LOC~~ SOAJ: 0.5000 mg | SUBCUTANEOUS | 0 refills | Status: DC

## 2020-09-03 ENCOUNTER — Telehealth: Payer: Self-pay

## 2020-09-03 ENCOUNTER — Other Ambulatory Visit: Payer: Self-pay | Admitting: Obstetrics & Gynecology

## 2020-09-03 DIAGNOSIS — N9489 Other specified conditions associated with female genital organs and menstrual cycle: Secondary | ICD-10-CM

## 2020-09-03 NOTE — Progress Notes (Signed)
Chief Complaint:   OBESITY Julie Fox is here to discuss her progress with her obesity treatment plan along with follow-up of her obesity related diagnoses. Alicia is on keeping a food journal and adhering to recommended goals of 1400-1700 calories and 100 grams of protein daily and states she is following her eating plan approximately 80% of the time. Julie Fox states she is doing 0 minutes 0 times per week.  Today's visit was #: 15 Starting weight: 283 lbs Starting date: 12/05/2019 Today's weight: 283 lbs Today's date: 09/02/2020 Total lbs lost to date: 0 Total lbs lost since last in-office visit: 0  Interim History: Julie Fox has been journaling pretty consistently, ending up around 1200-1300 calories and getting 90-95 grams of protein daily. She has been taking care of her mom and working. She has no plans for Thanksgiving. She is only eating between 1200-1300 calories, as she has only wanted to work and sleep.  Subjective:   1. Transaminitis Batsheva has an appointment with GI in the upcoming week (1 week). She is on statin and genvoya.  2. Vitamin D deficiency Julie Fox denies nausea, vomiting, muscle weakness, but notes fatigue. She is on prescription Vit D, and last Vit D level was 19.8.  Assessment/Plan:   1. Transaminitis Athleen will follow with GI, no change in plan.  2. Vitamin D deficiency Low Vitamin D level contributes to fatigue and are associated with obesity, breast, and colon cancer. Julie Fox agreed to continue taking prescription Vitamin D 50,000 IU every week and will follow-up for routine testing of Vitamin D, at least 2-3 times per year to avoid over-replacement.  3. Class 3 severe obesity with serious comorbidity and body mass index (BMI) of 40.0 to 44.9 in adult, unspecified obesity type [facility]) Julie Fox is currently in the action stage of change. As such, her goal is to continue with weight loss efforts. She has agreed to keeping a food journal and adhering to  recommended goals of 1400-1700 calories and 95+ grams of protein daily.   We discussed various medication options to help Julie Fox with her weight loss efforts and we both agreed to increase Wegovy to 0.50 mg SubQ weekly with no refills.  - Semaglutide-Weight Management (WEGOVY) 0.5 MG/0.5ML SOAJ; Inject 0.5 mg into the skin once a week.  Dispense: 2 mL; Refill: 0  Exercise goals: All adults should avoid inactivity. Some physical activity is better than none, and adults who participate in any amount of physical activity gain some health benefits.  Behavioral modification strategies: increasing lean protein intake, meal planning and cooking strategies, keeping healthy foods in the home and planning for success.  Julie Fox has agreed to follow-up with our clinic in 3 weeks. She was informed of the importance of frequent follow-up visits to maximize her success with intensive lifestyle modifications for her multiple health conditions.   Objective:   Blood pressure 122/80, pulse (!) 102, temperature 98.6 F (37 C), height 5\' 8"  (1.727 m), weight 283 lb (128.4 kg), last menstrual period 12/26/2017, SpO2 97 %. Body mass index is 43.03 kg/m.  General: Cooperative, alert, well developed, in no acute distress. HEENT: Conjunctivae and lids unremarkable. Cardiovascular: Regular rhythm.  Lungs: Normal work of breathing. Neurologic: No focal deficits.   Lab Results  Component Value Date   CREATININE 0.86 07/23/2020   BUN 11 07/23/2020   NA 135 07/23/2020   K 4.3 07/23/2020   CL 100 07/23/2020   CO2 28 07/23/2020   Lab Results  Component Value Date   ALT  83 (H) 07/23/2020   AST 47 (H) 07/23/2020   ALKPHOS 127 (H) 05/13/2020   BILITOT 0.3 07/23/2020   Lab Results  Component Value Date   HGBA1C 5.9 (H) 04/22/2020   HGBA1C 6.0 (H) 12/05/2019   HGBA1C 5.8 06/20/2018   HGBA1C 5.7 06/13/2017   Lab Results  Component Value Date   INSULIN 17.2 04/22/2020   INSULIN 20.8 12/05/2019   Lab  Results  Component Value Date   TSH 2.850 12/05/2019   Lab Results  Component Value Date   CHOL 160 07/23/2020   HDL 42 (L) 07/23/2020   LDLCALC 93 07/23/2020   TRIG 150 (H) 07/23/2020   CHOLHDL 3.8 07/23/2020   Lab Results  Component Value Date   WBC 7.2 07/23/2020   HGB 14.3 07/23/2020   HCT 41.8 07/23/2020   MCV 94.6 07/23/2020   PLT 336 07/23/2020   No results found for: IRON, TIBC, FERRITIN   Attestation Statements:   Reviewed by clinician on day of visit: allergies, medications, problem list, medical history, surgical history, family history, social history, and previous encounter notes.  Time spent on visit including pre-visit chart review and post-visit care and charting was 16 minutes.    I, Trixie Dredge, am acting as transcriptionist for Coralie Common, MD.  I have reviewed the above documentation for accuracy and completeness, and I agree with the above. - Jinny Blossom, MD

## 2020-09-03 NOTE — Telephone Encounter (Signed)
Radiology called to give call report for US done on 09/02/20. A message will be sent to the provider.  Preslei Blakley l Jamorion Gomillion, CMA

## 2020-09-04 ENCOUNTER — Telehealth: Payer: Self-pay

## 2020-09-04 DIAGNOSIS — N9489 Other specified conditions associated with female genital organs and menstrual cycle: Secondary | ICD-10-CM

## 2020-09-04 NOTE — Telephone Encounter (Signed)
-----   Message from Lavonia Drafts, MD sent at 09/03/2020  4:40 PM EST ----- Pt needs CT of abd pelvis.   Needs labs prior to the test: BMP and Ova 1 tests.   Thank you,   Clh-S

## 2020-09-04 NOTE — Telephone Encounter (Signed)
Patient called and will come today  At 2pm for labwork.  CT ordered and will schedule according to labwork results. Kathrene Alu RN

## 2020-09-09 ENCOUNTER — Other Ambulatory Visit (INDEPENDENT_AMBULATORY_CARE_PROVIDER_SITE_OTHER): Payer: Self-pay | Admitting: Family Medicine

## 2020-09-09 ENCOUNTER — Ambulatory Visit (HOSPITAL_BASED_OUTPATIENT_CLINIC_OR_DEPARTMENT_OTHER)
Admission: RE | Admit: 2020-09-09 | Discharge: 2020-09-09 | Disposition: A | Discharge status: Home / Self Care | Payer: 59 | Source: Ambulatory Visit | Attending: Obstetrics & Gynecology | Admitting: Obstetrics & Gynecology

## 2020-09-09 ENCOUNTER — Other Ambulatory Visit: Payer: Self-pay

## 2020-09-09 DIAGNOSIS — N9489 Other specified conditions associated with female genital organs and menstrual cycle: Secondary | ICD-10-CM | POA: Diagnosis not present

## 2020-09-09 DIAGNOSIS — E559 Vitamin D deficiency, unspecified: Secondary | ICD-10-CM

## 2020-09-09 MED ORDER — IOHEXOL 300 MG/ML  SOLN
100.0000 mL | Freq: Once | INTRAMUSCULAR | Status: AC | PRN
  Administered 2020-09-09: 100 mL via INTRAVENOUS

## 2020-09-09 NOTE — Telephone Encounter (Signed)
Please advise if refill is appropriate.  Pt's last refill was on 07/31/20. Pt instructed to f/u in 3 weeks, which would be 09/23/20, and has current app scheduled for 09/25/20.

## 2020-09-09 NOTE — Telephone Encounter (Signed)
Ok to rf x 1

## 2020-09-09 NOTE — Telephone Encounter (Signed)
Please advise 

## 2020-09-10 LAB — BASIC METABOLIC PANEL
BUN/Creatinine Ratio: 9 (ref 9–23)
BUN: 9 mg/dL (ref 6–24)
CO2: 25 mmol/L (ref 20–29)
Calcium: 9.7 mg/dL (ref 8.7–10.2)
Chloride: 100 mmol/L (ref 96–106)
Creatinine, Ser: 1 mg/dL (ref 0.57–1.00)
GFR calc Af Amer: 80 mL/min/{1.73_m2} (ref >59)
GFR calc non Af Amer: 69 mL/min/{1.73_m2} (ref >59)
Glucose: 174 mg/dL — ABNORMAL HIGH (ref 65–99)
Potassium: 4.2 mmol/L (ref 3.5–5.2)
Sodium: 138 mmol/L (ref 134–144)

## 2020-09-10 LAB — OVA 1
CEA: 1.2 ng/mL (ref 0.0–4.7)
Cancer Antigen (CA) 125: 11.3 U/mL (ref 0.0–38.1)
Lipid-Associated Sialic Acid: 16 mg/dL (ref 8–23)

## 2020-09-11 ENCOUNTER — Telehealth: Payer: Self-pay | Admitting: Obstetrics & Gynecology

## 2020-09-11 NOTE — Telephone Encounter (Signed)
T.C. to pt. I have reviewed with her the results of her [place], tumor markers and CT results.   I have answered her questions. I have told her that I have referred her case to [facility] for eval and management.   Pt would like a bilateral oophorectomy so that she does not have to deal with any other issues in the future.   She was also seen by GI and asked if a liver bx could be done at the time of any procedure. I have informed her that I do not have the privileged for that but, she could ask GYN ONC. I have informed her that it is not likely that this would be done but, that she could inquire.   Camarie Mctigue L. Harraway-Smith, M.D., Cherlynn June

## 2020-09-11 NOTE — Telephone Encounter (Signed)
TC to pt to review [place] and CT results. No answer. Left message to call back.   Deby Adger L. Harraway-Smith, M.D., Cherlynn June

## 2020-09-15 ENCOUNTER — Telehealth: Payer: Self-pay | Admitting: *Deleted

## 2020-09-15 NOTE — Telephone Encounter (Signed)
Called and scheduled the patient for a new patient appt for 12/3 at 9 am with Dr Berline Lopes. Patient given the address and phone number for the clinic. Patient also given the policy for mask and visitors

## 2020-09-23 ENCOUNTER — Telehealth: Payer: Self-pay | Admitting: Oncology

## 2020-09-23 NOTE — H&P (View-Only) (Signed)
GYNECOLOGIC ONCOLOGY NEW PATIENT CONSULTATION   Patient Name: Julie Fox  Patient Age: 44 y.o. Date of Service: 09/26/2020 Referring Provider: Dr. Ihor Dow   Primary Care Provider: Patient, No Pcp Per Consulting Provider: Jeral Pinch, MD   Assessment/Plan:  Premenopausal patient with enlarging complex pelvic mass.  We discussed findings on imaging studies that raise concern for possible malignancy.  The only way to make a definitive diagnosis is to proceed with surgical excision.  While it is reassuring that her tumor markers are normal, including a CEA and CA-125, we discussed that CA-1 25 can be normal and up to 30-50% of early stage ovarian cancer.  CT scan does not show evidence of metastatic disease, but if cancer is confirmed at the time of surgery, we discussed the importance of staging to detect microscopic disease which can affect adjuvant treatment.  The plan will be for robotic unilateral salpingo-oophorectomy versus bilateral.  I will send the abnormal ovary for frozen section.  If borderline tumor encountered, then both fallopian tubes and ovaries will be removed.  If cancer found at the time of frozen section, then we discussed additional staging procedures which may include pelvic and Pera-aortic lymphadenectomy, omentectomy, peritoneal biopsies, and other tumor debulking.  For removal of the mass as well as for palpation of the abdominal pelvic surfaces, we discussed the need for a mini laparotomy.  The patient feels very strongly about having both adnexa removed even if no cancer or borderline tumor encountered.  We discussed the risks associated with surgical menopause and the protective effects that hormones, notably estrogen, have.  In the setting of no genetic predisposition or increased risk of ovarian cancer, we know that all cause mortality is higher in women with iatrogenic early menopause.  She will think about our conversation and we will discuss final  plan the morning of surgery.  The patient is being worked up for transaminitis and abnormal liver findings on imaging.  Her gastroenterologist reached out to see if a liver biopsy could be performed at the time of her surgery.  I will work to coordinate this with one of the general surgeons.  In terms of her HIV, we discussed continuing her antiretroviral regimen as she normally does.  She has been stable on this for some time with an undetectable viral load.  A copy of this note was sent to the patient's referring provider.   65 minutes of total time was spent for this patient encounter, including preparation, face-to-face counseling with the patient and coordination of care, and documentation of the encounter.  Jeral Pinch, MD  Division of Gynecologic Oncology  Department of Obstetrics and Gynecology  [facility]  ___________________________________________  Chief Complaint: Chief Complaint  Patient presents with  . Pelvic mass    History of Present Illness:  Julie Fox is a 44 y.o. y.o. female who is seen in consultation at the request of Dr. Ihor Dow for an evaluation of complex adnexal mass.  Patient has a history of elevated liver enzymes, thought to be likely NAFLD related to her obesity.  She recently saw GI at [facility] given elevated LFTs and abnormal liver imaging. Differential diagnosis discussed included choledocholithiasis, non-ulcer dyspepsia, PUD and pancreatitis. Tentative plan was to attempt liver biopsy if lab evaluation and MRCP not diagnostic.   She saw Dr. Ihor Dow on 11/3 for gyn exam.  On her recent CT scan to evaluate her RUQ pain, she was noted to have an adnexal cyst.  Follow-up pelvic  ultrasound and repeat CT scan were concerning for enlargement of the mass as well as features raising suspicion for possible malignancy. She endorses about a 44-month history of discomfort and "constant cramp" in her pelvis.  While  always present, it fluctuates in intensity.  She denies any true weight change although fluctuates up and down 3-5 pounds.  Her appetite is unchanged.  She denies any nausea or emesis.  She endorses some early satiety and bloating.  The last month or so, she has developed diarrhea that happens approximately 30 minutes after eating.  She has not tried any medication to help with her bowel function.  Her GYN history is notable for a hysterectomy in 2019 for abnormal uterine bleeding.  She denies any vaginal bleeding since her surgery.  She lives in [place] with her mom.  She reports tobacco use with less than 6 cigarettes a day.  She currently works as a Music therapist for [employer].  PAST MEDICAL HISTORY:  Past Medical History:  Diagnosis Date  . Anxiety   . Asthma   . Depression   . GAD (generalized anxiety disorder)   . Gallbladder problem   . GERD (gastroesophageal reflux disease)    no meds, diet controlled  . Heartburn   . High cholesterol   . History of epilepsy    childhood seizures - last one age 29 yr, non since  . HIV (human immunodeficiency virus infection) ([place])   . Hx of abnormal cervical Pap smear    2012 per pt, then normal since   . Migraine    otc med prn  . Obesity   . Pure hypercholesterolemia   . Seasonal allergies   . Sleep apnea   . Vitamin B12 deficiency   . Vitamin D deficiency      PAST SURGICAL HISTORY:  Past Surgical History:  Procedure Laterality Date  . CHOLECYSTECTOMY    . COLONOSCOPY    . TOE SURGERY     age 52-cyst excision per patient  . TONSILLECTOMY  2016  . UPPER GI ENDOSCOPY    . VAGINAL HYSTERECTOMY Bilateral 01/03/2018   Procedure: HYSTERECTOMY VAGINAL WITH SALPINGECTOMY;  Surgeon: Lavonia Drafts, MD;  Location: [facility];  Service: Gynecology;  Laterality: Bilateral;  . WISDOM TOOTH EXTRACTION     x 2 lower    OB/GYN HISTORY:  OB History  Gravida Para Term Preterm AB Living  0 0 0 0 0 0  SAB TAB Ectopic  Multiple Live Births  0 0 0 0 0    Patient's last menstrual period was 12/26/2017.  Age at menarche: 4 Age at menopause: n/a Hx of HRT: Denies Hx of STDs: HIV+ Last pap: 2018 History of abnormal pap smears: yes - remote history  SCREENING STUDIES:  Last mammogram: 06/2020   MEDICATIONS: Outpatient Encounter Medications as of 09/26/2020  Medication Sig  . busPIRone (BUSPAR) 7.5 MG tablet Take 1 tablet (7.5 mg total) by mouth 3 (three) times daily as needed (anxiety). (Patient taking differently: Take 7.5 mg by mouth 3 (three) times daily. )  . elvitegravir-cobicistat-emtricitabine-tenofovir (GENVOYA) 150-150-200-10 MG TABS tablet Take 1 tablet by mouth daily with breakfast.  . omeprazole (PRILOSEC) 40 MG capsule Take 40 mg by mouth daily.  Marland Kitchen PARoxetine (PAXIL) 40 MG tablet Take 1 tablet (40 mg total) by mouth daily.  . Vitamin D, Ergocalciferol, (DRISDOL) 1.25 MG (50000 UNIT) CAPS capsule Take 1 capsule (50,000 Units total) by mouth every 7 (seven) days.  Marland Kitchen ibuprofen (ADVIL) 600 MG tablet Take  1 tablet (600 mg total) by mouth every 8 (eight) hours as needed for moderate pain. For AFTER surgery only  . oxyCODONE (OXY IR/ROXICODONE) 5 MG immediate release tablet Take 1 tablet (5 mg total) by mouth every 4 (four) hours as needed for severe pain. For AFTER surgery only, do not take and drive  . Semaglutide-Weight Management (WEGOVY) 0.5 MG/0.5ML SOAJ Inject 0.5 mg into the skin once a week. (Patient not taking: Reported on 09/25/2020)  . senna-docusate (SENOKOT-S) 8.6-50 MG tablet Take 2 tablets by mouth at bedtime. For AFTER surgery, do not take if having diarrhea  . [DISCONTINUED] atorvastatin (LIPITOR) 20 MG tablet TAKE 1 TABLET(20 MG) BY MOUTH DAILY (Patient not taking: Reported on 09/24/2020)  . [DISCONTINUED] omeprazole (PRILOSEC) 40 MG capsule Take 40 mg by mouth daily.  . [DISCONTINUED] Vitamin D, Ergocalciferol, (DRISDOL) 1.25 MG (50000 UNIT) CAPS capsule TAKE 1 CAPSULE BY MOUTH EVERY  7 DAYS   No facility-administered encounter medications on file as of 09/26/2020.    ALLERGIES:  Allergies  Allergen Reactions  . Penicillins Anaphylaxis    Has patient had a PCN reaction causing immediate rash, facial/tongue/throat swelling, SOB or lightheadedness with hypotension: yes Has patient had a PCN reaction causing severe rash involving mucus membranes or skin necrosis: no Has patient had a PCN reaction that required hospitalization: no Has patient had a PCN reaction occurring within the last 10 years: yes If all of the above answers are "NO", then may proceed with Cephalosporin use.   . Sulfa Antibiotics Rash     FAMILY HISTORY:  Family History  Problem Relation Age of Onset  . Hypertension Mother   . Hyperlipidemia Mother   . Diabetes Mother   . Heart disease Mother   . Depression Mother   . Anxiety disorder Mother   . Sleep apnea Mother   . Obesity Mother   . Liver cancer Father   . Non-Hodgkin's lymphoma Maternal Grandmother   . Leukemia Maternal Grandfather   . Breast cancer Neg Hx   . Ovarian cancer Neg Hx   . Endometrial cancer Neg Hx   . Pancreatic cancer Neg Hx   . Colon cancer Neg Hx      SOCIAL HISTORY:    Social Connections:   . Frequency of Communication with Friends and Family: Not on file  . Frequency of Social Gatherings with Friends and Family: Not on file  . Attends Religious Services: Not on file  . Active Member of Clubs or Organizations: Not on file  . Attends Archivist Meetings: Not on file  . Marital Status: Not on file    REVIEW OF SYSTEMS:  + Fatigue, abdominal pain, diarrhea, urinary frequency, dizziness, headache, bruising/bleeding easily, anxiety, depression Denies appetite changes, fevers, chills, unexplained weight changes. Denies hearing loss, neck lumps or masses, mouth sores, ringing in ears or voice changes. Denies cough or wheezing.  Denies shortness of breath. Denies chest pain or palpitations. Denies leg  swelling. Denies abdominal distention, pain, blood in stools, constipation, nausea, vomiting, or early satiety. Denies pain with intercourse, dysuria, hematuria or incontinence. Denies hot flashes, pelvic pain, vaginal bleeding or vaginal discharge.   Denies joint pain, back pain or muscle pain/cramps. Denies itching, rash, or wounds. Denies numbness or seizures. Denies swollen lymph nodes or glands. Denies confusion, or decreased concentration.  Physical Exam:  Vital Signs for this encounter:  Blood pressure 139/67, pulse 82, temperature 98 F (36.7 C), temperature source Tympanic, resp. rate 20, height 5\' 8"  (1.727 m),  weight 287 lb (130.2 kg), last menstrual period 12/26/2017. Body mass index is 43.64 kg/m. General: Alert, oriented, no acute distress.  HEENT: Normocephalic, atraumatic. Sclera anicteric.  Chest: Clear to auscultation bilaterally. No wheezes, rhonchi, or rales. Cardiovascular: Regular rate and rhythm, no murmurs, rubs, or gallops.  Abdomen: Obese. Normoactive bowel sounds. Soft, nondistended, nontender to palpation. No masses or hepatosplenomegaly appreciated. No palpable fluid wave.  Extremities: Grossly normal range of motion. Warm, well perfused. No edema bilaterally.  Skin: No rashes or lesions.  Lymphatics: No cervical, supraclavicular, or inguinal adenopathy.  GU:  Normal external female genitalia.  No lesions. No discharge or bleeding.  On bimanual exam, smooth mobile mass appreciated in the cul-de-sac in the midline and to the right.  Patient has discomfort with the exam.  No nodularity appreciated.  LABORATORY AND RADIOLOGIC DATA:  Outside medical records were reviewed to synthesize the above history, along with the history and physical obtained during the visit.   OVA1: CA-125: 11.3 CEA: 1.2 L-ASA: 16  Lab Results  Component Value Date   WBC 7.2 07/23/2020   HGB 14.3 07/23/2020   HCT 41.8 07/23/2020   PLT 336 07/23/2020   GLUCOSE 174 (H) 09/04/2020    CHOL 160 07/23/2020   TRIG 150 (H) 07/23/2020   HDL 42 (L) 07/23/2020   LDLCALC 93 07/23/2020   ALT 83 (H) 07/23/2020   AST 47 (H) 07/23/2020   NA 138 09/04/2020   K 4.2 09/04/2020   CL 100 09/04/2020   CREATININE 1.00 09/04/2020   BUN 9 09/04/2020   CO2 25 09/04/2020   TSH 2.850 12/05/2019   HGBA1C 5.9 (H) 04/22/2020   CT A/P on 11/16: Lower chest: Linear scarring in the right middle lobe. Heart is at the upper limits of normal in size. No pericardial or pleural effusion. Distal esophagus is unremarkable.  Hepatobiliary: Liver is unremarkable. Cholecystectomy. No biliary ductal dilatation.  Pancreas: Negative.  Spleen: Negative.  Adrenals/Urinary Tract: Adrenal glands and kidneys are unremarkable. Ureters are decompressed. Bladder is grossly unremarkable.  Stomach/Bowel: Stomach, small bowel, appendix and colon are unremarkable.  Vascular/Lymphatic: Vascular structures are unremarkable. Circumaortic left renal vein. External iliac lymph nodes measure up to 10 mm on the right (2/85), unchanged.  Reproductive: Central low pelvic mass likely arises from the left ovary and measures 7.8 x 9.6 cm (2/79) with multiple septations and possible solid components. Lesion is increased in size from 5.3 x 6.1 cm on 08/06/2020. Hysterectomy.  Other: No free fluid.  Mesenteries and peritoneum are unremarkable.  Musculoskeletal: None.  IMPRESSION: Enlarging complex cystic pelvic mass, likely rising from the left ovary and highly worrisome for a cystic ovarian neoplasm, as on recent pelvic ultrasound 09/02/2020 and CT abdomen pelvis 08/06/2020.  Pelvic ultrasound on 09/02/20: Uterus  Surgically absent  Endometrium  N/A  Right ovary  Measurements: 1.7 x 1.3 x 1.9 cm = volume: 2.1 mL. Poorly visualized due to bowel. No gross abnormality on limited assessment  Left ovary  Measurements: 7.9 x 4.6 x 7.6 cm = volume: 144 mL. Large complicated cyst within LEFT ovary,  6.4 x 4.2 x 5.6 cm containing scattered internal debris, thick irregular septations, and mural nodularity. Blood flow identified within mural nodularity and some of the septations. Finding is highly suspicious for an ovarian neoplasm  Other findings:  No free pelvic fluid.  No adnexal masses.  IMPRESSION: Unremarkable RIGHT ovary and adnexa.  Large complicated cyst of the LEFT ovary 6.4 cm greatest size, containing mural nodularity, septations, and blood  flow within the septations and nodularity.  Finding is highly suspicious for an ovarian neoplasm; surgical evaluation recommended.  CT A/P on 08/06/20: 1.  No acute process or explanation for right upper quadrant pain. 2. Macrolobulated 6.1 cm cystic lesion, favored to arise from the left ovary. Recommend further evaluation with pre and post contrast pelvic MRI (preferred) versus ultrasound. This recommendation follows ACR consensus guidelines: White Paper of the ACR Incidental Findings Committee II on Adnexal Findings. J Am Coll Radiol [phone].  3. Hepatomegaly.

## 2020-09-23 NOTE — Telephone Encounter (Signed)
Left a message regarding possible surgery date.  Requested a return call.

## 2020-09-23 NOTE — Progress Notes (Signed)
GYNECOLOGIC ONCOLOGY NEW PATIENT CONSULTATION   Patient Name: Julie Fox  Patient Age: 44 y.o. Date of Service: 09/26/2020 Referring Provider: Dr. Ihor Dow   Primary Care Provider: Patient, No Pcp Per Consulting Provider: Jeral Pinch, MD   Assessment/Plan:  Premenopausal patient with enlarging complex pelvic mass.  We discussed findings on imaging studies that raise concern for possible malignancy.  The only way to make a definitive diagnosis is to proceed with surgical excision.  While it is reassuring that her tumor markers are normal, including a CEA and CA-125, we discussed that CA-1 25 can be normal and up to 30-50% of early stage ovarian cancer.  CT scan does not show evidence of metastatic disease, but if cancer is confirmed at the time of surgery, we discussed the importance of staging to detect microscopic disease which can affect adjuvant treatment.  The plan will be for robotic unilateral salpingo-oophorectomy versus bilateral.  I will send the abnormal ovary for frozen section.  If borderline tumor encountered, then both fallopian tubes and ovaries will be removed.  If cancer found at the time of frozen section, then we discussed additional staging procedures which may include pelvic and Pera-aortic lymphadenectomy, omentectomy, peritoneal biopsies, and other tumor debulking.  For removal of the mass as well as for palpation of the abdominal pelvic surfaces, we discussed the need for a mini laparotomy.  The patient feels very strongly about having both adnexa removed even if no cancer or borderline tumor encountered.  We discussed the risks associated with surgical menopause and the protective effects that hormones, notably estrogen, have.  In the setting of no genetic predisposition or increased risk of ovarian cancer, we know that all cause mortality is higher in women with iatrogenic early menopause.  She will think about our conversation and we will discuss final  plan the morning of surgery.  The patient is being worked up for transaminitis and abnormal liver findings on imaging.  Her gastroenterologist reached out to see if a liver biopsy could be performed at the time of her surgery.  I will work to coordinate this with one of the general surgeons.  In terms of her HIV, we discussed continuing her antiretroviral regimen as she normally does.  She has been stable on this for some time with an undetectable viral load.  A copy of this note was sent to the patient's referring provider.   65 minutes of total time was spent for this patient encounter, including preparation, face-to-face counseling with the patient and coordination of care, and documentation of the encounter.  Jeral Pinch, MD  Division of Gynecologic Oncology  Department of Obstetrics and Gynecology  [facility]  ___________________________________________  Chief Complaint: Chief Complaint  Patient presents with  . Pelvic mass    History of Present Illness:  Julie Fox is a 44 y.o. y.o. female who is seen in consultation at the request of Dr. Ihor Dow for an evaluation of complex adnexal mass.  Patient has a history of elevated liver enzymes, thought to be likely NAFLD related to her obesity.  She recently saw GI at [facility] given elevated LFTs and abnormal liver imaging. Differential diagnosis discussed included choledocholithiasis, non-ulcer dyspepsia, PUD and pancreatitis. Tentative plan was to attempt liver biopsy if lab evaluation and MRCP not diagnostic.   She saw Dr. Ihor Dow on 11/3 for gyn exam.  On her recent CT scan to evaluate her RUQ pain, she was noted to have an adnexal cyst.  Follow-up pelvic  ultrasound and repeat CT scan were concerning for enlargement of the mass as well as features raising suspicion for possible malignancy. She endorses about a 56-month history of discomfort and "constant cramp" in her pelvis.  While  always present, it fluctuates in intensity.  She denies any true weight change although fluctuates up and down 3-5 pounds.  Her appetite is unchanged.  She denies any nausea or emesis.  She endorses some early satiety and bloating.  The last month or so, she has developed diarrhea that happens approximately 30 minutes after eating.  She has not tried any medication to help with her bowel function.  Her GYN history is notable for a hysterectomy in 2019 for abnormal uterine bleeding.  She denies any vaginal bleeding since her surgery.  She lives in [place] with her mom.  She reports tobacco use with less than 6 cigarettes a day.  She currently works as a Music therapist for [employer].  PAST MEDICAL HISTORY:  Past Medical History:  Diagnosis Date  . Anxiety   . Asthma   . Depression   . GAD (generalized anxiety disorder)   . Gallbladder problem   . GERD (gastroesophageal reflux disease)    no meds, diet controlled  . Heartburn   . High cholesterol   . History of epilepsy    childhood seizures - last one age 34 yr, non since  . HIV (human immunodeficiency virus infection) ([place])   . Hx of abnormal cervical Pap smear    2012 per pt, then normal since   . Migraine    otc med prn  . Obesity   . Pure hypercholesterolemia   . Seasonal allergies   . Sleep apnea   . Vitamin B12 deficiency   . Vitamin D deficiency      PAST SURGICAL HISTORY:  Past Surgical History:  Procedure Laterality Date  . CHOLECYSTECTOMY    . COLONOSCOPY    . TOE SURGERY     age 71-cyst excision per patient  . TONSILLECTOMY  2016  . UPPER GI ENDOSCOPY    . VAGINAL HYSTERECTOMY Bilateral 01/03/2018   Procedure: HYSTERECTOMY VAGINAL WITH SALPINGECTOMY;  Surgeon: Lavonia Drafts, MD;  Location: [facility];  Service: Gynecology;  Laterality: Bilateral;  . WISDOM TOOTH EXTRACTION     x 2 lower    OB/GYN HISTORY:  OB History  Gravida Para Term Preterm AB Living  0 0 0 0 0 0  SAB TAB Ectopic  Multiple Live Births  0 0 0 0 0    Patient's last menstrual period was 12/26/2017.  Age at menarche: 42 Age at menopause: n/a Hx of HRT: Denies Hx of STDs: HIV+ Last pap: 2018 History of abnormal pap smears: yes - remote history  SCREENING STUDIES:  Last mammogram: 06/2020   MEDICATIONS: Outpatient Encounter Medications as of 09/26/2020  Medication Sig  . busPIRone (BUSPAR) 7.5 MG tablet Take 1 tablet (7.5 mg total) by mouth 3 (three) times daily as needed (anxiety). (Patient taking differently: Take 7.5 mg by mouth 3 (three) times daily. )  . elvitegravir-cobicistat-emtricitabine-tenofovir (GENVOYA) 150-150-200-10 MG TABS tablet Take 1 tablet by mouth daily with breakfast.  . omeprazole (PRILOSEC) 40 MG capsule Take 40 mg by mouth daily.  Marland Kitchen PARoxetine (PAXIL) 40 MG tablet Take 1 tablet (40 mg total) by mouth daily.  . Vitamin D, Ergocalciferol, (DRISDOL) 1.25 MG (50000 UNIT) CAPS capsule Take 1 capsule (50,000 Units total) by mouth every 7 (seven) days.  Marland Kitchen ibuprofen (ADVIL) 600 MG tablet Take  1 tablet (600 mg total) by mouth every 8 (eight) hours as needed for moderate pain. For AFTER surgery only  . oxyCODONE (OXY IR/ROXICODONE) 5 MG immediate release tablet Take 1 tablet (5 mg total) by mouth every 4 (four) hours as needed for severe pain. For AFTER surgery only, do not take and drive  . Semaglutide-Weight Management (WEGOVY) 0.5 MG/0.5ML SOAJ Inject 0.5 mg into the skin once a week. (Patient not taking: Reported on 09/25/2020)  . senna-docusate (SENOKOT-S) 8.6-50 MG tablet Take 2 tablets by mouth at bedtime. For AFTER surgery, do not take if having diarrhea  . [DISCONTINUED] atorvastatin (LIPITOR) 20 MG tablet TAKE 1 TABLET(20 MG) BY MOUTH DAILY (Patient not taking: Reported on 09/24/2020)  . [DISCONTINUED] omeprazole (PRILOSEC) 40 MG capsule Take 40 mg by mouth daily.  . [DISCONTINUED] Vitamin D, Ergocalciferol, (DRISDOL) 1.25 MG (50000 UNIT) CAPS capsule TAKE 1 CAPSULE BY MOUTH EVERY  7 DAYS   No facility-administered encounter medications on file as of 09/26/2020.    ALLERGIES:  Allergies  Allergen Reactions  . Penicillins Anaphylaxis    Has patient had a PCN reaction causing immediate rash, facial/tongue/throat swelling, SOB or lightheadedness with hypotension: yes Has patient had a PCN reaction causing severe rash involving mucus membranes or skin necrosis: no Has patient had a PCN reaction that required hospitalization: no Has patient had a PCN reaction occurring within the last 10 years: yes If all of the above answers are "NO", then may proceed with Cephalosporin use.   . Sulfa Antibiotics Rash     FAMILY HISTORY:  Family History  Problem Relation Age of Onset  . Hypertension Mother   . Hyperlipidemia Mother   . Diabetes Mother   . Heart disease Mother   . Depression Mother   . Anxiety disorder Mother   . Sleep apnea Mother   . Obesity Mother   . Liver cancer Father   . Non-Hodgkin's lymphoma Maternal Grandmother   . Leukemia Maternal Grandfather   . Breast cancer Neg Hx   . Ovarian cancer Neg Hx   . Endometrial cancer Neg Hx   . Pancreatic cancer Neg Hx   . Colon cancer Neg Hx      SOCIAL HISTORY:    Social Connections:   . Frequency of Communication with Friends and Family: Not on file  . Frequency of Social Gatherings with Friends and Family: Not on file  . Attends Religious Services: Not on file  . Active Member of Clubs or Organizations: Not on file  . Attends Archivist Meetings: Not on file  . Marital Status: Not on file    REVIEW OF SYSTEMS:  + Fatigue, abdominal pain, diarrhea, urinary frequency, dizziness, headache, bruising/bleeding easily, anxiety, depression Denies appetite changes, fevers, chills, unexplained weight changes. Denies hearing loss, neck lumps or masses, mouth sores, ringing in ears or voice changes. Denies cough or wheezing.  Denies shortness of breath. Denies chest pain or palpitations. Denies leg  swelling. Denies abdominal distention, pain, blood in stools, constipation, nausea, vomiting, or early satiety. Denies pain with intercourse, dysuria, hematuria or incontinence. Denies hot flashes, pelvic pain, vaginal bleeding or vaginal discharge.   Denies joint pain, back pain or muscle pain/cramps. Denies itching, rash, or wounds. Denies numbness or seizures. Denies swollen lymph nodes or glands. Denies confusion, or decreased concentration.  Physical Exam:  Vital Signs for this encounter:  Blood pressure 139/67, pulse 82, temperature 98 F (36.7 C), temperature source Tympanic, resp. rate 20, height 5\' 8"  (1.727 m),  weight 287 lb (130.2 kg), last menstrual period 12/26/2017. Body mass index is 43.64 kg/m. General: Alert, oriented, no acute distress.  HEENT: Normocephalic, atraumatic. Sclera anicteric.  Chest: Clear to auscultation bilaterally. No wheezes, rhonchi, or rales. Cardiovascular: Regular rate and rhythm, no murmurs, rubs, or gallops.  Abdomen: Obese. Normoactive bowel sounds. Soft, nondistended, nontender to palpation. No masses or hepatosplenomegaly appreciated. No palpable fluid wave.  Extremities: Grossly normal range of motion. Warm, well perfused. No edema bilaterally.  Skin: No rashes or lesions.  Lymphatics: No cervical, supraclavicular, or inguinal adenopathy.  GU:  Normal external female genitalia.  No lesions. No discharge or bleeding.  On bimanual exam, smooth mobile mass appreciated in the cul-de-sac in the midline and to the right.  Patient has discomfort with the exam.  No nodularity appreciated.  LABORATORY AND RADIOLOGIC DATA:  Outside medical records were reviewed to synthesize the above history, along with the history and physical obtained during the visit.   OVA1: CA-125: 11.3 CEA: 1.2 L-ASA: 16  Lab Results  Component Value Date   WBC 7.2 07/23/2020   HGB 14.3 07/23/2020   HCT 41.8 07/23/2020   PLT 336 07/23/2020   GLUCOSE 174 (H) 09/04/2020    CHOL 160 07/23/2020   TRIG 150 (H) 07/23/2020   HDL 42 (L) 07/23/2020   LDLCALC 93 07/23/2020   ALT 83 (H) 07/23/2020   AST 47 (H) 07/23/2020   NA 138 09/04/2020   K 4.2 09/04/2020   CL 100 09/04/2020   CREATININE 1.00 09/04/2020   BUN 9 09/04/2020   CO2 25 09/04/2020   TSH 2.850 12/05/2019   HGBA1C 5.9 (H) 04/22/2020   CT A/P on 11/16: Lower chest: Linear scarring in the right middle lobe. Heart is at the upper limits of normal in size. No pericardial or pleural effusion. Distal esophagus is unremarkable.  Hepatobiliary: Liver is unremarkable. Cholecystectomy. No biliary ductal dilatation.  Pancreas: Negative.  Spleen: Negative.  Adrenals/Urinary Tract: Adrenal glands and kidneys are unremarkable. Ureters are decompressed. Bladder is grossly unremarkable.  Stomach/Bowel: Stomach, small bowel, appendix and colon are unremarkable.  Vascular/Lymphatic: Vascular structures are unremarkable. Circumaortic left renal vein. External iliac lymph nodes measure up to 10 mm on the right (2/85), unchanged.  Reproductive: Central low pelvic mass likely arises from the left ovary and measures 7.8 x 9.6 cm (2/79) with multiple septations and possible solid components. Lesion is increased in size from 5.3 x 6.1 cm on 08/06/2020. Hysterectomy.  Other: No free fluid.  Mesenteries and peritoneum are unremarkable.  Musculoskeletal: None.  IMPRESSION: Enlarging complex cystic pelvic mass, likely rising from the left ovary and highly worrisome for a cystic ovarian neoplasm, as on recent pelvic ultrasound 09/02/2020 and CT abdomen pelvis 08/06/2020.  Pelvic ultrasound on 09/02/20: Uterus  Surgically absent  Endometrium  N/A  Right ovary  Measurements: 1.7 x 1.3 x 1.9 cm = volume: 2.1 mL. Poorly visualized due to bowel. No gross abnormality on limited assessment  Left ovary  Measurements: 7.9 x 4.6 x 7.6 cm = volume: 144 mL. Large complicated cyst within LEFT ovary,  6.4 x 4.2 x 5.6 cm containing scattered internal debris, thick irregular septations, and mural nodularity. Blood flow identified within mural nodularity and some of the septations. Finding is highly suspicious for an ovarian neoplasm  Other findings:  No free pelvic fluid.  No adnexal masses.  IMPRESSION: Unremarkable RIGHT ovary and adnexa.  Large complicated cyst of the LEFT ovary 6.4 cm greatest size, containing mural nodularity, septations, and blood  flow within the septations and nodularity.  Finding is highly suspicious for an ovarian neoplasm; surgical evaluation recommended.  CT A/P on 08/06/20: 1.  No acute process or explanation for right upper quadrant pain. 2. Macrolobulated 6.1 cm cystic lesion, favored to arise from the left ovary. Recommend further evaluation with pre and post contrast pelvic MRI (preferred) versus ultrasound. This recommendation follows ACR consensus guidelines: White Paper of the ACR Incidental Findings Committee II on Adnexal Findings. J Am Coll Radiol [id number].  3. Hepatomegaly.

## 2020-09-24 ENCOUNTER — Ambulatory Visit (INDEPENDENT_AMBULATORY_CARE_PROVIDER_SITE_OTHER): Payer: 59 | Admitting: Family Medicine

## 2020-09-24 ENCOUNTER — Other Ambulatory Visit: Payer: Self-pay | Admitting: Gynecologic Oncology

## 2020-09-24 ENCOUNTER — Other Ambulatory Visit: Payer: Self-pay

## 2020-09-24 ENCOUNTER — Encounter (INDEPENDENT_AMBULATORY_CARE_PROVIDER_SITE_OTHER): Payer: Self-pay | Admitting: Family Medicine

## 2020-09-24 VITALS — BP 122/79 | HR 67 | Temp 98.1°F | Ht 68.0 in | Wt 285.0 lb

## 2020-09-24 DIAGNOSIS — E559 Vitamin D deficiency, unspecified: Secondary | ICD-10-CM | POA: Diagnosis not present

## 2020-09-24 DIAGNOSIS — F419 Anxiety disorder, unspecified: Secondary | ICD-10-CM | POA: Diagnosis not present

## 2020-09-24 DIAGNOSIS — E7849 Other hyperlipidemia: Secondary | ICD-10-CM

## 2020-09-24 DIAGNOSIS — Z9189 Other specified personal risk factors, not elsewhere classified: Secondary | ICD-10-CM | POA: Diagnosis not present

## 2020-09-24 DIAGNOSIS — Z6841 Body Mass Index (BMI) 40.0 and over, adult: Secondary | ICD-10-CM

## 2020-09-24 DIAGNOSIS — R1900 Intra-abdominal and pelvic swelling, mass and lump, unspecified site: Secondary | ICD-10-CM

## 2020-09-24 MED ORDER — VITAMIN D (ERGOCALCIFEROL) 1.25 MG (50000 UNIT) PO CAPS: 50000.0000 [IU] | ORAL_CAPSULE | ORAL | 0 refills | Status: DC

## 2020-09-24 MED ORDER — BUSPIRONE HCL 7.5 MG PO TABS: 7.5000 mg | ORAL_TABLET | Freq: Three times a day (TID) | ORAL | 0 refills | Status: DC | PRN

## 2020-09-24 NOTE — Telephone Encounter (Signed)
Sakara called back and was advised that we are holding 10/02/20 as a possible surgery date for her. She verbalized agreement and said that date works for her.

## 2020-09-25 ENCOUNTER — Encounter: Payer: Self-pay | Admitting: Gynecologic Oncology

## 2020-09-25 ENCOUNTER — Other Ambulatory Visit: Payer: Self-pay

## 2020-09-25 DIAGNOSIS — N9489 Other specified conditions associated with female genital organs and menstrual cycle: Secondary | ICD-10-CM

## 2020-09-25 NOTE — Progress Notes (Signed)
Chief Complaint:   OBESITY Julie Fox is here to discuss her progress with her obesity treatment plan along with follow-up of her obesity related diagnoses. Julie Fox is on keeping a food journal and adhering to recommended goals of 1400-1700 calories and 95+ grams of protein and states she is following her eating plan approximately 95-100% of the time. Julie Fox states she is not exercising regularly at this time.  Today's visit was #: 61 Starting weight: 283 lbs Starting date: 12/05/2019 Today's weight: 285 lbs Today's date: 09/24/2020 Total lbs lost to date: 0 Total lbs lost since last in-office visit: 0  Interim History: Julie Fox has surgery scheduled next week for likely early ovarian cancer.  She has been doing some stress eating and is feeling a heightened sense of anxiety.  She reports that she has been seeing at least 1 doctor per week.  She is having some work stress as well.  She felt she got in quite a bit of nutrition during the Thanksgiving holiday.  Subjective:   1. Vitamin D deficiency [name]'s Vitamin D level was 19.8 on 04/22/2020. She is currently taking prescription vitamin D 50,000 IU each week. She denies nausea, vomiting or muscle weakness.  She endorses fatigue.  2. Other hyperlipidemia [name] was told to stop her statin due to elevated LFTs.  Last LDL was 93.  Lab Results  Component Value Date   ALT 83 (H) 07/23/2020   AST 47 (H) 07/23/2020   ALKPHOS 127 (H) 05/13/2020   BILITOT 0.3 07/23/2020   Lab Results  Component Value Date   CHOL 160 07/23/2020   HDL 42 (L) 07/23/2020   LDLCALC 93 07/23/2020   TRIG 150 (H) 07/23/2020   CHOLHDL 3.8 07/23/2020   3. Anxiety Julie Fox is having increased symptoms of anxiety with recent likely cancer diagnosis.  She is on Paxil.  4. At risk for anxiety Julie Fox is at risk of developing anxiety due to recent cancer diagnosis.  Assessment/Plan:   1. Vitamin D deficiency Low Vitamin D level contributes to fatigue and  are associated with obesity, breast, and colon cancer. She agrees to continue to take prescription Vitamin D @50 ,000 IU every week and will follow-up for routine testing of Vitamin D, at least 2-3 times per year to avoid over-replacement.  -Refill Vitamin D, Ergocalciferol, (DRISDOL) 1.25 MG (50000 UNIT) CAPS capsule; Take 1 capsule (50,000 Units total) by mouth every 7 (seven) days.  Dispense: 4 capsule; Refill: 0  2. Other hyperlipidemia Follow-up labs in 2022.  3. Anxiety Will start Buspar 7.5 mg, as per below.  -Start busPIRone (BUSPAR) 7.5 MG tablet; Take 1 tablet (7.5 mg total) by mouth 3 (three) times daily as needed (anxiety).  Dispense: 45 tablet; Refill: 0  4. At risk for anxiety Julie Fox was given approximately 15 minutes of anxiety risk counseling today. She has risk factors for anxiety including recent diagnosis of ovarian mass. We discussed the importance of a healthy work life balance, a healthy relationship with food and a good support system.  Repetitive spaced learning was employed today to elicit superior memory formation and behavioral change.  5. Class 3 severe obesity with serious comorbidity and body mass index (BMI) of 40.0 to 44.9 in adult, unspecified obesity type [facility])  Julie Fox is currently in the action stage of change. As such, her goal is to continue with weight loss efforts. She has agreed to keeping a food journal and adhering to recommended goals of 1400-1700 calories and 95 grams of protein.  Exercise goals: No exercise has been prescribed at this time.  Behavioral modification strategies: increasing lean protein intake, meal planning and cooking strategies, keeping healthy foods in the home, planning for success and decreasing junk food.  Julie Fox has agreed to follow-up with our clinic in 2-4 weeks. She was informed of the importance of frequent follow-up visits to maximize her success with intensive lifestyle modifications for her multiple health  conditions.   Objective:   Blood pressure 122/79, pulse 67, temperature 98.1 F (36.7 C), temperature source Oral, height 5\' 8"  (1.727 m), weight 285 lb (129.3 kg), last menstrual period 12/26/2017, SpO2 94 %. Body mass index is 43.33 kg/m.  General: Cooperative, alert, well developed, in no acute distress. HEENT: Conjunctivae and lids unremarkable. Cardiovascular: Regular rhythm.  Lungs: Normal work of breathing. Neurologic: No focal deficits.   Lab Results  Component Value Date   CREATININE 1.00 09/04/2020   BUN 9 09/04/2020   NA 138 09/04/2020   K 4.2 09/04/2020   CL 100 09/04/2020   CO2 25 09/04/2020   Lab Results  Component Value Date   ALT 83 (H) 07/23/2020   AST 47 (H) 07/23/2020   ALKPHOS 127 (H) 05/13/2020   BILITOT 0.3 07/23/2020   Lab Results  Component Value Date   HGBA1C 5.9 (H) 04/22/2020   HGBA1C 6.0 (H) 12/05/2019   HGBA1C 5.8 06/20/2018   HGBA1C 5.7 06/13/2017   Lab Results  Component Value Date   INSULIN 17.2 04/22/2020   INSULIN 20.8 12/05/2019   Lab Results  Component Value Date   TSH 2.850 12/05/2019   Lab Results  Component Value Date   CHOL 160 07/23/2020   HDL 42 (L) 07/23/2020   LDLCALC 93 07/23/2020   TRIG 150 (H) 07/23/2020   CHOLHDL 3.8 07/23/2020   Lab Results  Component Value Date   WBC 7.2 07/23/2020   HGB 14.3 07/23/2020   HCT 41.8 07/23/2020   MCV 94.6 07/23/2020   PLT 336 07/23/2020   Attestation Statements:   Reviewed by clinician on day of visit: allergies, medications, problem list, medical history, surgical history, family history, social history, and previous encounter notes.  I, Water quality scientist, CMA, am acting as transcriptionist for Coralie Common, MD.  I have reviewed the above documentation for accuracy and completeness, and I agree with the above. - Jinny Blossom, MD

## 2020-09-25 NOTE — Patient Instructions (Signed)
Preparing for your Surgery  Plan for surgery on October 02, 2020 with Dr. Jeral Pinch at [facility] will be scheduled for a robotic assisted laparoscopic unilateral vs bilateral salpingo-oophorectomy (removal of one ovary and fallopian tube vs both ovaries), possible staging if borderline or cancer identified, possible laparotomy (larger incision on your abdomen if needed).   Pre-operative Testing -You will receive a phone call from presurgical testing at [facility] to arrange for a pre-operative appointment, lab appointment, and COVID test. The COVID test normally happens 3 days prior to the surgery and they ask that you self quarantine after the test up until surgery to decrease chance of exposure.  -Bring your insurance card, copy of an advanced directive if applicable, medication list  -At that visit, you will be asked to sign a consent for a possible blood transfusion in case a transfusion becomes necessary during surgery.  The need for a blood transfusion is rare but having consent is a necessary part of your care.     -You should not be taking blood thinners or aspirin at least ten days prior to surgery unless instructed by your surgeon.  -Do not take supplements such as fish oil (omega 3), red yeast rice, turmeric before your surgery.   Day Before Surgery at [facility] will be asked to take in a light diet the day before surgery. You will be advised you can have clear liquids up until 3 hours before your surgery.    Eat a light diet the day before surgery.  Examples including soups, broths, toast, yogurt, mashed potatoes.  AVOID GAS PRODUCING FOODS. Things to avoid include carbonated beverages (fizzy beverages), raw fruits and raw vegetables, or beans.   If your bowels are filled with gas, your surgeon will have difficulty visualizing your pelvic organs which increases your surgical risks.  Your role in recovery Your role is to become active as soon as  directed by your doctor, while still giving yourself time to heal.  Rest when you feel tired. You will be asked to do the following in order to speed your recovery:  - Cough and breathe deeply. This helps to clear and expand your lungs and can prevent pneumonia after surgery.  - [place]. Do mild physical activity. Walking or moving your legs help your circulation and body functions return to normal. Do not try to get up or walk alone the first time after surgery.   -If you develop swelling on one leg or the other, pain in the back of your leg, redness/warmth in one of your legs, please call the office or go to the Emergency Room to have a doppler to rule out a blood clot. For shortness of breath, chest pain-seek care in the Emergency Room as soon as possible. - Actively manage your pain. Managing your pain lets you move in comfort. We will ask you to rate your pain on a scale of zero to 10. It is your responsibility to tell your doctor or nurse where and how much you hurt so your pain can be treated.  Special Considerations -If you are diabetic, you may be placed on insulin after surgery to have closer control over your blood sugars to promote healing and recovery.  This does not mean that you will be discharged on insulin.  If applicable, your oral antidiabetics will be resumed when you are tolerating a solid diet.  -Your final pathology results from surgery should be available around  one week after surgery and the results will be relayed to you when available.  -Dr. Lahoma Crocker is the surgeon that assists your GYN Oncologist with surgery.  If you end up staying the night, the next day after your surgery you will either see Dr. Denman George, Dr. Berline Lopes, or Dr. Lahoma Crocker.  -FMLA forms can be faxed to [phone] and please allow 5-7 business days for completion.  Pain Management After Surgery -You have been prescribed your pain medication and bowel regimen medications  before surgery so that you can have these available when you are discharged from the hospital. The pain medication is for use ONLY AFTER surgery and a new prescription will not be given.   -Make sure that you have Tylenol and Ibuprofen at home to use on a regular basis after surgery for pain control. We recommend alternating the medications every hour to six hours since they work differently and are processed in the body differently for pain relief.  -Review the attached handout on narcotic use and their risks and side effects.   Bowel Regimen -You have been prescribed Sennakot-S to take nightly to prevent constipation especially if you are taking the narcotic pain medication intermittently.  It is important to prevent constipation and drink adequate amounts of liquids. You can stop taking this medication when you are not taking pain medication and you are back on your normal bowel routine.  Risks of Surgery Risks of surgery are low but include bleeding, infection, damage to surrounding structures, re-operation, blood clots, and very rarely death.   Blood Transfusion Information (For the consent to be signed before surgery)  We will be checking your blood type before surgery so in case of emergencies, we will know what type of blood you would need.                                            WHAT IS A BLOOD TRANSFUSION?  A transfusion is the replacement of blood or some of its parts. Blood is made up of multiple cells which provide different functions.  Red blood cells carry oxygen and are used for blood loss replacement.  White blood cells fight against infection.  Platelets control bleeding.  Plasma helps clot blood.  Other blood products are available for specialized needs, such as hemophilia or other clotting disorders. BEFORE THE TRANSFUSION  Who gives blood for transfusions?   You may be able to donate blood to be used at a later date on yourself (autologous  donation).  Relatives can be asked to donate blood. This is generally not any safer than if you have received blood from a stranger. The same precautions are taken to ensure safety when a relative's blood is donated.  Healthy volunteers who are fully evaluated to make sure their blood is safe. This is blood bank blood. Transfusion therapy is the safest it has ever been in the practice of medicine. Before blood is taken from a donor, a complete history is taken to make sure that person has no history of diseases nor engages in risky social behavior (examples are intravenous drug use or sexual activity with multiple partners). The donor's travel history is screened to minimize risk of transmitting infections, such as malaria. The donated blood is tested for signs of infectious diseases, such as HIV and hepatitis. The blood is then tested to be sure it is compatible  with you in order to minimize the chance of a transfusion reaction. If you or a relative donates blood, this is often done in anticipation of surgery and is not appropriate for emergency situations. It takes many days to process the donated blood. RISKS AND COMPLICATIONS Although transfusion therapy is very safe and saves many lives, the main dangers of transfusion include:   Getting an infectious disease.  Developing a transfusion reaction. This is an allergic reaction to something in the blood you were given. Every precaution is taken to prevent this. The decision to have a blood transfusion has been considered carefully by your caregiver before blood is given. Blood is not given unless the benefits outweigh the risks.  AFTER SURGERY INSTRUCTIONS  Return to work: 4 weeks if applicable  Activity: 1. Be up and out of the bed during the day.  Take a nap if needed.  You may walk up steps but be careful and use the hand rail.  Stair climbing will tire you more than you think, you may need to stop part way and rest.   2. No lifting or  straining for 6 weeks over 10 pounds. No pushing, pulling, straining for 6 weeks.  3. No driving for 1 week(s).  Do not drive if you are taking narcotic pain medicine and make sure that your reaction time has returned.   4. You can shower as soon as the next day after surgery. Shower daily.  Use your regular soap and water (not directly on the incision) and pat your incision(s) dry afterwards; don't rub.  No tub baths or submerging your body in water until cleared by your surgeon. If you have the soap that was given to you by pre-surgical testing that was used before surgery, you do not need to use it afterwards because this can irritate your incisions.   5. No sexual activity and nothing in the vagina for 4 weeks.  6. You may experience a small amount of clear drainage from your incisions, which is normal.  If the drainage persists, increases, or changes color please call the office.  7. Do not use creams, lotions, or ointments such as neosporin on your incisions after surgery until advised by your surgeon because they can cause removal of the dermabond glue on your incisions.    8. You may experience vaginal spotting after surgery or around the 6-8 week mark from surgery when the stitches at the top of the vagina begin to dissolve.  The spotting is normal but if you experience heavy bleeding, call our office.  9. Take Tylenol or ibuprofen first for pain and only use narcotic pain medication for severe pain not relieved by the Tylenol or Ibuprofen.  Monitor your Tylenol intake to a max of 4,000 mg in a 24 hour period. You can alternate these medications after surgery.  Diet: 1. Low sodium Heart Healthy Diet is recommended but you are cleared to resume your normal (before surgery) diet after your procedure.  2. It is safe to use a laxative, such as Miralax or Colace, if you have difficulty moving your bowels. You have been prescribed Sennakot at bedtime every evening to keep bowel movements  regular and to prevent constipation.    Wound Care: 1. Keep clean and dry.  Shower daily.  Reasons to call the Doctor:  Fever - Oral temperature greater than 100.4 degrees Fahrenheit  Foul-smelling vaginal discharge  Difficulty urinating  Nausea and vomiting  Increased pain at the site of the incision  that is unrelieved with pain medicine.  Difficulty breathing with or without chest pain  New calf pain especially if only on one side  Sudden, continuing increased vaginal bleeding with or without clots.   Contacts: For questions or concerns you should contact:  Dr. Jeral Pinch at [phone]  Joylene John, NP at [phone]  After Hours: call [phone] and have the GYN Oncologist paged/contacted (after 5 pm or on the weekends)

## 2020-09-26 ENCOUNTER — Encounter: Payer: Self-pay | Admitting: Gynecologic Oncology

## 2020-09-26 ENCOUNTER — Inpatient Hospital Stay: Payer: 59 | Attending: Gynecologic Oncology | Admitting: Gynecologic Oncology

## 2020-09-26 ENCOUNTER — Other Ambulatory Visit: Payer: Self-pay

## 2020-09-26 VITALS — BP 139/67 | HR 82 | Temp 98.0°F | Resp 20 | Ht 68.0 in | Wt 287.0 lb

## 2020-09-26 DIAGNOSIS — E538 Deficiency of other specified B group vitamins: Secondary | ICD-10-CM | POA: Diagnosis not present

## 2020-09-26 DIAGNOSIS — J45909 Unspecified asthma, uncomplicated: Secondary | ICD-10-CM | POA: Insufficient documentation

## 2020-09-26 DIAGNOSIS — E559 Vitamin D deficiency, unspecified: Secondary | ICD-10-CM | POA: Insufficient documentation

## 2020-09-26 DIAGNOSIS — F411 Generalized anxiety disorder: Secondary | ICD-10-CM | POA: Diagnosis not present

## 2020-09-26 DIAGNOSIS — Z79899 Other long term (current) drug therapy: Secondary | ICD-10-CM | POA: Insufficient documentation

## 2020-09-26 DIAGNOSIS — F32A Depression, unspecified: Secondary | ICD-10-CM | POA: Insufficient documentation

## 2020-09-26 DIAGNOSIS — B20 Human immunodeficiency virus [HIV] disease: Secondary | ICD-10-CM | POA: Insufficient documentation

## 2020-09-26 DIAGNOSIS — Z90710 Acquired absence of both cervix and uterus: Secondary | ICD-10-CM | POA: Diagnosis not present

## 2020-09-26 DIAGNOSIS — E669 Obesity, unspecified: Secondary | ICD-10-CM | POA: Diagnosis not present

## 2020-09-26 DIAGNOSIS — R748 Abnormal levels of other serum enzymes: Secondary | ICD-10-CM | POA: Diagnosis not present

## 2020-09-26 DIAGNOSIS — R1900 Intra-abdominal and pelvic swelling, mass and lump, unspecified site: Secondary | ICD-10-CM | POA: Diagnosis not present

## 2020-09-26 DIAGNOSIS — F17210 Nicotine dependence, cigarettes, uncomplicated: Secondary | ICD-10-CM | POA: Insufficient documentation

## 2020-09-26 DIAGNOSIS — E7800 Pure hypercholesterolemia, unspecified: Secondary | ICD-10-CM | POA: Insufficient documentation

## 2020-09-26 DIAGNOSIS — K219 Gastro-esophageal reflux disease without esophagitis: Secondary | ICD-10-CM | POA: Diagnosis not present

## 2020-09-26 MED ORDER — OXYCODONE HCL 5 MG PO TABS: 5.0000 mg | ORAL_TABLET | ORAL | 0 refills | Status: DC | PRN

## 2020-09-26 MED ORDER — SENNOSIDES-DOCUSATE SODIUM 8.6-50 MG PO TABS: 2.0000 | ORAL_TABLET | Freq: Every day | ORAL | 0 refills | Status: DC

## 2020-09-26 MED ORDER — IBUPROFEN 600 MG PO TABS: 600.0000 mg | ORAL_TABLET | Freq: Three times a day (TID) | ORAL | 0 refills | Status: DC | PRN

## 2020-09-29 ENCOUNTER — Other Ambulatory Visit (HOSPITAL_COMMUNITY)
Admission: RE | Admit: 2020-09-29 | Discharge: 2020-09-29 | Disposition: A | Discharge status: Home / Self Care | Payer: 59 | Source: Ambulatory Visit | Attending: Gynecologic Oncology | Admitting: Gynecologic Oncology

## 2020-09-29 DIAGNOSIS — Z20822 Contact with and (suspected) exposure to covid-19: Secondary | ICD-10-CM | POA: Diagnosis not present

## 2020-09-29 DIAGNOSIS — Z01812 Encounter for preprocedural laboratory examination: Secondary | ICD-10-CM | POA: Diagnosis not present

## 2020-09-29 LAB — SARS CORONAVIRUS 2 (TAT 6-24 HRS): SARS Coronavirus 2: NEGATIVE

## 2020-09-30 ENCOUNTER — Telehealth: Payer: Self-pay | Admitting: *Deleted

## 2020-09-30 NOTE — Patient Instructions (Addendum)
DUE TO COVID-19 ONLY ONE VISITOR IS ALLOWED TO COME WITH YOU AND STAY IN THE WAITING ROOM ONLY DURING PRE OP AND PROCEDURE DAY OF SURGERY. THE 1 VISITOR  MAY VISIT WITH YOU AFTER SURGERY IN YOUR PRIVATE ROOM DURING VISITING HOURS ONLY!  YOU NEED TO HAVE A COVID 19 TEST ON: 09/29/20, THIS TEST MUST BE DONE BEFORE SURGERY,  COVID TESTING SITE [address] [place] [id number], IT IS ON THE RIGHT GOING OUT [address] APPROXIMATELY  2 MINUTES PAST [employer] ON THE RIGHT. ONCE YOUR COVID TEST IS COMPLETED,  PLEASE BEGIN THE QUARANTINE INSTRUCTIONS AS OUTLINED IN YOUR HANDOUT.                Julie Fox   Your procedure is scheduled on: 10/02/20   Report to [facility] Main  Entrance   Report to admitting at: 11:00 AM     Call this number if you have problems the morning of surgery [phone]    Remember:   Eat a light diet the day before surgery.  Examples including soups, broths, toast, yogurt, mashed potatoes.  Things to avoid include carbonated beverages (fizzy beverages), raw fruits and raw vegetables, or beans.   If your bowels are filled with gas, your surgeon will have difficulty visualizing your pelvic organs which increases your surgical risks. Do not eat solid food :After Midnight. Clear liquids from midnight until: 10:00 am.  CLEAR LIQUID DIET   Foods Allowed                                                                     Foods Excluded  Coffee and tea, regular and decaf                             liquids that you cannot  Plain Jell-O any favor except red or purple                                           see through such as: Fruit ices (not with fruit pulp)                                     milk, soups, orange juice  Iced Popsicles                                    All solid food Carbonated beverages, regular and diet                                    Cranberry, grape and apple juices Sports drinks like Gatorade Lightly  seasoned clear broth or consume(fat free) Sugar, honey syrup  Sample Menu Breakfast  Lunch                                     Supper Cranberry juice                    Beef broth                            Chicken broth Jell-O                                     Grape juice                           Apple juice Coffee or tea                        Jell-O                                      Popsicle                                                Coffee or tea                        Coffee or tea  _____________________________________________________________________  BRUSH YOUR TEETH MORNING OF SURGERY AND RINSE YOUR MOUTH OUT, NO CHEWING GUM CANDY OR MINTS.    Take these medicines the morning of surgery with A SIP OF WATER: Buspar,paxil,omeprazole,genvoya.                               You may not have any metal on your body including hair pins and              piercings  Do not wear jewelry, make-up, lotions, powders or perfumes, deodorant             Do not wear nail polish on your fingernails.  Do not shave  48 hours prior to surgery.    Do not bring valuables to the hospital. Pax.  Contacts, dentures or bridgework may not be worn into surgery.  Leave suitcase in the car. After surgery it may be brought to your room.     Patients discharged the day of surgery will not be allowed to drive home. IF YOU ARE HAVING SURGERY AND GOING HOME THE SAME DAY, YOU MUST HAVE AN ADULT TO DRIVE YOU HOME AND BE WITH YOU FOR 24 HOURS. YOU MAY GO HOME BY TAXI OR UBER OR ORTHERWISE, BUT AN ADULT MUST ACCOMPANY YOU HOME AND STAY WITH YOU FOR 24 HOURS.  Name and phone number of your driver:  Special Instructions: N/A              Please read over the following fact sheets you were given: _____________________________________________________________________   - Preparing for Surgery Before surgery,  you can play an important role.  Because skin is not sterile, your skin needs to be as free of germs as possible.  You can reduce the number of germs on your skin by washing with CHG (chlorahexidine gluconate) soap before surgery.  CHG is an antiseptic cleaner which kills germs and bonds with the skin to continue killing germs even after washing. Please DO NOT use if you have an allergy to CHG or antibacterial soaps.  If your skin becomes reddened/irritated stop using the CHG and inform your nurse when you arrive at Short Stay. Do not shave (including legs and underarms) for at least 48 hours prior to the first CHG shower.  You may shave your face/neck. Please follow these instructions carefully:  1.  Shower with CHG Soap the night before surgery and the  morning of Surgery.  2.  If you choose to wash your hair, wash your hair first as usual with your  normal  shampoo.  3.  After you shampoo, rinse your hair and body thoroughly to remove the  shampoo.                           4.  Use CHG as you would any other liquid soap.  You can apply chg directly  to the skin and wash                       Gently with a scrungie or clean washcloth.  5.  Apply the CHG Soap to your body ONLY FROM THE NECK DOWN.   Do not use on face/ open                           Wound or open sores. Avoid contact with eyes, ears mouth and genitals (private parts).                       Wash face,  Genitals (private parts) with your normal soap.             6.  Wash thoroughly, paying special attention to the area where your surgery  will be performed.  7.  Thoroughly rinse your body with warm water from the neck down.  8.  DO NOT shower/wash with your normal soap after using and rinsing off  the CHG Soap.                9.  Pat yourself dry with a clean towel.            10.  Wear clean pajamas.            11.  Place clean sheets on your bed the night of your first shower and do not  sleep with pets. Day of Surgery : Do not  apply any lotions/deodorants the morning of surgery.  Please wear clean clothes to the hospital/surgery center.  FAILURE TO FOLLOW THESE INSTRUCTIONS MAY RESULT IN THE CANCELLATION OF YOUR SURGERY PATIENT SIGNATURE_________________________________  NURSE SIGNATURE__________________________________  ________________________________________________________________________

## 2020-09-30 NOTE — Telephone Encounter (Signed)
Faxed FMLA paperwork. 

## 2020-10-01 ENCOUNTER — Encounter (HOSPITAL_COMMUNITY)
Admission: RE | Admit: 2020-10-01 | Discharge: 2020-10-01 | Disposition: A | Discharge status: Home / Self Care | Payer: 59 | Source: Ambulatory Visit | Attending: Gynecologic Oncology | Admitting: Gynecologic Oncology

## 2020-10-01 ENCOUNTER — Encounter (HOSPITAL_COMMUNITY): Payer: Self-pay

## 2020-10-01 ENCOUNTER — Telehealth: Payer: Self-pay

## 2020-10-01 ENCOUNTER — Other Ambulatory Visit: Payer: Self-pay

## 2020-10-01 DIAGNOSIS — Z01812 Encounter for preprocedural laboratory examination: Secondary | ICD-10-CM | POA: Insufficient documentation

## 2020-10-01 HISTORY — DX: Pneumonia, unspecified organism: J18.9

## 2020-10-01 HISTORY — DX: Chronic kidney disease, unspecified: N18.9

## 2020-10-01 LAB — COMPREHENSIVE METABOLIC PANEL
ALT: 125 U/L — ABNORMAL HIGH (ref 0–44)
AST: 87 U/L — ABNORMAL HIGH (ref 15–41)
Albumin: 4.2 g/dL (ref 3.5–5.0)
Alkaline Phosphatase: 93 U/L (ref 38–126)
Anion gap: 10 (ref 5–15)
BUN: 12 mg/dL (ref 6–20)
CO2: 25 mmol/L (ref 22–32)
Calcium: 9.8 mg/dL (ref 8.9–10.3)
Chloride: 100 mmol/L (ref 98–111)
Creatinine, Ser: 0.88 mg/dL (ref 0.44–1.00)
GFR, Estimated: >60 mL/min (ref >60)
Glucose, Bld: 114 mg/dL — ABNORMAL HIGH (ref 70–99)
Potassium: 4.4 mmol/L (ref 3.5–5.1)
Sodium: 135 mmol/L (ref 135–145)
Total Bilirubin: 0.5 mg/dL (ref 0.3–1.2)
Total Protein: 7.6 g/dL (ref 6.5–8.1)

## 2020-10-01 LAB — CBC
HCT: 42.5 % (ref 36.0–46.0)
Hemoglobin: 14.2 g/dL (ref 12.0–15.0)
MCH: 32.3 pg (ref 26.0–34.0)
MCHC: 33.4 g/dL (ref 30.0–36.0)
MCV: 96.6 fL (ref 80.0–100.0)
Platelets: 321 10*3/uL (ref 150–400)
RBC: 4.4 MIL/uL (ref 3.87–5.11)
RDW: 12.9 % (ref 11.5–15.5)
WBC: 7.5 10*3/uL (ref 4.0–10.5)
nRBC: 0 % (ref 0.0–0.2)

## 2020-10-01 LAB — TYPE AND SCREEN
ABO/RH(D): O POS
Antibody Screen: NEGATIVE

## 2020-10-01 NOTE — Progress Notes (Signed)
COVID Vaccine Completed: Yes Date COVID Vaccine completed: 08/23/20 [place] COVID vaccine manufacturer: [place]    PCP - NO PCP Cardiologist -   Chest x-ray -  EKG - 12/05/19 EPIC Stress Test -  ECHO -  Cardiac Cath -  Pacemaker/ICD device last checked:  Sleep Study -  CPAP -   Fasting Blood Sugar -  Checks Blood Sugar _____ times a day  Blood Thinner Instructions: Aspirin Instructions: Last Dose:  Anesthesia review: BP 159/101,148/103. Konrad Felix PA,was notified.  Patient denies shortness of breath, fever, cough and chest pain at PAT appointment   Patient verbalized understanding of instructions that were given to them at the PAT appointment. Patient was also instructed that they will need to review over the PAT instructions again at home before surgery.

## 2020-10-01 NOTE — Telephone Encounter (Signed)
Requested that Angela Nevin put a note on patient's pre-op chart to obtain a stat PT/INR tomorrow at short stay per Joylene John, NP.  Orders are under Sign and Held. Carla verbalized understanding.

## 2020-10-01 NOTE — Telephone Encounter (Signed)
LM for Julie Fox to call the office at [phone] if she has any questions regarding the written pre-op instructions given to her this am.. If she has any questions or concerns in the am, She can call the short Say number on her instructions [phone].

## 2020-10-02 ENCOUNTER — Telehealth (HOSPITAL_COMMUNITY): Payer: Self-pay | Admitting: *Deleted

## 2020-10-02 ENCOUNTER — Ambulatory Visit (HOSPITAL_COMMUNITY): Payer: 59 | Admitting: Physician Assistant

## 2020-10-02 ENCOUNTER — Encounter (HOSPITAL_COMMUNITY): Payer: Self-pay | Admitting: Gynecologic Oncology

## 2020-10-02 ENCOUNTER — Encounter (HOSPITAL_COMMUNITY)
Admission: RE | Disposition: A | Discharge status: Home / Self Care | Payer: Self-pay | Source: Home / Self Care | Attending: Gynecologic Oncology

## 2020-10-02 ENCOUNTER — Ambulatory Visit (HOSPITAL_COMMUNITY)
Admission: RE | Admit: 2020-10-02 | Discharge: 2020-10-02 | Disposition: A | Discharge status: Home / Self Care | Payer: 59 | Attending: Gynecologic Oncology | Admitting: Gynecologic Oncology

## 2020-10-02 DIAGNOSIS — D271 Benign neoplasm of left ovary: Secondary | ICD-10-CM | POA: Diagnosis not present

## 2020-10-02 DIAGNOSIS — R1900 Intra-abdominal and pelvic swelling, mass and lump, unspecified site: Secondary | ICD-10-CM | POA: Diagnosis not present

## 2020-10-02 DIAGNOSIS — B20 Human immunodeficiency virus [HIV] disease: Secondary | ICD-10-CM | POA: Insufficient documentation

## 2020-10-02 DIAGNOSIS — Z79899 Other long term (current) drug therapy: Secondary | ICD-10-CM | POA: Insufficient documentation

## 2020-10-02 DIAGNOSIS — E669 Obesity, unspecified: Secondary | ICD-10-CM | POA: Insufficient documentation

## 2020-10-02 DIAGNOSIS — Z6841 Body Mass Index (BMI) 40.0 and over, adult: Secondary | ICD-10-CM | POA: Insufficient documentation

## 2020-10-02 DIAGNOSIS — K7581 Nonalcoholic steatohepatitis (NASH): Secondary | ICD-10-CM | POA: Insufficient documentation

## 2020-10-02 DIAGNOSIS — F17210 Nicotine dependence, cigarettes, uncomplicated: Secondary | ICD-10-CM | POA: Diagnosis not present

## 2020-10-02 HISTORY — PX: ROBOTIC ASSISTED SALPINGO OOPHERECTOMY: SHX6082

## 2020-10-02 LAB — PROTIME-INR
INR: 1 (ref 0.8–1.2)
Prothrombin Time: 12.7 seconds (ref 11.4–15.2)

## 2020-10-02 SURGERY — SALPINGO-OOPHORECTOMY, ROBOT-ASSISTED
Anesthesia: General

## 2020-10-02 MED ORDER — SODIUM CHLORIDE 0.9 % IV SOLN: 250.0000 mL | INTRAVENOUS | Status: DC | PRN

## 2020-10-02 MED ORDER — LACTATED RINGERS IV SOLN: INTRAVENOUS | Status: DC

## 2020-10-02 MED ORDER — ALBUTEROL SULFATE HFA 108 (90 BASE) MCG/ACT IN AERS
INHALATION_SPRAY | RESPIRATORY_TRACT | Status: DC | PRN
  Administered 2020-10-02 (×2): 2 via RESPIRATORY_TRACT

## 2020-10-02 MED ORDER — PROMETHAZINE HCL 25 MG/ML IJ SOLN
INTRAMUSCULAR | Status: AC
  Filled 2020-10-02: qty 1

## 2020-10-02 MED ORDER — DEXAMETHASONE SODIUM PHOSPHATE 4 MG/ML IJ SOLN: 4.0000 mg | INTRAMUSCULAR | Status: DC

## 2020-10-02 MED ORDER — FENTANYL CITRATE (PF) 250 MCG/5ML IJ SOLN
INTRAMUSCULAR | Status: DC | PRN
  Administered 2020-10-02: 50 ug via INTRAVENOUS
  Administered 2020-10-02: 100 ug via INTRAVENOUS

## 2020-10-02 MED ORDER — ORAL CARE MOUTH RINSE: 15.0000 mL | Freq: Once | OROMUCOSAL | Status: AC

## 2020-10-02 MED ORDER — DEXAMETHASONE SODIUM PHOSPHATE 10 MG/ML IJ SOLN
INTRAMUSCULAR | Status: DC | PRN
  Administered 2020-10-02: 10 mg via INTRAVENOUS

## 2020-10-02 MED ORDER — LACTATED RINGERS IR SOLN
Status: DC | PRN
  Administered 2020-10-02: 1000 mL

## 2020-10-02 MED ORDER — OXYCODONE HCL 5 MG PO TABS
ORAL_TABLET | ORAL | Status: AC
  Filled 2020-10-02: qty 1

## 2020-10-02 MED ORDER — KETAMINE HCL 10 MG/ML IJ SOLN
INTRAMUSCULAR | Status: DC | PRN
  Administered 2020-10-02: 30 mg via INTRAVENOUS

## 2020-10-02 MED ORDER — MIDAZOLAM HCL 2 MG/2ML IJ SOLN: 0.5000 mg | Freq: Once | INTRAMUSCULAR | Status: DC | PRN

## 2020-10-02 MED ORDER — LIDOCAINE 2% (20 MG/ML) 5 ML SYRINGE
INTRAMUSCULAR | Status: DC | PRN
  Administered 2020-10-02: 50 mg via INTRAVENOUS

## 2020-10-02 MED ORDER — ACETAMINOPHEN 500 MG PO TABS: 1000.0000 mg | ORAL_TABLET | Freq: Once | ORAL | Status: DC

## 2020-10-02 MED ORDER — HYDROMORPHONE HCL 1 MG/ML IJ SOLN
INTRAMUSCULAR | Status: AC
  Filled 2020-10-02: qty 1

## 2020-10-02 MED ORDER — ACETAMINOPHEN 325 MG PO TABS: 650.0000 mg | ORAL_TABLET | ORAL | Status: DC | PRN

## 2020-10-02 MED ORDER — ONDANSETRON HCL 4 MG/2ML IJ SOLN
INTRAMUSCULAR | Status: DC | PRN
  Administered 2020-10-02: 4 mg via INTRAVENOUS

## 2020-10-02 MED ORDER — CHLORHEXIDINE GLUCONATE 0.12 % MT SOLN
15.0000 mL | Freq: Once | OROMUCOSAL | Status: AC
  Administered 2020-10-02: 15 mL via OROMUCOSAL

## 2020-10-02 MED ORDER — PROPOFOL 10 MG/ML IV BOLUS
INTRAVENOUS | Status: DC | PRN
  Administered 2020-10-02: 200 mg via INTRAVENOUS
  Administered 2020-10-02: 50 mg via INTRAVENOUS

## 2020-10-02 MED ORDER — OXYCODONE HCL 5 MG PO TABS
5.0000 mg | ORAL_TABLET | Freq: Once | ORAL | Status: AC | PRN
  Administered 2020-10-02: 5 mg via ORAL

## 2020-10-02 MED ORDER — ACETAMINOPHEN 500 MG PO TABS
1000.0000 mg | ORAL_TABLET | ORAL | Status: AC
  Administered 2020-10-02: 1000 mg via ORAL
  Filled 2020-10-02: qty 2

## 2020-10-02 MED ORDER — BUPIVACAINE HCL 0.25 % IJ SOLN
INTRAMUSCULAR | Status: DC | PRN
  Administered 2020-10-02: 34 mL

## 2020-10-02 MED ORDER — MORPHINE SULFATE (PF) 4 MG/ML IV SOLN: 2.0000 mg | INTRAVENOUS | Status: DC | PRN

## 2020-10-02 MED ORDER — MEPERIDINE HCL 50 MG/ML IJ SOLN: 6.2500 mg | INTRAMUSCULAR | Status: DC | PRN

## 2020-10-02 MED ORDER — SODIUM CHLORIDE 0.9% FLUSH: 3.0000 mL | INTRAVENOUS | Status: DC | PRN

## 2020-10-02 MED ORDER — BUPIVACAINE HCL 0.25 % IJ SOLN
INTRAMUSCULAR | Status: AC
  Filled 2020-10-02: qty 1

## 2020-10-02 MED ORDER — ROCURONIUM BROMIDE 10 MG/ML (PF) SYRINGE
PREFILLED_SYRINGE | INTRAVENOUS | Status: DC | PRN
  Administered 2020-10-02: 70 mg via INTRAVENOUS
  Administered 2020-10-02: 10 mg via INTRAVENOUS

## 2020-10-02 MED ORDER — LIDOCAINE HCL (PF) 2 % IJ SOLN
INTRAMUSCULAR | Status: DC | PRN
  Administered 2020-10-02: 1 mg/kg/h via INTRADERMAL

## 2020-10-02 MED ORDER — SODIUM CHLORIDE 0.9% FLUSH: 3.0000 mL | Freq: Two times a day (BID) | INTRAVENOUS | Status: DC

## 2020-10-02 MED ORDER — HEPARIN SODIUM (PORCINE) 5000 UNIT/ML IJ SOLN
5000.0000 [IU] | INTRAMUSCULAR | Status: AC
  Administered 2020-10-02: 5000 [IU] via SUBCUTANEOUS
  Filled 2020-10-02: qty 1

## 2020-10-02 MED ORDER — SCOPOLAMINE 1 MG/3DAYS TD PT72
1.0000 | MEDICATED_PATCH | TRANSDERMAL | Status: DC
  Administered 2020-10-02: 1.5 mg via TRANSDERMAL
  Filled 2020-10-02: qty 1

## 2020-10-02 MED ORDER — ACETAMINOPHEN 650 MG RE SUPP
650.0000 mg | RECTAL | Status: DC | PRN
  Filled 2020-10-02: qty 1

## 2020-10-02 MED ORDER — OXYCODONE HCL 5 MG/5ML PO SOLN: 5.0000 mg | Freq: Once | ORAL | Status: AC | PRN

## 2020-10-02 MED ORDER — HYDROMORPHONE HCL 1 MG/ML IJ SOLN
0.2500 mg | INTRAMUSCULAR | Status: DC | PRN
  Administered 2020-10-02 (×2): 0.5 mg via INTRAVENOUS

## 2020-10-02 MED ORDER — FENTANYL CITRATE (PF) 100 MCG/2ML IJ SOLN
INTRAMUSCULAR | Status: AC
  Filled 2020-10-02: qty 2

## 2020-10-02 MED ORDER — STERILE WATER FOR IRRIGATION IR SOLN
Status: DC | PRN
  Administered 2020-10-02: 1000 mL

## 2020-10-02 MED ORDER — OXYCODONE HCL 5 MG PO TABS: 5.0000 mg | ORAL_TABLET | ORAL | Status: DC | PRN

## 2020-10-02 MED ORDER — SUGAMMADEX SODIUM 200 MG/2ML IV SOLN
INTRAVENOUS | Status: DC | PRN
  Administered 2020-10-02: 350 mg via INTRAVENOUS

## 2020-10-02 MED ORDER — PROPOFOL 10 MG/ML IV BOLUS
INTRAVENOUS | Status: AC
  Filled 2020-10-02: qty 20

## 2020-10-02 MED ORDER — PROMETHAZINE HCL 25 MG/ML IJ SOLN
6.2500 mg | INTRAMUSCULAR | Status: DC | PRN
  Administered 2020-10-02: 12.5 mg via INTRAVENOUS

## 2020-10-02 MED ORDER — KETOROLAC TROMETHAMINE 15 MG/ML IJ SOLN: 15.0000 mg | Freq: Once | INTRAMUSCULAR | Status: DC | PRN

## 2020-10-02 MED ORDER — MIDAZOLAM HCL 2 MG/2ML IJ SOLN
INTRAMUSCULAR | Status: DC | PRN
  Administered 2020-10-02: 2 mg via INTRAVENOUS

## 2020-10-02 MED ORDER — GABAPENTIN 300 MG PO CAPS
300.0000 mg | ORAL_CAPSULE | ORAL | Status: AC
  Administered 2020-10-02: 300 mg via ORAL
  Filled 2020-10-02: qty 1

## 2020-10-02 SURGICAL SUPPLY — 73 items
ADH SKN CLS APL DERMABOND .7 (GAUZE/BANDAGES/DRESSINGS) ×1
AGENT HMST KT MTR STRL THRMB (HEMOSTASIS)
APL ESCP 34 STRL LF DISP (HEMOSTASIS)
APPLICATOR SURGIFLO ENDO (HEMOSTASIS) IMPLANT
BACTOSHIELD CHG 4% 4OZ (MISCELLANEOUS) ×1
BAG LAPAROSCOPIC 12 15 PORT 16 (BASKET) IMPLANT
BAG RETRIEVAL 12/15 (BASKET) ×2
BAG SPEC RTRVL 10 TROC 200 (ENDOMECHANICALS) ×1
BAG SPEC RTRVL LRG 6X4 10 (ENDOMECHANICALS)
BLADE SURG SZ10 CARB STEEL (BLADE) IMPLANT
COVER BACK TABLE 60X90IN (DRAPES) ×2 IMPLANT
COVER TIP SHEARS 8 DVNC (MISCELLANEOUS) ×1 IMPLANT
COVER TIP SHEARS 8MM DA VINCI (MISCELLANEOUS) ×2
COVER WAND RF STERILE (DRAPES) IMPLANT
DECANTER SPIKE VIAL GLASS SM (MISCELLANEOUS) IMPLANT
DERMABOND ADVANCED (GAUZE/BANDAGES/DRESSINGS) ×1
DERMABOND ADVANCED .7 DNX12 (GAUZE/BANDAGES/DRESSINGS) ×1 IMPLANT
DRAPE ARM DVNC X/XI (DISPOSABLE) ×4 IMPLANT
DRAPE COLUMN DVNC XI (DISPOSABLE) ×1 IMPLANT
DRAPE DA VINCI XI ARM (DISPOSABLE) ×8
DRAPE DA VINCI XI COLUMN (DISPOSABLE) ×2
DRAPE SHEET LG 3/4 BI-LAMINATE (DRAPES) ×2 IMPLANT
DRAPE SURG IRRIG POUCH 19X23 (DRAPES) ×2 IMPLANT
DRSG OPSITE POSTOP 4X6 (GAUZE/BANDAGES/DRESSINGS) IMPLANT
DRSG OPSITE POSTOP 4X8 (GAUZE/BANDAGES/DRESSINGS) IMPLANT
ELECT REM PT RETURN 15FT ADLT (MISCELLANEOUS) ×2 IMPLANT
GLOVE BIO SURGEON STRL SZ 6 (GLOVE) ×8 IMPLANT
GLOVE BIO SURGEON STRL SZ 6.5 (GLOVE) ×4 IMPLANT
GOWN STRL REUS W/ TWL LRG LVL3 (GOWN DISPOSABLE) ×4 IMPLANT
GOWN STRL REUS W/TWL LRG LVL3 (GOWN DISPOSABLE) ×8
HOLDER FOLEY CATH W/STRAP (MISCELLANEOUS) ×2 IMPLANT
IRRIG SUCT STRYKERFLOW 2 WTIP (MISCELLANEOUS) ×2
IRRIGATION SUCT STRKRFLW 2 WTP (MISCELLANEOUS) ×1 IMPLANT
KIT PROCEDURE DA VINCI SI (MISCELLANEOUS)
KIT PROCEDURE DVNC SI (MISCELLANEOUS) IMPLANT
KIT TURNOVER KIT A (KITS) IMPLANT
MANIPULATOR UTERINE 4.5 ZUMI (MISCELLANEOUS) ×2 IMPLANT
NDL HYPO 21X1.5 SAFETY (NEEDLE) ×1 IMPLANT
NDL SPNL 18GX3.5 QUINCKE PK (NEEDLE) IMPLANT
NEEDLE HYPO 21X1.5 SAFETY (NEEDLE) ×2 IMPLANT
NEEDLE SPNL 18GX3.5 QUINCKE PK (NEEDLE) IMPLANT
OBTURATOR OPTICAL STANDARD 8MM (TROCAR) ×2
OBTURATOR OPTICAL STND 8 DVNC (TROCAR) ×1
OBTURATOR OPTICALSTD 8 DVNC (TROCAR) ×1 IMPLANT
PACK ROBOT GYN CUSTOM WL (TRAY / TRAY PROCEDURE) ×2 IMPLANT
PAD POSITIONING PINK XL (MISCELLANEOUS) ×2 IMPLANT
PENCIL SMOKE EVACUATOR (MISCELLANEOUS) IMPLANT
PORT ACCESS TROCAR AIRSEAL 12 (TROCAR) ×1 IMPLANT
PORT ACCESS TROCAR AIRSEAL 5M (TROCAR) ×1
POUCH RETRIEVAL ECOSAC 10 (ENDOMECHANICALS) IMPLANT
POUCH RETRIEVAL ECOSAC 10MM (ENDOMECHANICALS) ×2
POUCH SPECIMEN RETRIEVAL 10MM (ENDOMECHANICALS) IMPLANT
SCISSORS LAP 5X45 EPIX DISP (ENDOMECHANICALS) ×1 IMPLANT
SCRUB CHG 4% DYNA-HEX 4OZ (MISCELLANEOUS) ×1 IMPLANT
SEAL CANN UNIV 5-8 DVNC XI (MISCELLANEOUS) ×4 IMPLANT
SEAL XI 5MM-8MM UNIVERSAL (MISCELLANEOUS) ×8
SET TRI-LUMEN FLTR TB AIRSEAL (TUBING) ×2 IMPLANT
SPONGE LAP 18X18 RF (DISPOSABLE) IMPLANT
SURGIFLO W/THROMBIN 8M KIT (HEMOSTASIS) IMPLANT
SUT MNCRL AB 4-0 PS2 18 (SUTURE) IMPLANT
SUT PDS AB 1 TP1 96 (SUTURE) IMPLANT
SUT VIC AB 0 CT1 27 (SUTURE)
SUT VIC AB 0 CT1 27XBRD ANTBC (SUTURE) IMPLANT
SUT VIC AB 2-0 CT1 27 (SUTURE)
SUT VIC AB 2-0 CT1 TAPERPNT 27 (SUTURE) IMPLANT
SUT VIC AB 4-0 PS2 18 (SUTURE) ×4 IMPLANT
SYR 10ML LL (SYRINGE) IMPLANT
TOWEL OR NON WOVEN STRL DISP B (DISPOSABLE) ×2 IMPLANT
TRAP SPECIMEN MUCUS 40CC (MISCELLANEOUS) IMPLANT
TRAY FOLEY MTR SLVR 16FR STAT (SET/KITS/TRAYS/PACK) ×2 IMPLANT
TROCAR XCEL NON-BLD 5MMX100MML (ENDOMECHANICALS) IMPLANT
UNDERPAD 30X36 HEAVY ABSORB (UNDERPADS AND DIAPERS) ×2 IMPLANT
WATER STERILE IRR 1000ML POUR (IV SOLUTION) ×2 IMPLANT

## 2020-10-02 NOTE — Interval H&P Note (Signed)
History and Physical Interval Note:  10/02/2020 12:03 PM  Julie Fox  has presented today for surgery, with the diagnosis of COMPLEX PELVIC MASS.  The various methods of treatment have been discussed with the patient and family. After consideration of risks, benefits and other options for treatment, the patient has consented to  Procedure(s): XI ROBOTIC ASSISTED UNILATERAL SALPINGO OOPHORECTOMY,POSSIBLE BILATERAL OOPHORECTOMY AND POSSIBLE STAGING AND POSSIBLE LAPAROTOMY (N/A) as a surgical intervention.  The patient's history has been reviewed, patient examined, no change in status, stable for surgery.  I have reviewed the patient's chart and labs.  Questions were answered to the patient's satisfaction.     Lafonda Mosses

## 2020-10-02 NOTE — Anesthesia Preprocedure Evaluation (Addendum)
Anesthesia Evaluation  Patient identified by MRN, date of birth, ID band Patient awake    Reviewed: Allergy & Precautions, NPO status , Patient's Chart, lab work & pertinent test results  History of Anesthesia Complications Negative for: history of anesthetic complications  Airway Mallampati: I  TM Distance: >3 FB Neck ROM: Full    Dental  (+) Dental Advisory Given, Caps   Pulmonary sleep apnea (does not use CPAP) , Current Smoker and Patient abstained from smoking.,  09/29/2020 SARS coronavirus NEG   breath sounds clear to auscultation       Cardiovascular negative cardio ROS   Rhythm:Regular Rate:Normal     Neuro/Psych  Headaches, Seizures -, Well Controlled,     GI/Hepatic GERD  Controlled and Medicated,Elevated LFTs   Endo/Other  Morbid obesity  Renal/GU Renal InsufficiencyRenal disease     Musculoskeletal   Abdominal (+) + obese,   Peds  Hematology negative hematology ROS (+) HIV,   Anesthesia Other Findings   Reproductive/Obstetrics                            Anesthesia Physical Anesthesia Plan  ASA: III  Anesthesia Plan: General   Post-op Pain Management:    Induction: Intravenous  PONV Risk Score and Plan: 2 and Ondansetron and Dexamethasone  Airway Management Planned: Oral ETT  Additional Equipment: None  Intra-op Plan:   Post-operative Plan: Extubation in OR  Informed Consent: I have reviewed the patients History and Physical, chart, labs and discussed the procedure including the risks, benefits and alternatives for the proposed anesthesia with the patient or authorized representative who has indicated his/her understanding and acceptance.     Dental advisory given  Plan Discussed with: CRNA and Surgeon  Anesthesia Plan Comments:        Anesthesia Quick Evaluation

## 2020-10-02 NOTE — Op Note (Signed)
OPERATIVE NOTE  Pre-operative Diagnosis: Complex adnexal mass, normal tumor markers, transaminitis with abnormal imaging of the liver  Post-operative Diagnosis: same, benign cyst and fallopian tube are frozen section  Operation: Robotic-assisted laparoscopic bilateral salpingoophorectomy, left ureterolysis; liver biopsy (w. Dr. Thermon Leyland)  Surgeon: Jeral Pinch MD  Assistant Surgeon: Lahoma Crocker MD (an MD assistant was necessary for tissue manipulation, management of robotic instrumentation, retraction and positioning due to the complexity of the case and hospital policies).   Anesthesia: GET  Urine Output: 400cc  Operative Findings: On EUA, somewhat mobile, smooth mass filling the cul de sac. On intra-abdominal entry, normal appearing upper abdominal survey. Omentum adherent to the anterior abdominal wall in the lower-mid abdomen, mostly filmy. Left adnexa with an 8-10cm smooth mass. Clear fluid on contained drainage. Left adnexa in very close proximity to the left ureter requiring ureterolysis for safe removal of the ovary. Right adnexa mildly enlarged but normal appearing.. 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 Uterus and cervix surgically absent. Hemostasis of liver biopsy site assured at completion of surgery.  Estimated Blood Loss:  50cc    Total IV Fluids: see I&O flowsheet         Specimens: bilateral tubes and ovaries, washings, liver biopsy         Complications:  None apparent; patient tolerated the procedure well.         Disposition: PACU - hemodynamically stable.  Procedure Details  The patient was seen in the Holding Room. The risks, benefits, complications, treatment options, and expected outcomes were discussed with the patient.  The patient concurred with the proposed plan, giving informed consent.  The site of surgery properly noted/marked. The patient was identified as Julie Fox and  the procedure verified as a Robotic-assisted bilateral salpingo oophorectomy, possible staging, possible lap, liver biopsy.  The patient was counseled on risks associated with removal of the contralateral ovary if pathology does not show borderline tumor or malignancy. She accepts these risks and given her comorbidities, I think it is reasonable to proceed with BSO. She will likely benefit from HRT given her age.  After induction of anesthesia, the patient was draped and prepped in the usual sterile manner. Patient was placed in supine position after anesthesia and draped and prepped in the usual sterile manner as follows: Her arms were tucked to her side with all appropriate precautions.  The shoulders were stabilized with padded shoulder blocks applied to the acromium processes.  The patient was placed in the semi-lithotomy position in [place].  The perineum and vagina were prepped with CholoraPrep. The patient was draped after the CholoraPrep had been allowed to dry for 3 minutes.  A Time Out was held and the above information confirmed.  The urethra was prepped with Betadine. Foley catheter was placed. OG tube placement was confirmed and to suction.   Next, a 10 mm skin incision was made 1 cm below the subcostal margin in the midclavicular line.  The 5 mm Optiview port and scope was used for direct entry.  Opening pressure was under 10 mm CO2.  The abdomen was insufflated and the findings were noted as above.   At this point and all points during the procedure, the patient's intra-abdominal pressure did not exceed 15 mmHg. Next, an 8 mm skin incision was made superior the umbilicus and a right and left port were placed about 8 cm lateral to the robot port on the right and left  side.  A fourth arm was placed on the right.  The 5 mm assist trocar was exchanged for a 10-12 mm port. All ports were placed under direct visualization.    Liver biopsy was performed (see additional op note).  The  patient was placed in steep Trendelenburg.  Sharp dissection and electrocautery were used to lyse the omental adhesions to the anterior abdominal wall. The robot was docked in the normal manner.  The left peritoneum were opened parallel to the IP ligament to open the retroperitoneal space. The IP ligament was skeletonized. The ureter was noted to be on the medial leaf of the broad ligament in close proximity to the inferior aspect of the mass. Ureterolysis was performed, freeing the ureter from the medial broad ligament.  The peritoneum above the ureter was incised and stretched and the infundibulopelvic ligament was skeletonized, cauterized and cut.  A combination of electrocautery and sharp dissection was then used to free remaining filmy attachments of the adnexa to the pelvic sidewall. Ultimately the adnexa was freed from its attachment to the sidewall and remnant round ligament. The specimen was placed in an endocatch bag and drained in a contained manner afte rthe bag was brought to the assist trocar. The adnexa was removed, contained, and sent to pathology.  Attention was then turned to the right. The right peritoneum were opened parallel to the IP ligament to open the retroperitoneal space. The IP ligament was skeletonized. The ureter was noted to be on the medial leaf of the broad ligament.  The peritoneum above the ureter was incised and stretched and the infundibulopelvic ligament was skeletonized, cauterized and cut. Electrocautery was used to cauterize the remaining attachments of the adnexa to the sidewall and remnant round ligament. Once free, the adnexa was placed in an Endocatch bag and removed through the assist trocar.  Irrigation was used and hemostasis was achieved. The surfaces within the left retroperitoneum were somewhat oozing despite no obvious bleeding vessel. Hemoblast was placed within the retroperitoneum and pressure held for 3 minutes. Good hemostasis noted after. At this point  in the procedure was completed.  Robotic instruments were removed under direct visulaization.  The robot was undocked. The fascia at the 10-12 mm port was closed with 0 Vicryl on a UR-5 needle.  The subcuticular tissue was closed with 4-0 Vicryl and the skin was closed with 4-0 Monocryl in a subcuticular manner.  Dermabond was applied.    Foley catheter was removed. All sponge, lap and needle counts were correct x  3.   The patient was transferred to the recovery room in stable condition.  Jeral Pinch, MD

## 2020-10-02 NOTE — Anesthesia Postprocedure Evaluation (Signed)
Anesthesia Post Note  Patient: NORAA PICKERAL  Procedure(s) Performed: XI ROBOTIC ASSISTED  BILATERAL SALPING-OOPHORECTOMY, LIVER BX (N/A )     Patient location during evaluation: PACU Anesthesia Type: General Level of consciousness: awake and alert, patient cooperative and oriented Pain management: pain level controlled Vital Signs Assessment: post-procedure vital signs reviewed and stable Respiratory status: spontaneous breathing, nonlabored ventilation and respiratory function stable Cardiovascular status: blood pressure returned to baseline and stable Postop Assessment: no apparent nausea or vomiting, adequate PO intake and able to ambulate Anesthetic complications: no   No complications documented.  Last Vitals:  Vitals:   10/02/20 1715 10/02/20 1730  BP: 132/80 128/84  Pulse: 75 75  Resp: (!) 21 14  Temp:  36.8 C  SpO2: 96% 98%    Last Pain:  Vitals:   10/02/20 1730  TempSrc:   PainSc: 4                  Zeshan Sena,E. Ercell Perlman

## 2020-10-02 NOTE — Transfer of Care (Addendum)
Immediate Anesthesia Transfer of Care Note  Patient: Julie Fox  Procedure(s) Performed: XI ROBOTIC ASSISTED  BILATERAL SALPING-OOPHORECTOMY, LIVER BX (N/A )  Patient Location: PACU  Anesthesia Type:General  Level of Consciousness: awake, alert , oriented and patient cooperative  Airway & Oxygen Therapy: Patient Spontanous Breathing and Patient connected to face mask oxygen  Post-op Assessment: Report given to RN, Post -op Vital signs reviewed and stable and Patient moving all extremities X 4  Post vital signs: stable  Last Vitals:  Vitals Value Taken Time  BP 132/88 10/02/20 1600  Temp    Pulse 84 10/02/20 1606  Resp 14 10/02/20 1606  SpO2 100 % 10/02/20 1606  Vitals shown include unvalidated device data.  Last Pain:  Vitals:   10/02/20 1113  TempSrc:   PainSc: 0-No pain         Complications: No complications documented.

## 2020-10-02 NOTE — Op Note (Signed)
   Patient: Julie Fox (28-Aug-1976, [id number])  Date of Surgery: 10/02/2020   Preoperative Diagnosis: Transaminitis  Postoperative Diagnosis: Transaminitis  Surgical Procedure: Laparoscopic guided liver biopsy  Operative Team Members:  General Surgery Surgeon: Louanna Raw, MD  Gynecology    * Lafonda Mosses, MD - Primary    * Lahoma Crocker, MD - Assisting   Anesthesiologist: Annye Asa, MD CRNA: Khai Rockers, CRNA   Anesthesia: General   Fluids:  No intake/output data recorded.  Complications: None  Drains:  none   Specimen:  ID Type Source Tests Collected by Time Destination  1 : LIVER BX Tissue PATH Liver benign biopsy SURGICAL PATHOLOGY Lafonda Mosses, MD 10/02/2020 1352      Disposition:  Continued operation in OR  Plan of Care: Per Gynecology team    Indications for Procedure: [name] is a 44 y.o. female who presented to Dr. Charisse March office in need of gynecologic surgery.  She has been worked up for transaminitis and her gastroenterologist requested a liver biopsy be performed at the time of gynecologic surgery.  I recommended performing a laparoscopic guided core needle biopsy of the liver.  The risks, benefits and alternatives of this procedure were discussed with the patient in the preoperative area.  The  Risks discussed included but were not limited to the risk of infection, bleeding and damage to nearby structures.  After a thorough discussion and all questions answered the patient granted consent to proceed.  Findings: Normal appearance of the liver laparoscopically, no lesions identified.  Description of Procedure: On the date stated above, the patient was taken to the operating room suite by the gynecology team.  Please see the operative note from the gynecology team for details.  I was called into the operating room.  I made a small incision under the right ribs and inserted a truecut core needle biopsy  into the abdomen.  Two passes with the true cut needle were to collect adequate core biopsy of the liver.  These were passed off as one liver biopsy specimen.  The liver capsule was cauterized using the laparoscopic scissors.  At this point the operation was turned back over to Dr. Charisse March team, please see their operative note for more detail.    Louanna Raw, MD General, Bariatric, & Minimally Invasive Surgery [facility], [place]

## 2020-10-02 NOTE — Anesthesia Procedure Notes (Signed)
Procedure Name: Intubation Date/Time: 10/02/2020 1:28 PM Performed by: Kinlee Rockers, CRNA Pre-anesthesia Checklist: Patient identified, Patient being monitored, Timeout performed, Emergency Drugs available and Suction available Patient Re-evaluated:Patient Re-evaluated prior to induction Oxygen Delivery Method: Circle System Utilized Preoxygenation: Pre-oxygenation with 100% oxygen Induction Type: IV induction Ventilation: Mask ventilation without difficulty Laryngoscope Size: Miller and 2 Grade View: Grade I Tube type: Oral Tube size: 7.0 mm Number of attempts: 1 Airway Equipment and Method: Stylet Placement Confirmation: ETT inserted through vocal cords under direct vision,  positive ETCO2 and breath sounds checked- equal and bilateral Secured at: 21 cm Tube secured with: Tape Dental Injury: Teeth and Oropharynx as per pre-operative assessment

## 2020-10-02 NOTE — Discharge Instructions (Signed)
Return to work: 1 - 2 weeks.  Activity: 1. Be up and out of the bed during the day.  Take a nap if needed.  You may walk up steps but be careful and use the hand rail.  Stair climbing will tire you more than you think, you may need to stop part way and rest.   2. No lifting or straining for 2 weeks.  3. Do Not drive if you are taking narcotic pain medicine.  4. Shower daily.  Use soap and water on your incision and pat dry; don't rub.     Medications:  - Take ibuprofen and tylenol first line for pain control. Take these regularly (every 6 hours) to decrease the build up of pain.  - If necessary, for severe pain not relieved by ibuprofen, contact Dr Charisse March office and you will be prescribed percocet.  - While taking percocet you should take sennakot every night to reduce the likelihood of constipation. If this causes diarrhea, stop its use.  Diet: 1. Low sodium Heart Healthy Diet is recommended.  2. It is safe to use a laxative if you have difficulty moving your bowels.   Wound Care: 1. Keep clean and dry.  Shower daily.  Reasons to call the Doctor:   Fever - Oral temperature greater than 100.4 degrees Fahrenheit  Foul-smelling vaginal discharge  Difficulty urinating  Nausea and vomiting  Increased pain at the site of the incision that is unrelieved with pain medicine.  Difficulty breathing with or without chest pain  New calf pain especially if only on one side  Sudden, continuing increased vaginal bleeding with or without clots.   Follow-up: 1. See Julie Fox in 4 weeks.  Contacts: For questions or concerns you should contact:  Dr. Jeral Fox at [phone] After hours and on week-ends call [phone] and ask to speak to the physician on call for Gynecologic Oncology   Bilateral Salpingo-Oophorectomy, Care After This sheet gives you information about how to care for yourself after your procedure. Your health care provider may also give you  more specific instructions. If you have problems or questions, contact your health care provider. What can I expect after the procedure? After the procedure, it is common to have:  Abdominal pain.  Some occasional vaginal bleeding (spotting).  Tiredness.  Symptoms of menopause, such as hot flashes, night sweats, or mood swings. Follow these instructions at home: Incision care   Keep your incision area and your bandage (dressing) clean and dry.  Follow instructions from your health care provider about how to take care of your incision. Make sure you: ? Wash your hands with soap and water before you change your dressing. If soap and water are not available, use hand sanitizer. ? Change your dressing as told by your health care provider. ? Leave stitches (sutures), staples, skin glue, or adhesive strips in place. These skin closures may need to stay in place for 2 weeks or longer. If adhesive strip edges start to loosen and curl up, you may trim the loose edges. Do not remove adhesive strips completely unless your health care provider tells you to do that.  Check your incision area every day for signs of infection. Check for: ? Redness, swelling, or pain. ? Fluid or blood. ? Warmth. ? Pus or a bad smell. Activity   Do not drive or use heavy machinery while taking prescription pain medicine.  Do not drive for 24 hours if you received a medicine to help you  relax (sedative) during your procedure.  Take frequent, short walks throughout the day. Rest when you get tired. Ask your health care provider what activities are safe for you.  Avoid activity that requires great effort. Also, avoid heavy lifting. Do not lift anything that is heavier than 10 lbs. (4.5 kg), or the limit that your health care provider tells you, until he or she says that it is safe to do so.  Do not douche, use tampons, or have sex until your health care provider approves. General instructions   To prevent or  treat constipation while you are taking prescription pain medicine, your health care provider may recommend that you: ? Drink enough fluid to keep your urine clear or pale yellow. ? Take over-the-counter or prescription medicines. ? Eat foods that are high in fiber, such as fresh fruits and vegetables, whole grains, and beans. ? Limit foods that are high in fat and processed sugars, such as fried and sweet foods.  Take over-the-counter and prescription medicines only as told by your health care provider.  Do not take baths, swim, or use a hot tub until your health care provider approves. Ask your health care provider if you can take showers. You may only be allowed to take sponge baths for bathing.  Wear compression stockings as told by your health care provider. These stockings help to prevent blood clots and reduce swelling in your legs.  Keep all follow-up visits as told by your health care provider. This is important. Contact a health care provider if:  You have pain when you urinate.  You have pus or a bad smelling discharge coming from your vagina.  You have redness, swelling, or pain around your incision.  You have fluid or blood coming from your incision.  Your incision feels warm to the touch.  You have pus or a bad smell coming from your incision.  You have a fever.  Your incision starts to break open.  You have pain in the abdomen, and it gets worse or does not get better when you take medicine.  You develop a rash.  You develop nausea and vomiting.  You feel lightheaded. Get help right away if:  You develop pain in your chest or leg.  You become short of breath.  You faint.  You have increased bleeding from your vagina. Summary  After the procedure, it is common to have pain, bleeding in the vagina, and symptoms of menopause.  Follow instructions from your health care provider about how to take care of your incision.  Follow instructions from your  health care provider about activities and restrictions.  Check your incision every day for signs of infection and report any symptoms to your health care provider. This information is not intended to replace advice given to you by your health care provider. Make sure you discuss any questions you have with your health care provider. Document Revised: 12/15/2018 Document Reviewed: 11/15/2016 Elsevier Patient Education  2020 [employer].

## 2020-10-03 ENCOUNTER — Telehealth: Payer: Self-pay

## 2020-10-03 ENCOUNTER — Encounter (HOSPITAL_COMMUNITY): Payer: Self-pay | Admitting: Gynecologic Oncology

## 2020-10-03 NOTE — Telephone Encounter (Signed)
Ms. Jerome states that she is eating,drinking, and urinating well. She is experiencing some burnng with urination. Told her that this should decrease in the next day. This is from the foley catheter from surgery. Increase her fluid intake to 8 oz every ~90 minutes.  Call Monday 12-13 if burning continues and or running a low grade temperature ~99.0 or greater. May need to get a urine specimen to check for a UTI. She is not passing gas. Instructed her to increase the senokot-s to 2 tabs bid with 8 oz of water.  If no BM by Sunday 10-06-20 she can add Miralax 1 capful bid. Afebrile. Incisions D&I Pt aware of post op appointments as well as the office number [phone] and after hours number [phone] to call if she has any questions or concerns

## 2020-10-03 NOTE — Telephone Encounter (Signed)
LM for Julie Fox to call the office to discuss how she is doing after her surgery yesterday with Dr.Tucker.

## 2020-10-06 ENCOUNTER — Other Ambulatory Visit: Payer: Self-pay

## 2020-10-06 ENCOUNTER — Other Ambulatory Visit: Payer: Self-pay | Admitting: Gynecologic Oncology

## 2020-10-06 DIAGNOSIS — R1900 Intra-abdominal and pelvic swelling, mass and lump, unspecified site: Secondary | ICD-10-CM

## 2020-10-06 LAB — SURGICAL PATHOLOGY

## 2020-10-06 LAB — CYTOLOGY - NON PAP

## 2020-10-07 ENCOUNTER — Telehealth: Payer: Self-pay | Admitting: *Deleted

## 2020-10-07 NOTE — Telephone Encounter (Signed)
Per Dr Charisse March request fax liver bx results to Dr Thermon Leyland at [place]

## 2020-10-09 ENCOUNTER — Encounter: Payer: Self-pay | Admitting: Gynecologic Oncology

## 2020-10-09 ENCOUNTER — Other Ambulatory Visit: Payer: Self-pay

## 2020-10-09 ENCOUNTER — Other Ambulatory Visit: Payer: Self-pay | Admitting: Internal Medicine

## 2020-10-09 ENCOUNTER — Encounter: Payer: Self-pay | Admitting: *Deleted

## 2020-10-09 ENCOUNTER — Inpatient Hospital Stay (HOSPITAL_BASED_OUTPATIENT_CLINIC_OR_DEPARTMENT_OTHER): Payer: 59 | Admitting: Gynecologic Oncology

## 2020-10-09 DIAGNOSIS — B370 Candidal stomatitis: Secondary | ICD-10-CM

## 2020-10-09 DIAGNOSIS — D271 Benign neoplasm of left ovary: Secondary | ICD-10-CM

## 2020-10-09 DIAGNOSIS — Z90722 Acquired absence of ovaries, bilateral: Secondary | ICD-10-CM

## 2020-10-09 DIAGNOSIS — B3781 Candidal esophagitis: Secondary | ICD-10-CM

## 2020-10-09 DIAGNOSIS — B20 Human immunodeficiency virus [HIV] disease: Secondary | ICD-10-CM

## 2020-10-09 MED ORDER — NYSTATIN 100000 UNIT/ML MT SUSP: 5.0000 mL | Freq: Four times a day (QID) | OROMUCOSAL | 0 refills | Status: DC

## 2020-10-09 NOTE — Progress Notes (Signed)
Gynecologic Oncology Telehealth Consult Note: Gyn-Onc  I connected with Julie Fox on 10/09/20 at  3:15 PM EST by telephone and verified that I am speaking with the correct person using two identifiers.  I discussed the limitations, risks, security and privacy concerns of performing an evaluation and management service by telemedicine and the availability of in-person appointments. I also discussed with the patient that there may be a patient responsible charge related to this service. The patient expressed understanding and agreed to proceed.  Other persons participating in the visit and their role in the encounter: none.  Patient's location: home Provider's location: [facility]  Reason for Visit: follow-up after surgery Interval History: Doing well since surgery. Pain improving daily. Has some RUQ pain near ribs. Is currently taking ibuprofen on average once a day. Bowels moving more regularly since Sunday, denies any urinary symptoms. Tolerating PO intake without nausea or emesis. Has noted significant cough and thick whit coating on tongue - sent photo in mychart earlier today.  Past Medical/Surgical History: Past Medical History:  Diagnosis Date  . Anxiety   . Asthma   . Chronic kidney disease   . Depression   . GAD (generalized anxiety disorder)   . Gallbladder problem   . GERD (gastroesophageal reflux disease)    no meds, diet controlled  . Heartburn   . High cholesterol   . History of epilepsy    childhood seizures - last one age 89 yr, non since  . HIV (human immunodeficiency virus infection) ([place])   . Hx of abnormal cervical Pap smear    2012 per pt, then normal since   . Migraine    otc med prn  . Obesity   . Pneumonia   . Pure hypercholesterolemia   . Seasonal allergies   . Sleep apnea   . Vitamin B12 deficiency   . Vitamin D deficiency     Past Surgical History:  Procedure Laterality Date  . CHOLECYSTECTOMY    . COLONOSCOPY    . ROBOTIC ASSISTED  SALPINGO OOPHERECTOMY N/A 10/02/2020   Procedure: XI ROBOTIC ASSISTED  BILATERAL SALPING-OOPHORECTOMY, LIVER BX;  Surgeon: Lafonda Mosses, MD;  Location: [facility];  Service: Gynecology;  Laterality: N/A;  . TOE SURGERY     age 33-cyst excision per patient  . TONSILLECTOMY  2016  . UPPER GI ENDOSCOPY    . VAGINAL HYSTERECTOMY Bilateral 01/03/2018   Procedure: HYSTERECTOMY VAGINAL WITH SALPINGECTOMY;  Surgeon: Lavonia Drafts, MD;  Location: [facility];  Service: Gynecology;  Laterality: Bilateral;  . WISDOM TOOTH EXTRACTION     x 2 lower    Family History  Problem Relation Age of Onset  . Hypertension Mother   . Hyperlipidemia Mother   . Diabetes Mother   . Heart disease Mother   . Depression Mother   . Anxiety disorder Mother   . Sleep apnea Mother   . Obesity Mother   . Liver cancer Father   . Non-Hodgkin's lymphoma Maternal Grandmother   . Leukemia Maternal Grandfather   . Breast cancer Neg Hx   . Ovarian cancer Neg Hx   . Endometrial cancer Neg Hx   . Pancreatic cancer Neg Hx   . Colon cancer Neg Hx     Social History   Socioeconomic History  . Marital status: Single    Spouse name: Not on file  . Number of children: Not on file  . Years of education: Not on file  . Highest education level: Not on file  Occupational  History  . Not on file  Tobacco Use  . Smoking status: Current Every Day Smoker    Packs/day: 0.25    Years: 20.00    Pack years: 5.00    Types: Cigarettes    Start date: 12/24/1995  . Smokeless tobacco: Never Used  . Tobacco comment: cutting back. chantix did not work.  Vaping Use  . Vaping Use: Never used  Substance and Sexual Activity  . Alcohol use: No    Comment: once a month  . Drug use: No  . Sexual activity: Not Currently    Partners: Male    Birth control/protection: None  Other Topics Concern  . Not on file  Social History Narrative   Work or School: Engineer, structural, 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: lives with mother       Spiritual Beliefs:      Lifestyle: no regular exercise, diet not great   Social Determinants of Radio broadcast assistant Strain: Not on file  Food Insecurity: Not on file  Transportation Needs: Not on file  Physical Activity: Not on file  Stress: Not on file  Social Connections: Not on file    Current Medications:  Current Outpatient Medications:  .  busPIRone (BUSPAR) 7.5 MG tablet, Take 1 tablet (7.5 mg total) by mouth 3 (three) times daily as needed (anxiety). (Patient taking differently: Take 7.5 mg by mouth 3 (three) times daily.), Disp: 45 tablet, Rfl: 0 .  GENVOYA 150-150-200-10 MG TABS tablet, TAKE 1 TABLET BY MOUTH EVERY DAY WITH BREAKFAST, Disp: 30 tablet, Rfl: 1 .  ibuprofen (ADVIL) 600 MG tablet, TAKE 1 TABLET(600 MG) BY MOUTH EVERY 8 HOURS AFTER SURGERY AS NEEDED FOR MODERATE PAIN, Disp: 30 tablet, Rfl: 0 .  omeprazole (PRILOSEC) 40 MG capsule, Take 40 mg by mouth daily., Disp: , Rfl:  .  oxyCODONE (OXY IR/ROXICODONE) 5 MG immediate release tablet, Take 1 tablet (5 mg total) by mouth every 4 (four) hours as needed for severe pain. For AFTER surgery only, do not take and drive, Disp: 15 tablet, Rfl: 0 .  PARoxetine (PAXIL) 40 MG tablet, Take 1 tablet (40 mg total) by mouth daily., Disp: 30 tablet, Rfl: 11 .  senna-docusate (SENOKOT-S) 8.6-50 MG tablet, Take 2 tablets by mouth at bedtime. For AFTER surgery, do not take if having diarrhea, Disp: 30 tablet, Rfl: 0 .  Vitamin D, Ergocalciferol, (DRISDOL) 1.25 MG (50000 UNIT) CAPS capsule, Take 1 capsule (50,000 Units total) by mouth every 7 (seven) days., Disp: 4 capsule, Rfl: 0  Review of Symptoms: Pertinent positives was above.  Physical Exam: LMP 12/26/2017  Deferred given limitations of phone visit  Laboratory & Radiologic Studies: No borderline tumor or malignancy on recent pathology  Assessment & Plan: Julie Fox is a 44 y.o. woman with is one week s/p robotic BSO and liver biopsy with benign  pathology.  Healing well from surgery. Discussed RUQ pain likely from liver biopsy and needle going through her muscle. Discussed expected recovery course, physical activity limitations. My office sent liver biopsy results to her GI. All questions answered.  Mouth findings and symptoms concerning for oral thrush. I sent a prescription for magic mouthwash to her pharmacy. She will let me know her symptoms on Monday next week.  I discussed the assessment and treatment plan with the patient. The patient was provided with an opportunity to ask questions and all were answered. The patient agreed with the plan and demonstrated an understanding of the  instructions.   The patient was advised to call back or see an in-person evaluation if the symptoms worsen or if the condition fails to improve as anticipated.   18 minutes of total time was spent for this patient encounter, including preparation, face-to-face counseling with the patient and coordination of care, and documentation of the encounter.   Jeral Pinch, MD  Division of Gynecologic Oncology  Department of Obstetrics and Gynecology  [facility]

## 2020-10-10 ENCOUNTER — Encounter: Payer: Self-pay | Admitting: Gynecologic Oncology

## 2020-10-13 ENCOUNTER — Encounter (INDEPENDENT_AMBULATORY_CARE_PROVIDER_SITE_OTHER): Payer: Self-pay | Admitting: Family Medicine

## 2020-10-13 ENCOUNTER — Ambulatory Visit: Payer: 59 | Admitting: Internal Medicine

## 2020-10-13 ENCOUNTER — Encounter: Payer: Self-pay | Admitting: Internal Medicine

## 2020-10-13 ENCOUNTER — Ambulatory Visit (INDEPENDENT_AMBULATORY_CARE_PROVIDER_SITE_OTHER): Payer: 59 | Admitting: Family Medicine

## 2020-10-13 ENCOUNTER — Other Ambulatory Visit: Payer: Self-pay

## 2020-10-13 VITALS — BP 124/78 | HR 61 | Temp 97.7°F | Ht 68.0 in | Wt 288.0 lb

## 2020-10-13 VITALS — BP 98/61 | HR 71 | Temp 98.0°F | Ht 68.0 in | Wt 294.0 lb

## 2020-10-13 DIAGNOSIS — R7303 Prediabetes: Secondary | ICD-10-CM | POA: Diagnosis not present

## 2020-10-13 DIAGNOSIS — F419 Anxiety disorder, unspecified: Secondary | ICD-10-CM

## 2020-10-13 DIAGNOSIS — K760 Fatty (change of) liver, not elsewhere classified: Secondary | ICD-10-CM | POA: Diagnosis not present

## 2020-10-13 DIAGNOSIS — Z79899 Other long term (current) drug therapy: Secondary | ICD-10-CM

## 2020-10-13 DIAGNOSIS — Z9189 Other specified personal risk factors, not elsewhere classified: Secondary | ICD-10-CM | POA: Diagnosis not present

## 2020-10-13 DIAGNOSIS — B20 Human immunodeficiency virus [HIV] disease: Secondary | ICD-10-CM

## 2020-10-13 DIAGNOSIS — Z6841 Body Mass Index (BMI) 40.0 and over, adult: Secondary | ICD-10-CM

## 2020-10-13 MED ORDER — WEGOVY 0.5 MG/0.5ML ~~LOC~~ SOAJ: 0.5000 mg | SUBCUTANEOUS | 0 refills | Status: DC

## 2020-10-13 NOTE — Progress Notes (Signed)
RFV: follow for hiv disease  Patient ID: Julie Fox, female   DOB: [birth year], 44 y.o.   MRN: [id number]  HPI Julie Fox is a 44yo F with well controlled hiv disease.who takes medications regularly. No recent illnesses Planning on getting booster Recovering for getting Robotic-assisted laparoscopic bilateral salpingoophorectomy, left ureterolysis; liver biopsy (w. Dr. Thermon Leyland)  Interested in cabenuva injections  Outpatient Encounter Medications as of 10/13/2020  Medication Sig  . busPIRone (BUSPAR) 7.5 MG tablet Take 1 tablet (7.5 mg total) by mouth 3 (three) times daily as needed (anxiety). (Patient taking differently: Take 7.5 mg by mouth 3 (three) times daily.)  . GENVOYA 150-150-200-10 MG TABS tablet TAKE 1 TABLET BY MOUTH EVERY DAY WITH BREAKFAST  . ibuprofen (ADVIL) 600 MG tablet TAKE 1 TABLET(600 MG) BY MOUTH EVERY 8 HOURS AFTER SURGERY AS NEEDED FOR MODERATE PAIN  . nystatin (MYCOSTATIN) 100000 UNIT/ML suspension Take 5 mLs (500,000 Units total) by mouth 4 (four) times daily. Swish and spit 3-4 times a day.  Marland Kitchen omeprazole (PRILOSEC) 40 MG capsule Take 40 mg by mouth daily.  Marland Kitchen PARoxetine (PAXIL) 40 MG tablet Take 1 tablet (40 mg total) by mouth daily.  Marland Kitchen senna-docusate (SENOKOT-S) 8.6-50 MG tablet Take 2 tablets by mouth at bedtime. For AFTER surgery, do not take if having diarrhea  . Vitamin D, Ergocalciferol, (DRISDOL) 1.25 MG (50000 UNIT) CAPS capsule Take 1 capsule (50,000 Units total) by mouth every 7 (seven) days.  Marland Kitchen oxyCODONE (OXY IR/ROXICODONE) 5 MG immediate release tablet Take 1 tablet (5 mg total) by mouth every 4 (four) hours as needed for severe pain. For AFTER surgery only, do not take and drive (Patient not taking: Reported on 10/13/2020)   No facility-administered encounter medications on file as of 10/13/2020.     Patient Active Problem List   Diagnosis Date Noted  . Pelvic mass in female   . Transaminitis 08/18/2020  . Vitamin D deficiency  08/18/2020  . Class 3 severe obesity with serious comorbidity and body mass index (BMI) of 40.0 to 44.9 in adult ([place]) 08/18/2020  . Mild single current episode of major depressive disorder ([place]) 06/20/2018  . Post-operative state 01/03/2018  . Abnormal uterine bleeding (AUB) 12/12/2017  . GAD (generalized anxiety disorder) 06/13/2017  . Hyperlipidemia 06/13/2017  . HIV disease ([place]) 12/23/2016     Health Maintenance Due  Topic Date Due  . PAP SMEAR-Modifier  06/13/2020     Review of Systems Review of Systems  Constitutional: Negative for fever, chills, diaphoresis, activity change, appetite change, fatigue and unexpected weight change.  HENT: Negative for congestion, sore throat, rhinorrhea, sneezing, trouble swallowing and sinus pressure.  Eyes: Negative for photophobia and visual disturbance.  Respiratory: Negative for cough, chest tightness, shortness of breath, wheezing and stridor.  Cardiovascular: Negative for chest pain, palpitations and leg swelling.  Gastrointestinal: Negative for nausea, vomiting, abdominal pain, diarrhea, constipation, blood in stool, abdominal distention and anal bleeding.  Genitourinary: Negative for dysuria, hematuria, flank pain and difficulty urinating.  Musculoskeletal: Negative for myalgias, back pain, joint swelling, arthralgias and gait problem.  Skin: Negative for color change, pallor, rash and wound.  Neurological: Negative for dizziness, tremors, weakness and light-headedness.  Hematological: Negative for adenopathy. Does not bruise/bleed easily.  Psychiatric/Behavioral: Negative for behavioral problems, confusion, sleep disturbance, dysphoric mood, decreased concentration and agitation.    Physical Exam   BP 98/61   Pulse 71   Temp 98 F (36.7 C)   Ht 5\' 8"  (1.727 m)  Wt 294 lb (133.4 kg)   LMP 12/26/2017   BMI 44.70 kg/m   Physical Exam  Constitutional:  oriented to person, place, and time. appears well-developed and  well-nourished. No distress.  HENT: [place]/AT, PERRLA, no scleral icterus Mouth/Throat: Oropharynx is clear and moist. No oropharyngeal exudate.  Cardiovascular: Normal rate, regular rhythm and normal heart sounds. Exam reveals no gallop and no friction rub.  No murmur heard.  Pulmonary/Chest: Effort normal and breath sounds normal. No respiratory distress.  has no wheezes.  Neck = supple, no nuchal rigidity Abdominal: Soft. Bowel sounds are normal.  exhibits no distension. There is no tenderness. Lap incisions healing Lymphadenopathy: no cervical adenopathy. No axillary adenopathy Neurological: alert and oriented to person, place, and time.  Skin: Skin is warm and dry. No rash noted. No erythema.  Psychiatric: a normal mood and affect.  behavior is normal.   Lab Results  Component Value Date   CD4TCELL 54 07/23/2020   Lab Results  Component Value Date   CD4TABS 1,408 07/23/2020   CD4TABS 1,553 06/04/2019   CD4TABS 1,910 02/28/2018   Lab Results  Component Value Date   HIV1RNAQUANT <20 07/23/2020   Lab Results  Component Value Date   HEPBSAB POS (A) 09/27/2016   Lab Results  Component Value Date   LABRPR NON-REACTIVE 07/23/2020    CBC Lab Results  Component Value Date   WBC 7.5 10/01/2020   RBC 4.40 10/01/2020   HGB 14.2 10/01/2020   HCT 42.5 10/01/2020   PLT 321 10/01/2020   MCV 96.6 10/01/2020   MCH 32.3 10/01/2020   MCHC 33.4 10/01/2020   RDW 12.9 10/01/2020   LYMPHSABS 2,657 07/23/2020   MONOABS 0.7 11/06/2017   EOSABS 202 07/23/2020    BMET Lab Results  Component Value Date   NA 135 10/01/2020   K 4.4 10/01/2020   CL 100 10/01/2020   CO2 25 10/01/2020   GLUCOSE 114 (H) 10/01/2020   BUN 12 10/01/2020   CREATININE 0.88 10/01/2020   CALCIUM 9.8 10/01/2020   GFRNONAA >60 10/01/2020   GFRAA 80 09/04/2020      Assessment and Plan HIV disease = continue with biktarvy. Considering cabenuva monthly injections  Long term medication = cr is  stable  Anxiety = appears controlled with current dose of paxil. No change of dosage at this time  Health maintenance = offered covid booster through clinic  Fatty liver = confirmed by recent biopsy. Would explain mild transaminitis

## 2020-10-14 NOTE — Progress Notes (Signed)
Chief Complaint:   OBESITY Julie Fox is here to discuss her progress with her obesity treatment plan along with follow-up of her obesity related diagnoses. Julie Fox is on keeping a food journal and adhering to recommended goals of 1400-1700 calories and 95 grams of protein daily and states she is following her eating plan approximately 0% of the time. Julie Fox states she is doing 0 minutes 0 times per week.  Today's visit was #: 15 Starting weight: 283 lbs Starting date: 12/05/2019 Today's weight: 288 lbs Today's date: 10/13/2020 Total lbs lost to date: 0 Total lbs lost since last in-office visit: 0  Interim History: Julie Fox has been very busy with medical issues since her last appointment. She has a bilateral salping-oopherectomy and a liver biopsy. She hasn't been following journaling secondary to surgery, thrush, and she voices she really hadn't eaten much secondary to pain. She is eating 1 time per week. She is out on FMLA currently. She is hoping to increase activity minimally over the next week.  Subjective:   1. Pre-diabetes Julie Fox's last A1c was 5.9 and insulin 17.2. She is not on GLP-1.  2. Anxiety Julie Fox's symptoms are well controlled with Buspar TID as needed. She is wondering if she can go off Paxil.  3. At risk for side effect of medication Julie Fox is at risk for drug side effects due to restarting Wegovy.  Assessment/Plan:   1. Pre-diabetes [place] will continue to work on weight loss, exercise, and decreasing simple carbohydrates to help decrease the risk of diabetes. Julie Fox is to restart [facility] when her oral intake increases.  2. Anxiety Behavior modification techniques were discussed today to help Keeana deal with her anxiety. We will follow up at her next appointment to discuss tapering off Paxil. Orders and follow up as documented in patient record.   3. At risk for side effect of medication Julie Fox was given approximately 15 minutes of drug side effect  counseling today.  We discussed side effect possibility and risk versus benefits. Julie Fox agreed to the medication and will contact this office if these side effects are intolerable.  Repetitive spaced learning was employed today to elicit superior memory formation and behavioral change.  4. Class 3 severe obesity with serious comorbidity and body mass index (BMI) of 40.0 to 44.9 in adult, unspecified obesity type [facility]) Julie Fox is currently in the action stage of change. As such, her goal is to continue with weight loss efforts. She has agreed to keeping a food journal and adhering to recommended goals of 1300-1500 calories and 90+ grams of protein daily.   We discussed various medication options to help Kelce with her weight loss efforts and we both agreed to start Wegovy 0.5 mg SubQ weekly #6 mL with no refill.  - Semaglutide-Weight Management (WEGOVY) 0.5 MG/0.5ML SOAJ; Inject 0.5 mg into the skin once a week.  Dispense: 6 mL; Refill: 0  Exercise goals: No exercise has been prescribed at this time.  Behavioral modification strategies: increasing lean protein intake, meal planning and cooking strategies, keeping healthy foods in the home, holiday eating strategies  and planning for success.  Julie Fox has agreed to follow-up with our clinic in 3 to 4 weeks. She was informed of the importance of frequent follow-up visits to maximize her success with intensive lifestyle modifications for her multiple health conditions.   Objective:   Blood pressure 124/78, pulse 61, temperature 97.7 F (36.5 C), temperature source Oral, height 5\' 8"  (1.727 m), weight 288 lb (130.6 kg), last menstrual  period 12/26/2017, SpO2 93 %. Body mass index is 43.79 kg/m.  General: Cooperative, alert, well developed, in no acute distress. HEENT: Conjunctivae and lids unremarkable. Cardiovascular: Regular rhythm.  Lungs: Normal work of breathing. Neurologic: No focal deficits.   Lab Results  Component Value Date    CREATININE 0.88 10/01/2020   BUN 12 10/01/2020   NA 135 10/01/2020   K 4.4 10/01/2020   CL 100 10/01/2020   CO2 25 10/01/2020   Lab Results  Component Value Date   ALT 125 (H) 10/01/2020   AST 87 (H) 10/01/2020   ALKPHOS 93 10/01/2020   BILITOT 0.5 10/01/2020   Lab Results  Component Value Date   HGBA1C 5.9 (H) 04/22/2020   HGBA1C 6.0 (H) 12/05/2019   HGBA1C 5.8 06/20/2018   HGBA1C 5.7 06/13/2017   Lab Results  Component Value Date   INSULIN 17.2 04/22/2020   INSULIN 20.8 12/05/2019   Lab Results  Component Value Date   TSH 2.850 12/05/2019   Lab Results  Component Value Date   CHOL 160 07/23/2020   HDL 42 (L) 07/23/2020   LDLCALC 93 07/23/2020   TRIG 150 (H) 07/23/2020   CHOLHDL 3.8 07/23/2020   Lab Results  Component Value Date   WBC 7.5 10/01/2020   HGB 14.2 10/01/2020   HCT 42.5 10/01/2020   MCV 96.6 10/01/2020   PLT 321 10/01/2020   No results found for: IRON, TIBC, FERRITIN  Attestation Statements:   Reviewed by clinician on day of visit: allergies, medications, problem list, medical history, surgical history, family history, social history, and previous encounter notes.   I, Trixie Dredge, am acting as transcriptionist for Coralie Common, MD.  I have reviewed the above documentation for accuracy and completeness, and I agree with the above. - Jinny Blossom, MD

## 2020-10-15 ENCOUNTER — Telehealth: Payer: Self-pay

## 2020-10-15 NOTE — Telephone Encounter (Signed)
[facility] Patient Advocate Encounter   Received notification from [facility] that prior authorization for CABENUVA is required.   PA submitted on 10/15/20 Key [id number] Status is pending    [facility] will continue to follow.   Ileene Patrick, [facility] Patient [facility] Phone: [phone] Fax:  [phone]

## 2020-10-24 NOTE — Progress Notes (Signed)
Gynecologic Oncology Return Clinic Visit  10/27/20  Reason for Visit: post-op follow-up  Interval History: Patient presents today for follow-up after robotic BSO for a complex adnexal mass as well as a liver biopsy on 12/9.  Her postoperative course was complicated by oral thrush, treated with Diflucan.  Today, she notes overall doing well.  Her recovery was more difficult than she anticipated and then has been for prior surgeries.  She thinks she probably overdid it some the Saturday after her surgery.  She notes some intermittent discomfort across her lower abdomen that feels like "bad cramps".  Her appetite is overall decreased and back to more like her baseline, eating 1 meal a day, after recently being able to increase to multiple meals a day with her nutritionist.  She denies any nausea or emesis.  She reports regular bowel and bladder function.  She is also developed insomnia again, problem that she struggled with for much of her life, sleeping about 2-3 hours since surgery.  She has developed some hot flashes which she describes as being 1 to 2 minutes, only at night, and feeling that her head/ears/neck become hot.  Notably, she stopped smoking the Wednesday before surgery and has not had a cigarette since.  Past Medical/Surgical History: Past Medical History:  Diagnosis Date  . Anxiety   . Asthma   . Chronic kidney disease   . Depression   . GAD (generalized anxiety disorder)   . Gallbladder problem   . GERD (gastroesophageal reflux disease)    no meds, diet controlled  . Heartburn   . High cholesterol   . History of epilepsy    childhood seizures - last one age 44 yr, non since  . HIV (human immunodeficiency virus infection) (HCC)   . Hx of abnormal cervical Pap smear    2012 per pt, then normal since   . Migraine    otc med prn  . Obesity   . Pneumonia   . Pure hypercholesterolemia   . Seasonal allergies   . Sleep apnea   . Vitamin B12 deficiency   . Vitamin D deficiency      Past Surgical History:  Procedure Laterality Date  . CHOLECYSTECTOMY    . COLONOSCOPY    . ROBOTIC ASSISTED SALPINGO OOPHERECTOMY N/A 10/02/2020   Procedure: XI ROBOTIC ASSISTED  BILATERAL SALPING-OOPHORECTOMY, LIVER BX;  Surgeon: Carver Fila, MD;  Location: [facility];  Service: Gynecology;  Laterality: N/A;  . TOE SURGERY     age 44-cyst excision per patient  . TONSILLECTOMY  2016  . UPPER GI ENDOSCOPY    . VAGINAL HYSTERECTOMY Bilateral 01/03/2018   Procedure: HYSTERECTOMY VAGINAL WITH SALPINGECTOMY;  Surgeon: Willodean Rosenthal, MD;  Location: [facility];  Service: Gynecology;  Laterality: Bilateral;  . WISDOM TOOTH EXTRACTION     x 2 lower    Family History  Problem Relation Age of Onset  . Hypertension Mother   . Hyperlipidemia Mother   . Diabetes Mother   . Heart disease Mother   . Depression Mother   . Anxiety disorder Mother   . Sleep apnea Mother   . Obesity Mother   . Liver cancer Father   . Non-Hodgkin's lymphoma Maternal Grandmother   . Leukemia Maternal Grandfather   . Breast cancer Neg Hx   . Ovarian cancer Neg Hx   . Endometrial cancer Neg Hx   . Pancreatic cancer Neg Hx   . Colon cancer Neg Hx     Social History   Socioeconomic  History  . Marital status: Single    Spouse name: Not on file  . Number of children: Not on file  . Years of education: Not on file  . Highest education level: Not on file  Occupational History  . Not on file  Tobacco Use  . Smoking status: Current Every Day Smoker    Packs/day: 0.25    Years: 20.00    Pack years: 5.00    Types: Cigarettes    Start date: 12/24/1995  . Smokeless tobacco: Never Used  . Tobacco comment: cutting back. chantix did not work.  Vaping Use  . Vaping Use: Never used  Substance and Sexual Activity  . Alcohol use: No    Comment: once a month  . Drug use: No  . Sexual activity: Not Currently    Partners: Male    Birth control/protection: None  Other Topics Concern  . Not on file   Social History Narrative   Work or School: Engineer, structural, 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: lives with mother      Spiritual Beliefs:      Lifestyle: no regular exercise, diet not great   Social Determinants of Radio broadcast assistant Strain: Not on file  Food Insecurity: Not on file  Transportation Needs: Not on file  Physical Activity: Not on file  Stress: Not on file  Social Connections: Not on file    Current Medications:  Current Outpatient Medications:  .  atorvastatin (LIPITOR) 20 MG tablet, Take 20 mg by mouth daily., Disp: , Rfl:  .  busPIRone (BUSPAR) 7.5 MG tablet, Take 1 tablet (7.5 mg total) by mouth 3 (three) times daily as needed (anxiety). (Patient taking differently: Take 7.5 mg by mouth 3 (three) times daily.), Disp: 45 tablet, Rfl: 0 .  GENVOYA 150-150-200-10 MG TABS tablet, TAKE 1 TABLET BY MOUTH EVERY DAY WITH BREAKFAST, Disp: 30 tablet, Rfl: 1 .  omeprazole (PRILOSEC) 40 MG capsule, Take 40 mg by mouth daily., Disp: , Rfl:  .  PARoxetine (PAXIL) 40 MG tablet, Take 1 tablet (40 mg total) by mouth daily., Disp: 30 tablet, Rfl: 11 .  Semaglutide-Weight Management (WEGOVY) 0.5 MG/0.5ML SOAJ, Inject 0.5 mg into the skin once a week., Disp: 6 mL, Rfl: 0 .  Vitamin D, Ergocalciferol, (DRISDOL) 1.25 MG (50000 UNIT) CAPS capsule, Take 1 capsule (50,000 Units total) by mouth every 7 (seven) days., Disp: 4 capsule, Rfl: 0  Review of Systems: Positive for decreased appetite, hot flashes, pruritus, headaches, anxiety Denies fevers, chills, fatigue, unexplained weight changes. Denies hearing loss, neck lumps or masses, mouth sores, ringing in ears or voice changes. Denies cough or wheezing.  Denies shortness of breath. Denies chest pain or palpitations. Denies leg swelling. Denies abdominal distention, pain, blood in stools, constipation, diarrhea, nausea, vomiting, or early satiety. Denies pain with intercourse, dysuria, frequency, hematuria or incontinence. Denies  pelvic pain, vaginal bleeding or vaginal discharge.   Denies joint pain, back pain or muscle pain/cramps. Denies rash, or wounds. Denies dizziness, numbness or seizures. Denies swollen lymph nodes or glands, denies easy bruising or bleeding. Denies depression, confusion, or decreased concentration.  Physical Exam: BP 121/67 (BP Location: Left Arm, Patient Position: Sitting)   Pulse 79   Temp 97.8 F (36.6 C) (Tympanic)   Resp 17   Ht 5\' 8"  (1.727 m)   Wt 293 lb 6.4 oz (133.1 kg)   LMP 12/26/2017   BMI 44.61 kg/m  General: Alert, oriented, no acute distress. HEENT:  Normocephalic, atraumatic, sclera anicteric. Chest: Unlabored breathing on room air. Abdomen: Obese, soft, nontender.  Normoactive bowel sounds.  No masses or hepatosplenomegaly appreciated.  Well-healing incisions.  Remaining Dermabond removed from the laparoscopic incisions.  Right mid abdominal incision with mild tenderness and fullness in the subcutaneous tissue, suspected to be a seroma or reaction to suture.  No erythema or induration. Extremities: Grossly normal range of motion.  Warm, well perfused.  No edema bilaterally.  Laboratory & Radiologic Studies: A. TUBAL OVARIAN COMPLEX, LEFT, RESECTION:  - Benign serous cystadenoma, 4.5 cm  - Benign fallopian tube and ovary with congestion  - No evidence of malignancy   B. LIVER, BIOPSY:  - Minimally to mildly active steatohepatitis (grade 0-1 of 3). See  comment  - Centrilobular fibrosis with few fibrous septa and changes suggestive  of regression (stage 1-2 of 4)   C. OVARY AND FALLOPIAN TUBE, RIGHT, SALPINGO OOPHORECTOMY:  - Benign fallopian tube and ovary   Assessment & Plan: Julie Fox is a 44 y.o. woman who presented with an enlarging complex adnexal mass now s/p robotic BSO and liver biopsy with benign pathology.  Patient is meeting postoperative milestones although did so more slowly than she was expecting.  We discussed expectations for  continued postoperative healing as well as restrictions.  She is having some menopausal symptoms including hot flashes, which are not very bothersome, and insomnia.  She has struggled with insomnia for much of her life and just recently restarted taking melatonin.  We discussed the risks and benefits of starting hormone therapy given her age and medical comorbidities.  I think it would be reasonable to start her on low to medium dose HRT.  Since she has a uterus, if estrogen was started, I would recommend initiating progesterone as well.  Patient would like to hold off at this time and will call me in 3 to 4 weeks or send a MyChart message to let me know how her symptoms are in her thoughts about HRT.  She will also let me know at that visit the discussion with her GI doctor once they have reviewed her liver biopsy report.  The pathology report was again faxed to the patient's GI physician using the number and name given by the patient today.  Patient was congratulating on more than 3 weeks now of tobacco cessation.  28 minutes of total time was spent for this patient encounter, including preparation, face-to-face counseling with the patient and coordination of care, and documentation of the encounter.  Jeral Pinch, MD  Division of Gynecologic Oncology  Department of Obstetrics and Gynecology  [facility]

## 2020-10-25 ENCOUNTER — Other Ambulatory Visit (INDEPENDENT_AMBULATORY_CARE_PROVIDER_SITE_OTHER): Payer: Self-pay | Admitting: Family Medicine

## 2020-10-25 DIAGNOSIS — E559 Vitamin D deficiency, unspecified: Secondary | ICD-10-CM

## 2020-10-27 ENCOUNTER — Encounter: Payer: Self-pay | Admitting: Gynecologic Oncology

## 2020-10-27 ENCOUNTER — Other Ambulatory Visit: Payer: Self-pay

## 2020-10-27 ENCOUNTER — Telehealth: Payer: Self-pay

## 2020-10-27 ENCOUNTER — Inpatient Hospital Stay: Payer: 59 | Attending: Gynecologic Oncology | Admitting: Gynecologic Oncology

## 2020-10-27 VITALS — BP 121/67 | HR 79 | Temp 97.8°F | Resp 17 | Ht 68.0 in | Wt 293.4 lb

## 2020-10-27 DIAGNOSIS — B20 Human immunodeficiency virus [HIV] disease: Secondary | ICD-10-CM | POA: Insufficient documentation

## 2020-10-27 DIAGNOSIS — E7800 Pure hypercholesterolemia, unspecified: Secondary | ICD-10-CM | POA: Diagnosis not present

## 2020-10-27 DIAGNOSIS — E559 Vitamin D deficiency, unspecified: Secondary | ICD-10-CM | POA: Insufficient documentation

## 2020-10-27 DIAGNOSIS — D271 Benign neoplasm of left ovary: Secondary | ICD-10-CM | POA: Diagnosis not present

## 2020-10-27 DIAGNOSIS — K219 Gastro-esophageal reflux disease without esophagitis: Secondary | ICD-10-CM | POA: Insufficient documentation

## 2020-10-27 DIAGNOSIS — F32A Depression, unspecified: Secondary | ICD-10-CM | POA: Insufficient documentation

## 2020-10-27 DIAGNOSIS — R1900 Intra-abdominal and pelvic swelling, mass and lump, unspecified site: Secondary | ICD-10-CM

## 2020-10-27 DIAGNOSIS — F17210 Nicotine dependence, cigarettes, uncomplicated: Secondary | ICD-10-CM | POA: Diagnosis not present

## 2020-10-27 DIAGNOSIS — Z9079 Acquired absence of other genital organ(s): Secondary | ICD-10-CM | POA: Insufficient documentation

## 2020-10-27 DIAGNOSIS — R232 Flushing: Secondary | ICD-10-CM | POA: Diagnosis not present

## 2020-10-27 DIAGNOSIS — G4700 Insomnia, unspecified: Secondary | ICD-10-CM | POA: Insufficient documentation

## 2020-10-27 DIAGNOSIS — N189 Chronic kidney disease, unspecified: Secondary | ICD-10-CM | POA: Diagnosis not present

## 2020-10-27 DIAGNOSIS — E538 Deficiency of other specified B group vitamins: Secondary | ICD-10-CM | POA: Insufficient documentation

## 2020-10-27 DIAGNOSIS — E8940 Asymptomatic postprocedural ovarian failure: Secondary | ICD-10-CM | POA: Insufficient documentation

## 2020-10-27 DIAGNOSIS — Z79899 Other long term (current) drug therapy: Secondary | ICD-10-CM | POA: Diagnosis not present

## 2020-10-27 DIAGNOSIS — Z90710 Acquired absence of both cervix and uterus: Secondary | ICD-10-CM | POA: Diagnosis not present

## 2020-10-27 DIAGNOSIS — F419 Anxiety disorder, unspecified: Secondary | ICD-10-CM | POA: Insufficient documentation

## 2020-10-27 NOTE — Patient Instructions (Signed)
You are healing well from surgery. We will fax the pathology report again to your GI doctors. Please let me know in the next 3-4 weeks how you are feeling and we will make a decision about hormone replacement.

## 2020-10-27 NOTE — Telephone Encounter (Signed)
Faxed final surgical pathology to Dr. Veleta Miners from 10-02-20 as requested by Dr. Pricilla Holm.

## 2020-10-30 ENCOUNTER — Encounter: Payer: Self-pay | Admitting: Pharmacist

## 2020-10-31 ENCOUNTER — Telehealth: Payer: Self-pay

## 2020-10-31 NOTE — Telephone Encounter (Signed)
[facility] Patient Advocate Encounter  Received notification from [facility] that the request for prior authorization for CABENUVA has been denied due to patient need to try & fail with other medications that is on the plan.       This encounter will continue to be updated until final determination.    Ileene Patrick, [facility] Patient [facility] Phone: [phone] Fax:  [phone]

## 2020-11-03 ENCOUNTER — Ambulatory Visit (INDEPENDENT_AMBULATORY_CARE_PROVIDER_SITE_OTHER): Payer: 59 | Admitting: Family Medicine

## 2020-11-03 ENCOUNTER — Encounter (INDEPENDENT_AMBULATORY_CARE_PROVIDER_SITE_OTHER): Payer: Self-pay | Admitting: Family Medicine

## 2020-11-03 ENCOUNTER — Other Ambulatory Visit: Payer: Self-pay

## 2020-11-03 VITALS — BP 132/74 | HR 58 | Temp 98.1°F | Ht 68.0 in | Wt 289.0 lb

## 2020-11-03 DIAGNOSIS — Z9189 Other specified personal risk factors, not elsewhere classified: Secondary | ICD-10-CM

## 2020-11-03 DIAGNOSIS — Z6841 Body Mass Index (BMI) 40.0 and over, adult: Secondary | ICD-10-CM | POA: Diagnosis not present

## 2020-11-03 DIAGNOSIS — F418 Other specified anxiety disorders: Secondary | ICD-10-CM | POA: Diagnosis not present

## 2020-11-03 DIAGNOSIS — R7303 Prediabetes: Secondary | ICD-10-CM | POA: Diagnosis not present

## 2020-11-03 MED ORDER — BUSPIRONE HCL 7.5 MG PO TABS: 7.5000 mg | ORAL_TABLET | Freq: Three times a day (TID) | ORAL | 0 refills | Status: DC | PRN

## 2020-11-05 ENCOUNTER — Other Ambulatory Visit: Payer: Self-pay | Admitting: Internal Medicine

## 2020-11-05 NOTE — Telephone Encounter (Signed)
LM for pt to call back to discuss symptoms.

## 2020-11-05 NOTE — Progress Notes (Signed)
Chief Complaint:   OBESITY Julie Fox is here to discuss her progress with her obesity treatment plan along with follow-up of her obesity related diagnoses. Julie Fox is on keeping a food journal and adhering to recommended goals of 1300-1500 calories and 90 grams of protein and states she is following her eating plan approximately 50% of the time. Julie Fox states she is walking 15-20 minutes 2 times per week.  Today's visit was #: 18 Starting weight: 283 lbs Starting date: 12/05/2019 Today's weight: 289 lbs Today's date: 11/03/2020 Total lbs lost to date: 0 Total lbs lost since last in-office visit: 0  Interim History: Holidays were ok, but after holidays 3 former co workers died. This has caused Julie Fox to do some emotional eating. She had lasagna one day and another day chicken and dumplings. Julie Fox has also quit smoking. She went to grocery store yesterday, but she still has some indulgent food in the house.  Subjective:   1. Prediabetes Julie Fox has a diagnosis of prediabetes based on her elevated HgA1c and was informed this puts her at greater risk of developing diabetes. She continues to work on diet and exercise to decrease her risk of diabetes. She denies nausea or hypoglycemia. Last A1c was 5.9. She is on GLP-1 and we will do labs at next office visit.  Lab Results  Component Value Date   HGBA1C 5.9 (H) 04/22/2020   Lab Results  Component Value Date   INSULIN 17.2 04/22/2020   INSULIN 20.8 12/05/2019    2. Depression with anxiety Louie's symptoms are reasonably controlled with Buspar and Paxil. She did recently stop smoking.  3. At risk for diabetes mellitus Julie Fox is at higher than average risk for developing diabetes due to obesity.     Assessment/Plan:   1. Prediabetes We will do labs at next office appointment.  2. Depression with anxiety Refill Buspar 7.5 mg po TID prn anxiety #90. No refill.  - busPIRone (BUSPAR) 7.5 MG tablet; Take 1 tablet (7.5 mg  total) by mouth 3 (three) times daily as needed (anxiety).  Dispense: 45 tablet; Refill: 0  3. At risk for diabetes mellitus Julie Fox was given approximately 15 minutes of diabetes education and counseling today. We discussed intensive lifestyle modifications today with an emphasis on weight loss as well as increasing exercise and decreasing simple carbohydrates in her diet. We also reviewed medication options with an emphasis on risk versus benefit of those discussed.   Repetitive spaced learning was employed today to elicit superior memory formation and behavioral change.  4. Class 3 severe obesity with serious comorbidity and body mass index (BMI) of 40.0 to 44.9 in adult, unspecified obesity type [facility])  Julie Fox is currently in the action stage of change. As such, her goal is to continue with weight loss efforts. She has agreed to keeping a food journal and adhering to recommended goals of 1300-1500 calories and 90+ grams of protein daily.   Exercise goals: No exercise has been prescribed at this time.  Behavioral modification strategies: increasing lean protein intake, meal planning and cooking strategies, keeping healthy foods in the home, avoiding temptations and keeping a strict food journal.  Julie Fox has agreed to follow-up with our clinic in 2 weeks. She was informed of the importance of frequent follow-up visits to maximize her success with intensive lifestyle modifications for her multiple health conditions.    Objective:   Blood pressure 132/74, pulse (!) 58, temperature 98.1 F (36.7 C), temperature source Oral, height 5\' 8"  (1.727 m),  weight 289 lb (131.1 kg), last menstrual period 12/26/2017, SpO2 97 %. Body mass index is 43.94 kg/m.  General: Cooperative, alert, well developed, in no acute distress. HEENT: Conjunctivae and lids unremarkable. Cardiovascular: Regular rhythm.  Lungs: Normal work of breathing. Neurologic: No focal deficits.   Lab Results  Component Value  Date   CREATININE 0.88 10/01/2020   BUN 12 10/01/2020   NA 135 10/01/2020   K 4.4 10/01/2020   CL 100 10/01/2020   CO2 25 10/01/2020   Lab Results  Component Value Date   ALT 125 (H) 10/01/2020   AST 87 (H) 10/01/2020   ALKPHOS 93 10/01/2020   BILITOT 0.5 10/01/2020   Lab Results  Component Value Date   HGBA1C 5.9 (H) 04/22/2020   HGBA1C 6.0 (H) 12/05/2019   HGBA1C 5.8 06/20/2018   HGBA1C 5.7 06/13/2017   Lab Results  Component Value Date   INSULIN 17.2 04/22/2020   INSULIN 20.8 12/05/2019   Lab Results  Component Value Date   TSH 2.850 12/05/2019   Lab Results  Component Value Date   CHOL 160 07/23/2020   HDL 42 (L) 07/23/2020   LDLCALC 93 07/23/2020   TRIG 150 (H) 07/23/2020   CHOLHDL 3.8 07/23/2020   Lab Results  Component Value Date   WBC 7.5 10/01/2020   HGB 14.2 10/01/2020   HCT 42.5 10/01/2020   MCV 96.6 10/01/2020   PLT 321 10/01/2020   No results found for: IRON, TIBC, FERRITIN   Attestation Statements:   Reviewed by clinician on day of visit: allergies, medications, problem list, medical history, surgical history, family history, social history, and previous encounter notes.   I, Para March, am acting as transcriptionist for Coralie Common, MD.  I have reviewed the above documentation for accuracy and completeness, and I agree with the above. - Jinny Blossom, MD

## 2020-11-05 NOTE — Telephone Encounter (Signed)
Julie Fox states that the abdominal pain is intermittent.  It is sharp shooting or cramping, aching. Denies n/v,fever,chills. Moving bowels well. Onset ~1 week ago. Heating pad helped last even ing for a while. Pain is a 3-4/10 when it occurs.  She has not taken any medications for discomfort. Reviewed with Julie John, NP. Told Julie Fox to monitor symptoms.  Call if symptoms increase. She can use tylenol or ibuprofen for her pain. Pt verbalized understanding.

## 2020-11-14 ENCOUNTER — Encounter: Payer: Self-pay | Admitting: Gynecologic Oncology

## 2020-11-17 ENCOUNTER — Telehealth: Payer: Self-pay | Admitting: *Deleted

## 2020-11-17 ENCOUNTER — Encounter: Payer: Self-pay | Admitting: Gynecologic Oncology

## 2020-11-17 ENCOUNTER — Encounter: Payer: Self-pay | Admitting: *Deleted

## 2020-11-17 NOTE — Telephone Encounter (Signed)
Called and left the patient a message to call the office back. Patient's work note is available

## 2020-11-17 NOTE — Telephone Encounter (Signed)
Patient called back and ask for the work note be fax to [phone], note sent

## 2020-11-20 ENCOUNTER — Other Ambulatory Visit: Payer: Self-pay

## 2020-11-20 ENCOUNTER — Encounter (INDEPENDENT_AMBULATORY_CARE_PROVIDER_SITE_OTHER): Payer: Self-pay | Admitting: Family Medicine

## 2020-11-20 ENCOUNTER — Ambulatory Visit (INDEPENDENT_AMBULATORY_CARE_PROVIDER_SITE_OTHER): Payer: 59 | Admitting: Family Medicine

## 2020-11-20 VITALS — BP 123/81 | HR 97 | Temp 98.5°F | Ht 68.0 in | Wt 286.0 lb

## 2020-11-20 DIAGNOSIS — F419 Anxiety disorder, unspecified: Secondary | ICD-10-CM | POA: Diagnosis not present

## 2020-11-20 DIAGNOSIS — Z6841 Body Mass Index (BMI) 40.0 and over, adult: Secondary | ICD-10-CM

## 2020-11-20 DIAGNOSIS — E7849 Other hyperlipidemia: Secondary | ICD-10-CM | POA: Diagnosis not present

## 2020-11-20 DIAGNOSIS — Z9189 Other specified personal risk factors, not elsewhere classified: Secondary | ICD-10-CM | POA: Diagnosis not present

## 2020-11-20 DIAGNOSIS — E559 Vitamin D deficiency, unspecified: Secondary | ICD-10-CM | POA: Diagnosis not present

## 2020-11-20 DIAGNOSIS — R7303 Prediabetes: Secondary | ICD-10-CM

## 2020-11-20 MED ORDER — PAROXETINE HCL 20 MG PO TABS: 20.0000 mg | ORAL_TABLET | Freq: Every day | ORAL | 0 refills | Status: DC

## 2020-11-20 MED ORDER — SERTRALINE HCL 25 MG PO TABS: 25.0000 mg | ORAL_TABLET | Freq: Every day | ORAL | 0 refills | Status: DC

## 2020-11-21 LAB — COMPREHENSIVE METABOLIC PANEL
ALT: 176 IU/L — ABNORMAL HIGH (ref 0–32)
AST: 109 IU/L — ABNORMAL HIGH (ref 0–40)
Albumin/Globulin Ratio: 1.6 (ref 1.2–2.2)
Albumin: 4.7 g/dL (ref 3.8–4.8)
Alkaline Phosphatase: 129 IU/L — ABNORMAL HIGH (ref 44–121)
BUN/Creatinine Ratio: 14 (ref 9–23)
BUN: 13 mg/dL (ref 6–24)
Bilirubin Total: 0.2 mg/dL (ref 0.0–1.2)
CO2: 25 mmol/L (ref 20–29)
Calcium: 9.9 mg/dL (ref 8.7–10.2)
Chloride: 101 mmol/L (ref 96–106)
Creatinine, Ser: 0.94 mg/dL (ref 0.57–1.00)
GFR calc Af Amer: 86 mL/min/{1.73_m2} (ref >59)
GFR calc non Af Amer: 75 mL/min/{1.73_m2} (ref >59)
Globulin, Total: 2.9 g/dL (ref 1.5–4.5)
Glucose: 114 mg/dL — ABNORMAL HIGH (ref 65–99)
Potassium: 4.6 mmol/L (ref 3.5–5.2)
Sodium: 139 mmol/L (ref 134–144)
Total Protein: 7.6 g/dL (ref 6.0–8.5)

## 2020-11-21 LAB — LIPID PANEL WITH LDL/HDL RATIO
Cholesterol, Total: 294 mg/dL — ABNORMAL HIGH (ref 100–199)
HDL: 43 mg/dL (ref >39)
LDL Chol Calc (NIH): 210 mg/dL — ABNORMAL HIGH (ref 0–99)
LDL/HDL Ratio: 4.9 ratio — ABNORMAL HIGH (ref 0.0–3.2)
Triglycerides: 211 mg/dL — ABNORMAL HIGH (ref 0–149)
VLDL Cholesterol Cal: 41 mg/dL — ABNORMAL HIGH (ref 5–40)

## 2020-11-21 LAB — HEMOGLOBIN A1C
Est. average glucose Bld gHb Est-mCnc: 134 mg/dL
Hgb A1c MFr Bld: 6.3 % — ABNORMAL HIGH (ref 4.8–5.6)

## 2020-11-21 LAB — VITAMIN D 25 HYDROXY (VIT D DEFICIENCY, FRACTURES): Vit D, 25-Hydroxy: 15 ng/mL — ABNORMAL LOW (ref 30.0–100.0)

## 2020-11-21 LAB — INSULIN, RANDOM: INSULIN: 32.3 u[IU]/mL — ABNORMAL HIGH (ref 2.6–24.9)

## 2020-11-24 NOTE — Progress Notes (Signed)
Chief Complaint:   OBESITY Julie Fox is here to discuss her progress with her obesity treatment plan along with follow-up of her obesity related diagnoses. Julie Fox is on keeping a food journal and adhering to recommended goals of 1200 calories and 95 grams of protein and states she is following her eating plan approximately 90% of the time. Julie Fox states she is not exercising.  Today's visit was #: 64 Starting weight: 283 lbs Starting date: 12/05/2019 Today's weight: 286 lbs Today's date: 11/20/2020 Total lbs lost to date: 0  Total lbs lost since last in-office visit: 3 lbs  Interim History: Julie Fox realizes she has to stand up a little more when she is at work due to stiffness when sitting for 10 hours. Went back to work 4 days ago. Julie Fox went off meal plan a few times. Eating some oatmeal in the am and feeling full most of the day. She often skips lunch due to schedule at work.  Subjective:   1. Vitamin D deficiency  Julie Fox last Vitamin D was 19.8. She is on prescription Vitamin D. Denies nausea, vomiting, or muscle weakness, but notes fatigue.   2. Prediabetes  Last A1c was 5.9 and insulin 17.2. Julie Fox is on GLP-1 currently.  3. Other hyperlipidemia Julie Fox's last LDL within normal limits at 98. Trigly 153, and HDL 40. Julie Fox is on statin with no myalgias but slight transaminitis.   4. Anxiety  Julie Fox has been Paxil 40 mg po daily for years. Symptoms are well managed currently and she denies suicidal or homicidal ideas.  5. At risk for diabetes mellitus Julie Fox is at higher than average risk for developing diabetes due to obesity.     Assessment/Plan:   1. Vitamin D deficiency Low Vitamin D level contributes to fatigue and are associated with obesity, breast, and colon cancer. She agrees to continue to take prescription Vitamin D @50 ,000 IU every week and will follow-up for routine testing of Vitamin D, at least 2-3 times per year to avoid over-replacement. Refill  Vitamin D 50,000 units weekly #4 no refill.   - VITAMIN D 25 Hydroxy (Vit-D Deficiency, Fractures)  2. Prediabetes Julie Fox will continue to work on weight loss, exercise, and decreasing simple carbohydrates to help decrease the risk of diabetes. Will check A1c, insulin and CMP today.  - Hemoglobin A1c - Insulin, random  3. Other hyperlipidemia Cardiovascular risk and specific lipid/LDL goals reviewed.  We discussed several lifestyle modifications today and Julie Fox will continue to work on diet, exercise and weight loss efforts. Orders and follow up as documented in patient record. Will check lipid panel and CMP today.  Counseling Intensive lifestyle modifications are the first line treatment for this issue. . Dietary changes: Increase soluble fiber. Decrease simple carbohydrates. . Exercise changes: Moderate to vigorous-intensity aerobic activity 150 minutes per week if tolerated. . Lipid-lowering medications: see documented in medical record.  - Comprehensive metabolic panel - Lipid Panel With LDL/HDL Ratio  4. Anxiety Behavior modification techniques were discussed today to help Julie Fox deal with her anxiety.  Orders and follow up as documented in patient record. Julie Fox will lower Paxil to 20 mg daily 1 time per week, then lower to 10 mg 1 time per week. Will start Sertraline 25 mg po daily on day 1 of taper and then increase to 50 mg on week 2.  - PARoxetine (PAXIL) 20 MG tablet; Take 1 tablet (20 mg total) by mouth daily.  Dispense: 30 tablet; Refill: 0 - sertraline (ZOLOFT) 25 MG tablet;  Take 1 tablet (25 mg total) by mouth daily.  Dispense: 30 tablet; Refill: 0  5. At risk for diabetes mellitus Julie Fox was given approximately 15 minutes of diabetes education and counseling today. We discussed intensive lifestyle modifications today with an emphasis on weight loss as well as increasing exercise and decreasing simple carbohydrates in her diet. We also reviewed medication options  with an emphasis on risk versus benefit of those discussed.   Repetitive spaced learning was employed today to elicit superior memory formation and behavioral change.  6. Class 3 severe obesity with serious comorbidity and body mass index (BMI) of 40.0 to 44.9 in adult, unspecified obesity type [facility])  Julie Fox is currently in the action stage of change. As such, her goal is to continue with weight loss efforts. She has agreed to keeping a food journal and adhering to recommended goals of 1200-1500 calories and 90+ grams of protein daily.   Exercise goals: No exercise has been prescribed at this time.  Behavioral modification strategies: increasing lean protein intake, meal planning and cooking strategies, keeping healthy foods in the home and planning for success.  Julie Fox has agreed to follow-up with our clinic in 2 weeks. She was informed of the importance of frequent follow-up visits to maximize her success with intensive lifestyle modifications for her multiple health conditions.   Julie Fox was informed we would discuss her lab results at her next visit unless there is a critical issue that needs to be addressed sooner. Julie Fox agreed to keep her next visit at the agreed upon time to discuss these results.  Objective:   Blood pressure 123/81, pulse 97, temperature 98.5 F (36.9 C), temperature source Oral, height 5\' 8"  (1.727 m), weight 286 lb (129.7 kg), last menstrual period 12/26/2017, SpO2 94 %. Body mass index is 43.49 kg/m.  General: Cooperative, alert, well developed, in no acute distress. HEENT: Conjunctivae and lids unremarkable. Cardiovascular: Regular rhythm.  Lungs: Normal work of breathing. Neurologic: No focal deficits.   Lab Results  Component Value Date   CREATININE 0.94 11/20/2020   BUN 13 11/20/2020   NA 139 11/20/2020   K 4.6 11/20/2020   CL 101 11/20/2020   CO2 25 11/20/2020   Lab Results  Component Value Date   ALT 176 (H) 11/20/2020   AST 109 (H)  11/20/2020   ALKPHOS 129 (H) 11/20/2020   BILITOT 0.2 11/20/2020   Lab Results  Component Value Date   HGBA1C 6.3 (H) 11/20/2020   HGBA1C 5.9 (H) 04/22/2020   HGBA1C 6.0 (H) 12/05/2019   HGBA1C 5.8 06/20/2018   HGBA1C 5.7 06/13/2017   Lab Results  Component Value Date   INSULIN 32.3 (H) 11/20/2020   INSULIN 17.2 04/22/2020   INSULIN 20.8 12/05/2019   Lab Results  Component Value Date   TSH 2.850 12/05/2019   Lab Results  Component Value Date   CHOL 294 (H) 11/20/2020   HDL 43 11/20/2020   LDLCALC 210 (H) 11/20/2020   TRIG 211 (H) 11/20/2020   CHOLHDL 3.8 07/23/2020   Lab Results  Component Value Date   WBC 7.5 10/01/2020   HGB 14.2 10/01/2020   HCT 42.5 10/01/2020   MCV 96.6 10/01/2020   PLT 321 10/01/2020   No results found for: IRON, TIBC, FERRITIN    Attestation Statements:   Reviewed by clinician on day of visit: allergies, medications, problem list, medical history, surgical history, family history, social history, and previous encounter notes.    Tula Nakayama, am acting as Location manager for  Coralie Common, MD.  I have reviewed the above documentation for accuracy and completeness, and I agree with the above. - Jinny Blossom, MD

## 2020-12-02 ENCOUNTER — Other Ambulatory Visit (INDEPENDENT_AMBULATORY_CARE_PROVIDER_SITE_OTHER): Payer: Self-pay | Admitting: Family Medicine

## 2020-12-02 DIAGNOSIS — E559 Vitamin D deficiency, unspecified: Secondary | ICD-10-CM

## 2020-12-04 ENCOUNTER — Other Ambulatory Visit (INDEPENDENT_AMBULATORY_CARE_PROVIDER_SITE_OTHER): Payer: Self-pay | Admitting: Family Medicine

## 2020-12-04 ENCOUNTER — Encounter (INDEPENDENT_AMBULATORY_CARE_PROVIDER_SITE_OTHER): Payer: Self-pay | Admitting: Family Medicine

## 2020-12-04 ENCOUNTER — Other Ambulatory Visit: Payer: Self-pay | Admitting: Internal Medicine

## 2020-12-04 ENCOUNTER — Other Ambulatory Visit: Payer: Self-pay

## 2020-12-04 ENCOUNTER — Ambulatory Visit (INDEPENDENT_AMBULATORY_CARE_PROVIDER_SITE_OTHER): Payer: 59 | Admitting: Family Medicine

## 2020-12-04 ENCOUNTER — Encounter (INDEPENDENT_AMBULATORY_CARE_PROVIDER_SITE_OTHER): Payer: Self-pay

## 2020-12-04 VITALS — BP 117/67 | HR 95 | Temp 98.0°F | Ht 68.0 in | Wt 285.0 lb

## 2020-12-04 DIAGNOSIS — Z9189 Other specified personal risk factors, not elsewhere classified: Secondary | ICD-10-CM

## 2020-12-04 DIAGNOSIS — F418 Other specified anxiety disorders: Secondary | ICD-10-CM | POA: Diagnosis not present

## 2020-12-04 DIAGNOSIS — Z6841 Body Mass Index (BMI) 40.0 and over, adult: Secondary | ICD-10-CM

## 2020-12-04 DIAGNOSIS — B20 Human immunodeficiency virus [HIV] disease: Secondary | ICD-10-CM

## 2020-12-04 DIAGNOSIS — R7303 Prediabetes: Secondary | ICD-10-CM | POA: Diagnosis not present

## 2020-12-04 DIAGNOSIS — E559 Vitamin D deficiency, unspecified: Secondary | ICD-10-CM

## 2020-12-04 DIAGNOSIS — E7849 Other hyperlipidemia: Secondary | ICD-10-CM

## 2020-12-04 MED ORDER — VITAMIN D (ERGOCALCIFEROL) 1.25 MG (50000 UNIT) PO CAPS: 50000.0000 [IU] | ORAL_CAPSULE | ORAL | 0 refills | Status: DC

## 2020-12-04 NOTE — Telephone Encounter (Signed)
Message sent to pt-CAS 

## 2020-12-08 NOTE — Progress Notes (Signed)
Chief Complaint:   OBESITY Julie Fox is here to discuss her progress with her obesity treatment plan along with follow-up of her obesity related diagnoses. Julie Fox is on keeping a food journal and adhering to recommended goals of 1300 calories and 95 g protein and states she is following her eating plan approximately 95% of the time. Julie Fox states she is doing 0 minutes 0 times per week.  Today's visit was #: 20 Starting weight: 283 lbs Starting date: 12/05/2019 Today's weight: 285 lbs Today's date: 12/04/2020 Total lbs lost to date: 0 Total lbs lost since last in-office visit: 1 lb  Interim History: pt is doing well on Wegovy 0.5 mg dose. She denies issues getting food in. She is averaging approximately 1200-1250 calories daily and getting protein in daily. Some days she feels very full. She has no upcoming plans for the next few weeks.   Subjective:   1. Pre-diabetes Pt's last A1c 6.3 and insulin level 32.3. She is on GLP-1 medication.  Lab Results  Component Value Date   HGBA1C 6.3 (H) 11/20/2020   Lab Results  Component Value Date   INSULIN 32.3 (H) 11/20/2020   INSULIN 17.2 04/22/2020   INSULIN 20.8 12/05/2019   2. Other hyperlipidemia Pt's last LDL 210, HDL 43, and triglycerides 211. She is not taking a statin secondary to concerns for transaminitis. Restarted Lipitor 20 mg.  Lab Results  Component Value Date   ALT 176 (H) 11/20/2020   AST 109 (H) 11/20/2020   ALKPHOS 129 (H) 11/20/2020   BILITOT 0.2 11/20/2020   Lab Results  Component Value Date   CHOL 294 (H) 11/20/2020   HDL 43 11/20/2020   LDLCALC 210 (H) 11/20/2020   TRIG 211 (H) 11/20/2020   CHOLHDL 3.8 07/23/2020    3. Vitamin D deficiency Pt denies nausea, vomiting, and muscle weakness but notes fatigue. Pt is on prescription Vit D.  4. Depression with anxiety Pt is on Buspar and Zoloft now. She denies issues coming with off Paxil.  5. At risk of diabetes mellitus [name] is at higher than  average risk for developing diabetes due to obesity.   Assessment/Plan:   1. Pre-diabetes Julie Fox will continue to work on weight loss, exercise, and decreasing simple carbohydrates to help decrease the risk of diabetes. Continue GLP-1.   2. Other hyperlipidemia Cardiovascular risk and specific lipid/LDL goals reviewed.  We discussed several lifestyle modifications today and Julie Fox will continue to work on diet, exercise and weight loss efforts. Orders and follow up as documented in patient record. Continue statin therapy and repeat labs in 3 months.  Counseling Intensive lifestyle modifications are the first line treatment for this issue. . Dietary changes: Increase soluble fiber. Decrease simple carbohydrates. . Exercise changes: Moderate to vigorous-intensity aerobic activity 150 minutes per week if tolerated. . Lipid-lowering medications: see documented in medical record.  3. Vitamin D deficiency Low Vitamin D level contributes to fatigue and are associated with obesity, breast, and colon cancer. She agrees to continue to take prescription Vitamin D @50 ,000 IU every week and will follow-up for routine testing of Vitamin D, at least 2-3 times per year to avoid over-replacement. - Vitamin D, Ergocalciferol, (DRISDOL) 1.25 MG (50000 UNIT) CAPS capsule; Take 1 capsule (50,000 Units total) by mouth 2 (two) times a week.  Dispense: 8 capsule; Refill: 0  4. Depression with anxiety Behavior modification techniques were discussed today to help Julie Fox deal with her emotional/non-hunger eating behaviors.  Orders and follow up as documented in  patient record. Refill Buspar 7.5 mg by mouth three times a day as needed for anxiety.  5. At risk of diabetes mellitus Julie Fox was given approximately 15 minutes of diabetes education and counseling today. We discussed intensive lifestyle modifications today with an emphasis on weight loss as well as increasing exercise and decreasing simple carbohydrates in  her diet. We also reviewed medication options with an emphasis on risk versus benefit of those discussed.   Repetitive spaced learning was employed today to elicit superior memory formation and behavioral change.  6. Class 3 severe obesity with serious comorbidity and body mass index (BMI) of 40.0 to 44.9 in adult, unspecified obesity type [facility]) [place] is currently in the action stage of change. As such, her goal is to continue with weight loss efforts. She has agreed to keeping a food journal and adhering to recommended goals of 1300-1700 calories and 95+ g protein.   Exercise goals: As is  Behavioral modification strategies: increasing lean protein intake, meal planning and cooking strategies, keeping healthy foods in the home and keeping a strict food journal.  Julie Fox has agreed to follow-up with our clinic in 2.5 weeks. She was informed of the importance of frequent follow-up visits to maximize her success with intensive lifestyle modifications for her multiple health conditions.   Objective:   Blood pressure 117/67, pulse 95, temperature 98 F (36.7 C), temperature source Oral, height 5\' 8"  (1.727 m), weight 285 lb (129.3 kg), last menstrual period 12/26/2017, SpO2 92 %. Body mass index is 43.33 kg/m.  General: Cooperative, alert, well developed, in no acute distress. HEENT: Conjunctivae and lids unremarkable. Cardiovascular: Regular rhythm.  Lungs: Normal work of breathing. Neurologic: No focal deficits.   Lab Results  Component Value Date   CREATININE 0.94 11/20/2020   BUN 13 11/20/2020   NA 139 11/20/2020   K 4.6 11/20/2020   CL 101 11/20/2020   CO2 25 11/20/2020   Lab Results  Component Value Date   ALT 176 (H) 11/20/2020   AST 109 (H) 11/20/2020   ALKPHOS 129 (H) 11/20/2020   BILITOT 0.2 11/20/2020   Lab Results  Component Value Date   HGBA1C 6.3 (H) 11/20/2020   HGBA1C 5.9 (H) 04/22/2020   HGBA1C 6.0 (H) 12/05/2019   HGBA1C 5.8 06/20/2018   HGBA1C 5.7  06/13/2017   Lab Results  Component Value Date   INSULIN 32.3 (H) 11/20/2020   INSULIN 17.2 04/22/2020   INSULIN 20.8 12/05/2019   Lab Results  Component Value Date   TSH 2.850 12/05/2019   Lab Results  Component Value Date   CHOL 294 (H) 11/20/2020   HDL 43 11/20/2020   LDLCALC 210 (H) 11/20/2020   TRIG 211 (H) 11/20/2020   CHOLHDL 3.8 07/23/2020   Lab Results  Component Value Date   WBC 7.5 10/01/2020   HGB 14.2 10/01/2020   HCT 42.5 10/01/2020   MCV 96.6 10/01/2020   PLT 321 10/01/2020    Attestation Statements:   Reviewed by clinician on day of visit: allergies, medications, problem list, medical history, surgical history, family history, social history, and previous encounter notes.  Coral Ceo, am acting as transcriptionist for Coralie Common, MD.   I have reviewed the above documentation for accuracy and completeness, and I agree with the above. - Jinny Blossom, MD

## 2020-12-10 MED ORDER — WEGOVY 0.5 MG/0.5ML ~~LOC~~ SOAJ: 0.5000 mg | SUBCUTANEOUS | 0 refills | Status: DC

## 2020-12-24 ENCOUNTER — Encounter (INDEPENDENT_AMBULATORY_CARE_PROVIDER_SITE_OTHER): Payer: Self-pay | Admitting: Family Medicine

## 2020-12-24 ENCOUNTER — Ambulatory Visit (INDEPENDENT_AMBULATORY_CARE_PROVIDER_SITE_OTHER): Payer: 59 | Admitting: Family Medicine

## 2020-12-24 ENCOUNTER — Other Ambulatory Visit: Payer: Self-pay

## 2020-12-24 VITALS — BP 113/73 | HR 102 | Temp 98.1°F | Ht 68.0 in | Wt 284.0 lb

## 2020-12-24 DIAGNOSIS — F418 Other specified anxiety disorders: Secondary | ICD-10-CM

## 2020-12-24 DIAGNOSIS — E559 Vitamin D deficiency, unspecified: Secondary | ICD-10-CM | POA: Diagnosis not present

## 2020-12-24 DIAGNOSIS — Z9189 Other specified personal risk factors, not elsewhere classified: Secondary | ICD-10-CM

## 2020-12-24 DIAGNOSIS — Z6841 Body Mass Index (BMI) 40.0 and over, adult: Secondary | ICD-10-CM

## 2020-12-24 MED ORDER — VITAMIN D (ERGOCALCIFEROL) 1.25 MG (50000 UNIT) PO CAPS: 50000.0000 [IU] | ORAL_CAPSULE | ORAL | 0 refills | Status: DC

## 2020-12-24 MED ORDER — WEGOVY 0.5 MG/0.5ML ~~LOC~~ SOAJ: 0.5000 mg | SUBCUTANEOUS | 0 refills | Status: DC

## 2020-12-25 NOTE — Progress Notes (Signed)
Chief Complaint:   OBESITY Julie Fox is here to discuss her progress with her obesity treatment plan along with follow-up of her obesity related diagnoses. Julie Fox is on keeping a food journal and adhering to recommended goals of 1300-1700 calories and 95+ g protein and states she is following her eating plan approximately 50% of the time. Julie Fox states she is doing 0 minutes 0 times per week.  Today's visit was #: 21 Starting weight: 283 lbs Starting date: 12/05/2019 Today's weight: 284 lbs Today's date: 12/24/2020 Total lbs lost to date: 0 Total lbs lost since last in-office visit: 1 lb  Interim History: Julie Fox had GI virus that is circulating over the last 2 weeks. She is feeling better but she is still feeling occasional nausea. She did still take [place] when she was ill. She wants to continue to journal. She has no plans for the next few weeks except training.  Subjective:   1. Vitamin D deficiency Julie Fox's last Vit D level was 15.0. She denies nausea, vomiting, and muscle weakness but notes fatigue. Pt is on prescription Vit D twice a week.  2. Depression with anxiety Julie Fox denies suicidal or homicidal ideations. She has some symptoms of increased energy when starting sertraline but feels more level now.  3. At risk for deficient intake of food Julie Fox is at risk for deficient intake of food due to gastroenteritis.  Assessment/Plan:   1. Vitamin D deficiency Low Vitamin D level contributes to fatigue and are associated with obesity, breast, and colon cancer. She agrees to continue to take prescription Vitamin D @50 ,000 IU twice a week and will follow-up for routine testing of Vitamin D, at least 2-3 times per year to avoid over-replacement.  - Vitamin D, Ergocalciferol, (DRISDOL) 1.25 MG (50000 UNIT) CAPS capsule; Take 1 capsule (50,000 Units total) by mouth 2 (two) times a week.  Dispense: 8 capsule; Refill: 0  2. Depression with anxiety Behavior modification  techniques were discussed today to help Julie Fox deal with her emotional/non-hunger eating behaviors.  Orders and follow up as documented in patient record. Continue Zoloft and Buspar.  3. At risk for deficient intake of food Julie Fox was given approximately 15 minutes of deficit intake of food prevention counseling today. Julie Fox is at risk for eating too few calories based on current food recall. She was encouraged to focus on meeting caloric and protein goals according to her recommended meal plan.   4. Class 3 severe obesity with serious comorbidity and body mass index (BMI) of 40.0 to 44.9 in adult, unspecified obesity type Julie Fox) Julie Fox is currently in the action stage of change. As such, her goal is to continue with weight loss efforts. She has agreed to keeping a food journal and adhering to recommended goals of 1300-1700 calories and 95+ g protein.   We discussed various medication options to help Julie Fox with her weight loss efforts and we both agreed to continue Wegovy 0.5 mg, as per below. - Semaglutide-Weight Management (WEGOVY) 0.5 MG/0.5ML SOAJ; Inject 0.5 mg into the skin once a week.  Dispense: 6 mL; Refill: 0  Exercise goals: No exercise has been prescribed at this time.  Behavioral modification strategies: increasing lean protein intake, meal planning and cooking strategies, keeping healthy foods in the home and planning for success.  Julie Fox has agreed to follow-up with our clinic in 2-3 weeks. She was informed of the importance of frequent follow-up visits to maximize her success with intensive lifestyle modifications for her multiple health conditions.  Objective:   Blood pressure 113/73, pulse (!) 102, temperature 98.1 F (36.7 C), temperature source Oral, height 5\' 8"  (1.727 m), weight 284 lb (128.8 kg), last menstrual period 12/26/2017, SpO2 96 %. Body mass index is 43.18 kg/m.  General: Cooperative, alert, well developed, in no acute distress. HEENT: Conjunctivae and  lids unremarkable. Cardiovascular: Regular rhythm.  Lungs: Normal work of breathing. Neurologic: No focal deficits.   Lab Results  Component Value Date   CREATININE 0.94 11/20/2020   BUN 13 11/20/2020   NA 139 11/20/2020   K 4.6 11/20/2020   CL 101 11/20/2020   CO2 25 11/20/2020   Lab Results  Component Value Date   ALT 176 (H) 11/20/2020   AST 109 (H) 11/20/2020   ALKPHOS 129 (H) 11/20/2020   BILITOT 0.2 11/20/2020   Lab Results  Component Value Date   HGBA1C 6.3 (H) 11/20/2020   HGBA1C 5.9 (H) 04/22/2020   HGBA1C 6.0 (H) 12/05/2019   HGBA1C 5.8 06/20/2018   HGBA1C 5.7 06/13/2017   Lab Results  Component Value Date   INSULIN 32.3 (H) 11/20/2020   INSULIN 17.2 04/22/2020   INSULIN 20.8 12/05/2019   Lab Results  Component Value Date   TSH 2.850 12/05/2019   Lab Results  Component Value Date   CHOL 294 (H) 11/20/2020   HDL 43 11/20/2020   LDLCALC 210 (H) 11/20/2020   TRIG 211 (H) 11/20/2020   CHOLHDL 3.8 07/23/2020   Lab Results  Component Value Date   WBC 7.5 10/01/2020   HGB 14.2 10/01/2020   HCT 42.5 10/01/2020   MCV 96.6 10/01/2020   PLT 321 10/01/2020    Attestation Statements:   Reviewed by clinician on day of visit: allergies, medications, problem list, medical history, surgical history, family history, social history, and previous encounter notes.  Coral Ceo, am acting as transcriptionist for Coralie Common, MD.   I have reviewed the above documentation for accuracy and completeness, and I agree with the above. - Jinny Blossom, MD

## 2021-01-04 ENCOUNTER — Other Ambulatory Visit (INDEPENDENT_AMBULATORY_CARE_PROVIDER_SITE_OTHER): Payer: Self-pay | Admitting: Family Medicine

## 2021-01-04 DIAGNOSIS — E559 Vitamin D deficiency, unspecified: Secondary | ICD-10-CM

## 2021-01-05 ENCOUNTER — Other Ambulatory Visit (INDEPENDENT_AMBULATORY_CARE_PROVIDER_SITE_OTHER): Payer: Self-pay | Admitting: Family Medicine

## 2021-01-05 ENCOUNTER — Encounter (INDEPENDENT_AMBULATORY_CARE_PROVIDER_SITE_OTHER): Payer: Self-pay

## 2021-01-05 DIAGNOSIS — F419 Anxiety disorder, unspecified: Secondary | ICD-10-CM

## 2021-01-05 NOTE — Telephone Encounter (Signed)
Last seen by Dr. Ukleja. 

## 2021-01-05 NOTE — Telephone Encounter (Signed)
Message sent to pt-CAS 

## 2021-01-14 ENCOUNTER — Encounter (INDEPENDENT_AMBULATORY_CARE_PROVIDER_SITE_OTHER): Payer: Self-pay | Admitting: Family Medicine

## 2021-01-14 ENCOUNTER — Other Ambulatory Visit: Payer: Self-pay

## 2021-01-14 ENCOUNTER — Ambulatory Visit (INDEPENDENT_AMBULATORY_CARE_PROVIDER_SITE_OTHER): Payer: 59 | Admitting: Family Medicine

## 2021-01-14 VITALS — BP 108/74 | HR 72 | Temp 98.4°F | Ht 68.0 in | Wt 288.0 lb

## 2021-01-14 DIAGNOSIS — E559 Vitamin D deficiency, unspecified: Secondary | ICD-10-CM | POA: Diagnosis not present

## 2021-01-14 DIAGNOSIS — F419 Anxiety disorder, unspecified: Secondary | ICD-10-CM | POA: Diagnosis not present

## 2021-01-14 DIAGNOSIS — Z9189 Other specified personal risk factors, not elsewhere classified: Secondary | ICD-10-CM | POA: Diagnosis not present

## 2021-01-14 DIAGNOSIS — Z6841 Body Mass Index (BMI) 40.0 and over, adult: Secondary | ICD-10-CM

## 2021-01-14 DIAGNOSIS — F32A Depression, unspecified: Secondary | ICD-10-CM | POA: Diagnosis not present

## 2021-01-14 MED ORDER — SERTRALINE HCL 25 MG PO TABS: 25.0000 mg | ORAL_TABLET | Freq: Every day | ORAL | 0 refills | Status: DC

## 2021-01-14 MED ORDER — WEGOVY 1 MG/0.5ML ~~LOC~~ SOAJ: 1.0000 mg | SUBCUTANEOUS | 0 refills | Status: DC

## 2021-01-14 MED ORDER — VITAMIN D (ERGOCALCIFEROL) 1.25 MG (50000 UNIT) PO CAPS: 50000.0000 [IU] | ORAL_CAPSULE | ORAL | 0 refills | Status: DC

## 2021-01-15 NOTE — Progress Notes (Signed)
Chief Complaint:   OBESITY Julie Fox is here to discuss her progress with her obesity treatment plan along with follow-up of her obesity related diagnoses. Julie Fox is on keeping a food journal and adhering to recommended goals of 1900 calories and 90 protein and states she is following her eating plan approximately 100% of the time. Julie Fox states she is walking 20 minutes 5 times per week.  Today's visit was #: 22 Starting weight: 283 lbs Starting date: 12/05/2019 Today's weight: 288 lbs Today's date: 01/14/2021 Total lbs lost to date: 0 Total lbs lost since last in-office visit: 0  Interim History: Julie Fox voices that she's been eating a significant amount recently. She has been very hungry all day long. Her sleep has bee ok and she isn't particularly stressed. She is getting in ~2000 cal/day and ~90 g protein. She is drinking lots of water daily.  Subjective:   1. Vitamin D deficiency Julie Fox's last Vit D level was 15.0. She denies nausea, vomiting, and muscle weakness but notes fatigue. Pt is on prescription Vit D.  2. Anxiety and depression Julie Fox reports her symptoms are better controlled with no side effects of Zoloft.  3. At risk for side effect of medication Julie Fox is at risk for side effect of medication due to increase dose of Wegovy.  Assessment/Plan:   1. Vitamin D deficiency Low Vitamin D level contributes to fatigue and are associated with obesity, breast, and colon cancer. She agrees to continue to take prescription Vitamin D @50 ,000 IU every week and will follow-up for routine testing of Vitamin D, at least 2-3 times per year to avoid over-replacement.  - Vitamin D, Ergocalciferol, (DRISDOL) 1.25 MG (50000 UNIT) CAPS capsule; Take 1 capsule (50,000 Units total) by mouth every 7 (seven) days.  Dispense: 8 capsule; Refill: 0  2. Anxiety and depression Behavior modification techniques were discussed today to help Julie Fox deal with her anxiety.  Orders and follow  up as documented in patient record. Behavior modification techniques were discussed today to help Julie Fox deal with her emotional/non-hunger eating behaviors.  Orders and follow up as documented in patient record.   - sertraline (ZOLOFT) 25 MG tablet; Take 1 tablet (25 mg total) by mouth daily.  Dispense: 30 tablet; Refill: 0  3. At risk for side effect of medication Julie Fox was given approximately 15 minutes of drug side effect counseling today.  We discussed side effect possibility and risk versus benefits. Julie Fox agreed to the medication and will contact this office if these side effects are intolerable.  Repetitive spaced learning was employed today to elicit superior memory formation and behavioral change.  4. Class 3 severe obesity with serious comorbidity and body mass index (BMI) of 40.0 to 44.9 in adult, unspecified obesity type [facility]) Julie Fox is currently in the action stage of change. As such, her goal is to continue with weight loss efforts. She has agreed to keeping a food journal and adhering to recommended goals of 1800-1900 calories and 125+ g protein.   We discussed various medication options to help Julie Fox with her weight loss efforts and we both agreed to increasing Wegovy to 1 mg, as per below. - Semaglutide-Weight Management (WEGOVY) 1 MG/0.5ML SOAJ; Inject 1 mg into the skin once a week.  Dispense: 2 mL; Refill: 0  Exercise goals: Start 15-20 mins 3 times a week of weight bearing activity/resistance training.  Behavioral modification strategies: increasing lean protein intake, meal planning and cooking strategies, keeping healthy foods in the home and planning for  success.  Julie Fox has agreed to follow-up with our clinic in 2-3 weeks. She was informed of the importance of frequent follow-up visits to maximize her success with intensive lifestyle modifications for her multiple health conditions.   Objective:   Blood pressure 108/74, pulse 72, temperature 98.4 F (36.9  C), temperature source Oral, height 5\' 8"  (1.727 m), weight 288 lb (130.6 kg), last menstrual period 12/26/2017, SpO2 96 %. Body mass index is 43.79 kg/m.  General: Cooperative, alert, well developed, in no acute distress. HEENT: Conjunctivae and lids unremarkable. Cardiovascular: Regular rhythm.  Lungs: Normal work of breathing. Neurologic: No focal deficits.   Lab Results  Component Value Date   CREATININE 0.94 11/20/2020   BUN 13 11/20/2020   NA 139 11/20/2020   K 4.6 11/20/2020   CL 101 11/20/2020   CO2 25 11/20/2020   Lab Results  Component Value Date   ALT 176 (H) 11/20/2020   AST 109 (H) 11/20/2020   ALKPHOS 129 (H) 11/20/2020   BILITOT 0.2 11/20/2020   Lab Results  Component Value Date   HGBA1C 6.3 (H) 11/20/2020   HGBA1C 5.9 (H) 04/22/2020   HGBA1C 6.0 (H) 12/05/2019   HGBA1C 5.8 06/20/2018   HGBA1C 5.7 06/13/2017   Lab Results  Component Value Date   INSULIN 32.3 (H) 11/20/2020   INSULIN 17.2 04/22/2020   INSULIN 20.8 12/05/2019   Lab Results  Component Value Date   TSH 2.850 12/05/2019   Lab Results  Component Value Date   CHOL 294 (H) 11/20/2020   HDL 43 11/20/2020   LDLCALC 210 (H) 11/20/2020   TRIG 211 (H) 11/20/2020   CHOLHDL 3.8 07/23/2020   Lab Results  Component Value Date   WBC 7.5 10/01/2020   HGB 14.2 10/01/2020   HCT 42.5 10/01/2020   MCV 96.6 10/01/2020   PLT 321 10/01/2020     Attestation Statements:   Reviewed by clinician on day of visit: allergies, medications, problem list, medical history, surgical history, family history, social history, and previous encounter notes.  Coral Ceo, am acting as transcriptionist for Coralie Common, MD.   I have reviewed the above documentation for accuracy and completeness, and I agree with the above. - Jinny Blossom, MD

## 2021-02-04 ENCOUNTER — Ambulatory Visit: Payer: 59 | Admitting: Internal Medicine

## 2021-02-04 ENCOUNTER — Encounter (INDEPENDENT_AMBULATORY_CARE_PROVIDER_SITE_OTHER): Payer: Self-pay | Admitting: Family Medicine

## 2021-02-04 ENCOUNTER — Ambulatory Visit (INDEPENDENT_AMBULATORY_CARE_PROVIDER_SITE_OTHER): Payer: 59 | Admitting: Family Medicine

## 2021-02-04 ENCOUNTER — Encounter: Payer: Self-pay | Admitting: Internal Medicine

## 2021-02-04 ENCOUNTER — Other Ambulatory Visit: Payer: Self-pay

## 2021-02-04 VITALS — BP 127/79 | HR 67 | Temp 98.1°F | Ht 68.0 in | Wt 282.0 lb

## 2021-02-04 VITALS — BP 128/78 | HR 77 | Temp 97.5°F | Ht 68.0 in | Wt 288.0 lb

## 2021-02-04 DIAGNOSIS — R7401 Elevation of levels of liver transaminase levels: Secondary | ICD-10-CM

## 2021-02-04 DIAGNOSIS — Z6841 Body Mass Index (BMI) 40.0 and over, adult: Secondary | ICD-10-CM

## 2021-02-04 DIAGNOSIS — Z789 Other specified health status: Secondary | ICD-10-CM

## 2021-02-04 DIAGNOSIS — B20 Human immunodeficiency virus [HIV] disease: Secondary | ICD-10-CM | POA: Diagnosis not present

## 2021-02-04 DIAGNOSIS — F419 Anxiety disorder, unspecified: Secondary | ICD-10-CM | POA: Diagnosis not present

## 2021-02-04 DIAGNOSIS — F32A Depression, unspecified: Secondary | ICD-10-CM

## 2021-02-04 DIAGNOSIS — Z9189 Other specified personal risk factors, not elsewhere classified: Secondary | ICD-10-CM

## 2021-02-04 DIAGNOSIS — Z79899 Other long term (current) drug therapy: Secondary | ICD-10-CM

## 2021-02-04 MED ORDER — SERTRALINE HCL 50 MG PO TABS: 50.0000 mg | ORAL_TABLET | Freq: Every day | ORAL | 0 refills | Status: DC

## 2021-02-04 MED ORDER — GENVOYA 150-150-200-10 MG PO TABS: 1.0000 | ORAL_TABLET | Freq: Every day | ORAL | 11 refills | Status: DC

## 2021-02-04 NOTE — Patient Instructions (Addendum)
   COVID-19 info: [email or web address] (white [email or web address]) Address: [address], [place], [place] [address] Hours:  Open ? Closes 5PM (Monday through Friday) Phone: [phone]  [facility]- online appt

## 2021-02-04 NOTE — Progress Notes (Signed)
RFV: follow up for hiv disease  Patient ID: Julie Fox, female   DOB: 03/08/1976, 45 y.o.   MRN: [id number]  HPI Julie Fox is a 45yo F with well controlled hiv disease, CD 4 count of 1408/VL<20 (sep 2021) on genvoya. Also has hx of anxiety, GERD, and HLD.   Has missed 5 doses in the last month.forgetful.   Has been taking weekly injection for 6 month for weight loss, which she felt it curb her appetite. She had intermittent weight loss/gain for the last year. She saw her weight loss management physician, dr Jearld Shines. Currently on a 2000 meal plan with 125g of protein.   Had Gi illness in early march -- got tested for covid 19 x 3 -- which was negative  Soc Hx: Quit smoking in December; Cruise in July  Outpatient Encounter Medications as of 02/04/2021  Medication Sig  . atorvastatin (LIPITOR) 20 MG tablet TAKE 1 TABLET BY MOUTH EVERY DAY  . GENVOYA 150-150-200-10 MG TABS tablet TAKE 1 TABLET BY MOUTH EVERY DAY WITH BREAKFAST  . omeprazole (PRILOSEC) 40 MG capsule Take 40 mg by mouth daily.  . sertraline (ZOLOFT) 50 MG tablet Take 1 tablet (50 mg total) by mouth daily.  . Vitamin D, Ergocalciferol, (DRISDOL) 1.25 MG (50000 UNIT) CAPS capsule Take 1 capsule (50,000 Units total) by mouth every 7 (seven) days.  . Semaglutide-Weight Management (WEGOVY) 1 MG/0.5ML SOAJ Inject 1 mg into the skin once a week. (Patient not taking: Reported on 02/04/2021)   No facility-administered encounter medications on file as of 02/04/2021.     Patient Active Problem List   Diagnosis Date Noted  . Pelvic mass in female   . Transaminitis 08/18/2020  . Vitamin D deficiency 08/18/2020  . Class 3 severe obesity with serious comorbidity and body mass index (BMI) of 40.0 to 44.9 in adult ([place]) 08/18/2020  . Mild single current episode of major depressive disorder ([place]) 06/20/2018  . Post-operative state 01/03/2018  . Abnormal uterine bleeding (AUB) 12/12/2017  . GAD (generalized anxiety disorder)  06/13/2017  . Hyperlipidemia 06/13/2017  . HIV disease ([place]) 12/23/2016     Health Maintenance Due  Topic Date Due  . PAP SMEAR-Modifier  06/13/2020     Review of Systems 12 point ros is negative except what is mentioned above Physical Exam   BP 128/78   Pulse 77   Temp (!) 97.5 F (36.4 C) (Oral)   Ht 5\' 8"  (1.727 m)   Wt 288 lb (130.6 kg)   LMP 12/26/2017   SpO2 98%   BMI 43.79 kg/m   Physical Exam  Constitutional:  oriented to person, place, and time. appears well-developed and well-nourished. No distress.  HENT: [place]/AT, PERRLA, no scleral icterus Mouth/Throat: Oropharynx is clear and moist. No oropharyngeal exudate.  Cardiovascular: Normal rate, regular rhythm and normal heart sounds. Exam reveals no gallop and no friction rub.  No murmur heard.  Pulmonary/Chest: Effort normal and breath sounds normal. No respiratory distress.  has no wheezes.  Neck = supple, no nuchal rigidity Abdominal: Soft. Bowel sounds are normal.  exhibits no distension. There is no tenderness.  Lymphadenopathy: no cervical adenopathy. No axillary adenopathy Neurological: alert and oriented to person, place, and time.  Skin: Skin is warm and dry. No rash noted. No erythema.  Psychiatric: a normal mood and affect.  behavior is normal.   Lab Results  Component Value Date   CD4TCELL 54 07/23/2020   Lab Results  Component Value Date   CD4TABS 1,408 07/23/2020  CD4TABS 1,553 06/04/2019   CD4TABS 1,910 02/28/2018   Lab Results  Component Value Date   HIV1RNAQUANT <20 07/23/2020   Lab Results  Component Value Date   HEPBSAB POS (A) 09/27/2016   Lab Results  Component Value Date   LABRPR NON-REACTIVE 07/23/2020    CBC Lab Results  Component Value Date   WBC 7.5 10/01/2020   RBC 4.40 10/01/2020   HGB 14.2 10/01/2020   HCT 42.5 10/01/2020   PLT 321 10/01/2020   MCV 96.6 10/01/2020   MCH 32.3 10/01/2020   MCHC 33.4 10/01/2020   RDW 12.9 10/01/2020   LYMPHSABS 2,657 07/23/2020    MONOABS 0.7 11/06/2017   EOSABS 202 07/23/2020    BMET Lab Results  Component Value Date   NA 139 11/20/2020   K 4.6 11/20/2020   CL 101 11/20/2020   CO2 25 11/20/2020   GLUCOSE 114 (H) 11/20/2020   BUN 13 11/20/2020   CREATININE 0.94 11/20/2020   CALCIUM 9.9 11/20/2020   GFRNONAA 75 11/20/2020   GFRAA 86 11/20/2020      Assessment and Plan  hiv disease = will check labs, concern that she has missed too many doses and now has low level viremia. Spent 10 min discussion of  Adherence counseling  Long term medication management= will check cr.  Women's health = had mammogram in oct' 2021. Has hysterectomy last year.  Smoking cessation = applauded her for quiting smoking

## 2021-02-05 LAB — T-HELPER CELL (CD4) - (RCID CLINIC ONLY)
CD4 % Helper T Cell: 55 % (ref 33–65)
CD4 T Cell Abs: 1731 /uL (ref 400–1790)

## 2021-02-07 LAB — CBC WITH DIFFERENTIAL/PLATELET
Absolute Monocytes: 421 cells/uL (ref 200–950)
Basophils Absolute: 79 cells/uL (ref 0–200)
Basophils Relative: 1.3 %
Eosinophils Absolute: 256 cells/uL (ref 15–500)
Eosinophils Relative: 4.2 %
HCT: 42.2 % (ref 35.0–45.0)
Hemoglobin: 14.2 g/dL (ref 11.7–15.5)
Lymphs Abs: 3062 cells/uL (ref 850–3900)
MCH: 31.7 pg (ref 27.0–33.0)
MCHC: 33.6 g/dL (ref 32.0–36.0)
MCV: 94.2 fL (ref 80.0–100.0)
MPV: 11.1 fL (ref 7.5–12.5)
Monocytes Relative: 6.9 %
Neutro Abs: 2281 cells/uL (ref 1500–7800)
Neutrophils Relative %: 37.4 %
Platelets: 288 10*3/uL (ref 140–400)
RBC: 4.48 10*6/uL (ref 3.80–5.10)
RDW: 12.3 % (ref 11.0–15.0)
Total Lymphocyte: 50.2 %
WBC: 6.1 10*3/uL (ref 3.8–10.8)

## 2021-02-07 LAB — COMPLETE METABOLIC PANEL WITH GFR
AG Ratio: 1.5 (calc) (ref 1.0–2.5)
ALT: 146 U/L — ABNORMAL HIGH (ref 6–29)
AST: 75 U/L — ABNORMAL HIGH (ref 10–30)
Albumin: 4.4 g/dL (ref 3.6–5.1)
Alkaline phosphatase (APISO): 114 U/L (ref 31–125)
BUN: 13 mg/dL (ref 7–25)
CO2: 27 mmol/L (ref 20–32)
Calcium: 10.2 mg/dL (ref 8.6–10.2)
Chloride: 103 mmol/L (ref 98–110)
Creat: 0.9 mg/dL (ref 0.50–1.10)
GFR, Est African American: 91 mL/min/{1.73_m2} (ref >=60)
GFR, Est Non African American: 78 mL/min/{1.73_m2} (ref >=60)
Globulin: 2.9 g/dL (calc) (ref 1.9–3.7)
Glucose, Bld: 106 mg/dL — ABNORMAL HIGH (ref 65–99)
Potassium: 4 mmol/L (ref 3.5–5.3)
Sodium: 139 mmol/L (ref 135–146)
Total Bilirubin: 0.4 mg/dL (ref 0.2–1.2)
Total Protein: 7.3 g/dL (ref 6.1–8.1)

## 2021-02-07 LAB — HIV-1 RNA QUANT-NO REFLEX-BLD
HIV 1 RNA Quant: NOT DETECTED Copies/mL
HIV-1 RNA Quant, Log: NOT DETECTED Log cps/mL

## 2021-02-07 LAB — RPR: RPR Ser Ql: NONREACTIVE

## 2021-02-09 NOTE — Progress Notes (Signed)
Chief Complaint:   OBESITY Julie Fox is here to discuss her progress with her obesity treatment plan along with follow-up of her obesity related diagnoses. Julie Fox is on keeping a food journal and adhering to recommended goals of 1800-1900 calories and 125+ g protein and states she is following her eating plan approximately ?% of the time. Julie Fox states she is walking 30 minutes 4 times per week.  Today's visit was #: 23 Starting weight: 283 lbs Starting date: 12/05/2019 Today's weight: 282 lbs Today's date: 02/04/2021 Total lbs lost to date: 1 Total lbs lost since last in-office visit: 6  Interim History: Julie Fox has had a fairly emotionally stressful last couple of weeks with a close friend being diagnosed with terminal end stage lung cancer. She hasn't necessarily eaten the healthiest and not enough. Calorie wise, she is ending up with 1500-1600 and protein has been limited (not close to goal). She didn't journal for five days completely.  Subjective:   1. Anxiety and depression Julie Fox denies suicidal or homicidal ideations. She is on Zoloft 25 mg. She is experiencing emotional distress with friend's recent cancer diagnosis. She doesn't feel she is doing well on Zoloft. She reports being quick tempered.   2. 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 Her last LFT's revealed ALT 176 and AST 109.   3. At risk for depression Julie Fox is at elevated risk of depression due to recent cancer diagnosis in friend.  Assessment/Plan:   1. Anxiety and depression Behavior modification techniques were discussed today to help Julie Fox deal with her anxiety.  Orders and follow up as documented in patient record. Continue Zoloft but increase to 50 mg PO daily, as prescribed below.  - sertraline (ZOLOFT) 50 MG tablet; Take 1 tablet (50 mg total) by mouth daily.  Dispense: 30 tablet; Refill: 0  2. Transaminitis Repeat labs in June.  3. At risk for depression Julie Fox was given  approximately 15 minutes of depression risk counseling today. She has risk factors for depression including the recent cancer diagnosis of her friend. We discussed the importance of a healthy work life balance, a healthy relationship with food and a good support system.  Repetitive spaced learning was employed today to elicit superior memory formation and behavioral change.  4. Class 3 severe obesity with serious comorbidity and body mass index (BMI) of 45.0 to 49.9 in adult, unspecified obesity type [facility]) Julie Fox is currently in the action stage of change. As such, her goal is to continue with weight loss efforts. She has agreed to keeping a food journal and adhering to recommended goals of 1800-1900 calories and 125+ g protein.   Exercise goals: As is  Behavioral modification strategies: increasing lean protein intake, meal planning and cooking strategies, keeping healthy foods in the home, emotional eating strategies, planning for success and keeping a strict food journal.  Julie Fox has agreed to follow-up with our clinic in 3 weeks. She was informed of the importance of frequent follow-up visits to maximize her success with intensive lifestyle modifications for her multiple health conditions.   Objective:   Blood pressure 127/79, pulse 67, temperature 98.1 F (36.7 C), height 5\' 8"  (1.727 m), weight 282 lb (127.9 kg), last menstrual period 12/26/2017, SpO2 98 %. Body mass index is 42.88 kg/m.  General: Cooperative, alert, well developed, in no acute distress. HEENT: Conjunctivae and lids unremarkable. Cardiovascular: Regular rhythm.  Lungs: Normal work of breathing. Neurologic: No focal deficits.   Lab Results  Component Value Date  CREATININE 0.90 02/04/2021   BUN 13 02/04/2021   NA 139 02/04/2021   K 4.0 02/04/2021   CL 103 02/04/2021   CO2 27 02/04/2021   Lab Results  Component Value Date   ALT 146 (H) 02/04/2021   AST 75 (H) 02/04/2021   ALKPHOS 129 (H) 11/20/2020    BILITOT 0.4 02/04/2021   Lab Results  Component Value Date   HGBA1C 6.3 (H) 11/20/2020   HGBA1C 5.9 (H) 04/22/2020   HGBA1C 6.0 (H) 12/05/2019   HGBA1C 5.8 06/20/2018   HGBA1C 5.7 06/13/2017   Lab Results  Component Value Date   INSULIN 32.3 (H) 11/20/2020   INSULIN 17.2 04/22/2020   INSULIN 20.8 12/05/2019   Lab Results  Component Value Date   TSH 2.850 12/05/2019   Lab Results  Component Value Date   CHOL 294 (H) 11/20/2020   HDL 43 11/20/2020   LDLCALC 210 (H) 11/20/2020   TRIG 211 (H) 11/20/2020   CHOLHDL 3.8 07/23/2020   Lab Results  Component Value Date   WBC 6.1 02/04/2021   HGB 14.2 02/04/2021   HCT 42.2 02/04/2021   MCV 94.2 02/04/2021   PLT 288 02/04/2021    Attestation Statements:   Reviewed by clinician on day of visit: allergies, medications, problem list, medical history, surgical history, family history, social history, and previous encounter notes.  Coral Ceo, am acting as transcriptionist for Coralie Common, MD.   I have reviewed the above documentation for accuracy and completeness, and I agree with the above. - Jinny Blossom, MD

## 2021-02-27 ENCOUNTER — Encounter (INDEPENDENT_AMBULATORY_CARE_PROVIDER_SITE_OTHER): Payer: Self-pay | Admitting: Family Medicine

## 2021-03-04 ENCOUNTER — Ambulatory Visit (INDEPENDENT_AMBULATORY_CARE_PROVIDER_SITE_OTHER): Payer: 59 | Admitting: Family Medicine

## 2021-03-25 ENCOUNTER — Ambulatory Visit (INDEPENDENT_AMBULATORY_CARE_PROVIDER_SITE_OTHER): Payer: 59 | Admitting: Family Medicine

## 2021-03-25 ENCOUNTER — Other Ambulatory Visit: Payer: Self-pay

## 2021-03-25 ENCOUNTER — Encounter (INDEPENDENT_AMBULATORY_CARE_PROVIDER_SITE_OTHER): Payer: Self-pay | Admitting: Family Medicine

## 2021-03-25 VITALS — BP 100/70 | HR 76 | Temp 98.4°F | Ht 68.0 in | Wt 288.0 lb

## 2021-03-25 DIAGNOSIS — Z6841 Body Mass Index (BMI) 40.0 and over, adult: Secondary | ICD-10-CM

## 2021-03-25 DIAGNOSIS — E559 Vitamin D deficiency, unspecified: Secondary | ICD-10-CM | POA: Diagnosis not present

## 2021-03-25 DIAGNOSIS — Z9189 Other specified personal risk factors, not elsewhere classified: Secondary | ICD-10-CM

## 2021-03-25 DIAGNOSIS — F32A Depression, unspecified: Secondary | ICD-10-CM

## 2021-03-25 DIAGNOSIS — F419 Anxiety disorder, unspecified: Secondary | ICD-10-CM

## 2021-03-25 DIAGNOSIS — R632 Polyphagia: Secondary | ICD-10-CM

## 2021-03-25 MED ORDER — PHENTERMINE HCL 8 MG PO TABS: 4.0000 mg | ORAL_TABLET | Freq: Every morning | ORAL | 0 refills | Status: DC

## 2021-03-25 MED ORDER — VITAMIN D (ERGOCALCIFEROL) 1.25 MG (50000 UNIT) PO CAPS: 50000.0000 [IU] | ORAL_CAPSULE | ORAL | 0 refills | Status: DC

## 2021-03-25 MED ORDER — SERTRALINE HCL 50 MG PO TABS: 50.0000 mg | ORAL_TABLET | Freq: Every day | ORAL | 0 refills | Status: DC

## 2021-03-25 MED ORDER — TOPIRAMATE 25 MG PO TABS: 25.0000 mg | ORAL_TABLET | Freq: Every morning | ORAL | 0 refills | Status: DC

## 2021-03-30 NOTE — Progress Notes (Signed)
Chief Complaint:   OBESITY Julie Fox is here to discuss her progress with her obesity treatment plan along with follow-up of her obesity related diagnoses. Julie Fox is on keeping a food journal and adhering to recommended goals of 1800-1900 calories and 125+ g protein and states she is following her eating plan approximately 80% of the time. Julie Fox states she is not currently exercising.  Today's visit was #: 24 Starting weight: 283 lbs Starting date: 12/05/2019 Today's weight: 288 lbs Today's date: 03/25/2021 Total lbs lost to date: 0 Total lbs lost since last in-office visit: 0  Interim History: RTC for the first time in 6 weeks. Julie Fox reports lots of work stress, mom's health issues, and dog's health issues. She feels frustrated and irritated with lack of weight loss. She previously did well on topiramate for migraines. Pt has not been as assertive with her logging as she wants to be.  Subjective:   1. Anxiety and depression Pt denies suicidal or homicidal ideations. Julie Fox is on Zoloft 50 mg. She reports some improvement with symptoms.  2. Polyphagia Well controlled on Wegovy previously but ran out of insurance coverage card. Julie Fox reports previous control with Topamax. PDMP checked. Control Substance Contract signed. S/p bilateral salpingo-oophorectomy.   3. Vitamin D deficiency Julie Fox denies nausea, vomiting, and muscle weakness but notes fatigue. Pt is on prescription Vit D.  4. At risk for side effect of medication Julie Fox is at risk for side effects of medication due to starting Qsymia.  Assessment/Plan:   1. Anxiety and depression Behavior modification techniques were discussed today to help Julie Fox deal with her anxiety.  Orders and follow up as documented in patient record. Behavior modification techniques were discussed today to help Julie Fox deal with her emotional/non-hunger eating behaviors.  Orders and follow up as documented in patient record.  - sertraline  (ZOLOFT) 50 MG tablet; Take 1 tablet (50 mg total) by mouth daily.  Dispense: 30 tablet; Refill: 0  2. Polyphagia Intensive lifestyle modifications are the first line treatment for this issue. We discussed several lifestyle modifications today and she will continue to work on diet, exercise and weight loss efforts. Orders and follow up as documented in patient record. -Start Phentermine 8 mg, as prescribed below. -Start topiramate 25 mg, as prescribed below.  Counseling Polyphagia is excessive hunger. Causes can include: low blood sugars, hypERthyroidism, PMS, lack of sleep, stress, insulin resistance, diabetes, certain medications, and diets that are deficient in protein and fiber.   - Phentermine HCl 8 MG TABS; Take 4 mg by mouth in the morning.  Dispense: 28 tablet; Refill: 0 - topiramate (TOPAMAX) 25 MG tablet; Take 1 tablet (25 mg total) by mouth in the morning.  Dispense: 30 tablet; Refill: 0  3. Vitamin D deficiency Low Vitamin D level contributes to fatigue and are associated with obesity, breast, and colon cancer. She agrees to continue to take prescription Vitamin D @50 ,000 IU every week and will follow-up for routine testing of Vitamin D, at least 2-3 times per year to avoid over-replacement. - Vitamin D, Ergocalciferol, (DRISDOL) 1.25 MG (50000 UNIT) CAPS capsule; Take 1 capsule (50,000 Units total) by mouth every 7 (seven) days.  Dispense: 8 capsule; Refill: 0  4. At risk for side effect of medication Julie Fox was given approximately 15 minutes of drug side effect counseling today.  We discussed side effect possibility and risk versus benefits. Julie Fox agreed to the medication and will contact this office if these side effects are intolerable.  Repetitive spaced  learning was employed today to elicit superior memory formation and behavioral change.  5. Class 3 severe obesity with serious comorbidity and body mass index (BMI) of 45.0 to 49.9 in adult, unspecified obesity type  [facility]) Julie Fox is currently in the action stage of change. As such, her goal is to continue with weight loss efforts. She has agreed to keeping a food journal and adhering to recommended goals of 1650-1800 calories and 125+ grams protein.   Exercise goals: No exercise has been prescribed at this time.  Behavioral modification strategies: increasing lean protein intake, meal planning and cooking strategies, keeping healthy foods in the home and planning for success.  Julie Fox has agreed to follow-up with our clinic in 3 weeks. She was informed of the importance of frequent follow-up visits to maximize her success with intensive lifestyle modifications for her multiple health conditions.   Objective:   Blood pressure 100/70, pulse 76, temperature 98.4 F (36.9 C), height 5\' 8"  (1.727 m), weight 288 lb (130.6 kg), last menstrual period 12/26/2017, SpO2 93 %. Body mass index is 43.79 kg/m.  General: Cooperative, alert, well developed, in no acute distress. HEENT: Conjunctivae and lids unremarkable. Cardiovascular: Regular rhythm.  Lungs: Normal work of breathing. Neurologic: No focal deficits.   Lab Results  Component Value Date   CREATININE 0.90 02/04/2021   BUN 13 02/04/2021   NA 139 02/04/2021   K 4.0 02/04/2021   CL 103 02/04/2021   CO2 27 02/04/2021   Lab Results  Component Value Date   ALT 146 (H) 02/04/2021   AST 75 (H) 02/04/2021   ALKPHOS 129 (H) 11/20/2020   BILITOT 0.4 02/04/2021   Lab Results  Component Value Date   HGBA1C 6.3 (H) 11/20/2020   HGBA1C 5.9 (H) 04/22/2020   HGBA1C 6.0 (H) 12/05/2019   HGBA1C 5.8 06/20/2018   HGBA1C 5.7 06/13/2017   Lab Results  Component Value Date   INSULIN 32.3 (H) 11/20/2020   INSULIN 17.2 04/22/2020   INSULIN 20.8 12/05/2019   Lab Results  Component Value Date   TSH 2.850 12/05/2019   Lab Results  Component Value Date   CHOL 294 (H) 11/20/2020   HDL 43 11/20/2020   LDLCALC 210 (H) 11/20/2020   TRIG 211 (H)  11/20/2020   CHOLHDL 3.8 07/23/2020   Lab Results  Component Value Date   WBC 6.1 02/04/2021   HGB 14.2 02/04/2021   HCT 42.2 02/04/2021   MCV 94.2 02/04/2021   PLT 288 02/04/2021   No results found for: IRON, TIBC, FERRITIN   Attestation Statements:   Reviewed by clinician on day of visit: allergies, medications, problem list, medical history, surgical history, family history, social history, and previous encounter notes.  Coral Ceo, CMA, am acting as transcriptionist for Coralie Common, MD.  I have reviewed the above documentation for accuracy and completeness, and I agree with the above. - Jinny Blossom, MD

## 2021-04-10 ENCOUNTER — Encounter (INDEPENDENT_AMBULATORY_CARE_PROVIDER_SITE_OTHER): Payer: Self-pay | Admitting: Family Medicine

## 2021-04-13 ENCOUNTER — Ambulatory Visit (INDEPENDENT_AMBULATORY_CARE_PROVIDER_SITE_OTHER): Payer: 59 | Admitting: Family Medicine

## 2021-04-13 ENCOUNTER — Other Ambulatory Visit: Payer: Self-pay

## 2021-04-13 ENCOUNTER — Encounter (INDEPENDENT_AMBULATORY_CARE_PROVIDER_SITE_OTHER): Payer: Self-pay | Admitting: Family Medicine

## 2021-04-13 VITALS — BP 114/80 | HR 85 | Temp 98.1°F | Ht 68.0 in | Wt 291.0 lb

## 2021-04-13 DIAGNOSIS — Z9189 Other specified personal risk factors, not elsewhere classified: Secondary | ICD-10-CM

## 2021-04-13 DIAGNOSIS — E559 Vitamin D deficiency, unspecified: Secondary | ICD-10-CM

## 2021-04-13 DIAGNOSIS — F32A Depression, unspecified: Secondary | ICD-10-CM

## 2021-04-13 DIAGNOSIS — Z6841 Body Mass Index (BMI) 40.0 and over, adult: Secondary | ICD-10-CM

## 2021-04-13 DIAGNOSIS — F419 Anxiety disorder, unspecified: Secondary | ICD-10-CM | POA: Diagnosis not present

## 2021-04-13 DIAGNOSIS — R632 Polyphagia: Secondary | ICD-10-CM | POA: Diagnosis not present

## 2021-04-13 MED ORDER — PHENTERMINE HCL 8 MG PO TABS: 8.0000 mg | ORAL_TABLET | Freq: Every morning | ORAL | 0 refills | Status: DC

## 2021-04-13 MED ORDER — SERTRALINE HCL 50 MG PO TABS: 50.0000 mg | ORAL_TABLET | Freq: Every day | ORAL | 0 refills | Status: DC

## 2021-04-13 MED ORDER — VITAMIN D (ERGOCALCIFEROL) 1.25 MG (50000 UNIT) PO CAPS: 50000.0000 [IU] | ORAL_CAPSULE | ORAL | 0 refills | Status: DC

## 2021-04-13 MED ORDER — TOPIRAMATE 50 MG PO TABS: 50.0000 mg | ORAL_TABLET | Freq: Every day | ORAL | 0 refills | Status: DC

## 2021-04-14 NOTE — Progress Notes (Signed)
Chief Complaint:   OBESITY Julie Fox is here to discuss her progress with her obesity treatment plan along with follow-up of her obesity related diagnoses. Reita is on keeping a food journal and adhering to recommended goals of 1650-1800 calories and 125 g protein and states she is following her eating plan approximately 75% of the time. Kameo states she is not currently exercising.  Today's visit was #: 25 Starting weight: 283 lbs Starting date: 12/05/2019 Today's weight: 291 lbs Today's date: 04/13/2021 Total lbs lost to date: 0 Total lbs lost since last in-office visit: 0  Interim History: Kadian is going on a cruise in 18 days and is concerned she may get COVID. She has noticed an improvement in her anxiety. Her friend died 2 weeks ago. She didn't exactly journal much last week due to being busy with life. Her biggest obstacle is finding time to actually journal.  Subjective:   1. Polyphagia Bryar is on Phentermine and Topamax. She not some better control with starting meds. PDMP checked- no concerns.  2. Anxiety and depression Kelda denies suicidal or homicidal ideations. She is on Zoloft with some improvement in symptoms.  3. Vitamin D deficiency Giabella denies nausea, vomiting, and muscle weakness but notes fatigue. She is on prescription Vit D.  4. At risk for side effect of medication Hetty is at risk for side effects of medication due to increasing dose of Phentermine and Topamax.   Assessment/Plan:   1. Polyphagia Intensive lifestyle modifications are the first line treatment for this issue. We discussed several lifestyle modifications today and she will continue to work on diet, exercise and weight loss efforts. Orders and follow up as documented in patient record. Increase Topamax to 50 mg and increase Phentermine to 8 mg, as written below.  Counseling Polyphagia is excessive hunger. Causes can include: low blood sugars, hypERthyroidism, PMS, lack of  sleep, stress, insulin resistance, diabetes, certain medications, and diets that are deficient in protein and fiber.   - Phentermine HCl 8 MG TABS; Take 8 mg by mouth in the morning.  Dispense: 28 tablet; Refill: 0 - topiramate (TOPAMAX) 50 MG tablet; Take 1 tablet (50 mg total) by mouth daily.  Dispense: 30 tablet; Refill: 0  2. Anxiety and depression Behavior modification techniques were discussed today to help Jenea deal with her anxiety.  Orders and follow up as documented in patient record. Behavior modification techniques were discussed today to help Elky deal with her emotional/non-hunger eating behaviors.  Orders and follow up as documented in patient record.   - sertraline (ZOLOFT) 50 MG tablet; Take 1 tablet (50 mg total) by mouth daily.  Dispense: 30 tablet; Refill: 0  3. Vitamin D deficiency Low Vitamin D level contributes to fatigue and are associated with obesity, breast, and colon cancer. She agrees to continue to take prescription Vitamin D @50 ,000 IU every week and will follow-up for routine testing of Vitamin D, at least 2-3 times per year to avoid over-replacement.  - Vitamin D, Ergocalciferol, (DRISDOL) 1.25 MG (50000 UNIT) CAPS capsule; Take 1 capsule (50,000 Units total) by mouth every 7 (seven) days.  Dispense: 8 capsule; Refill: 0  4. At risk for side effect of medication Amairani was given approximately 15 minutes of drug side effect counseling today.  We discussed side effect possibility and risk versus benefits. Slyvia agreed to the medication and will contact this office if these side effects are intolerable.  Repetitive spaced learning was employed today to elicit superior memory formation  and behavioral change.   5. Class 3 severe obesity with serious comorbidity and body mass index (BMI) of 45.0 to 49.9 in adult, unspecified obesity type [facility])  Freada is currently in the action stage of change. As such, her goal is to continue with weight loss efforts. She  has agreed to keeping a food journal and adhering to recommended goals of 1650-1800 calories and 125+ g protein.   Exercise goals: No exercise has been prescribed at this time.  Behavioral modification strategies: increasing lean protein intake, meal planning and cooking strategies, keeping healthy foods in the home, and planning for success.  Avonda has agreed to follow-up with our clinic in 3-4 weeks. She was informed of the importance of frequent follow-up visits to maximize her success with intensive lifestyle modifications for her multiple health conditions.   Objective:   Blood pressure 114/80, pulse 85, temperature 98.1 F (36.7 C), height 5\' 8"  (1.727 m), weight 291 lb (132 kg), last menstrual period 12/26/2017, SpO2 97 %. Body mass index is 44.25 kg/m.  General: Cooperative, alert, well developed, in no acute distress. HEENT: Conjunctivae and lids unremarkable. Cardiovascular: Regular rhythm.  Lungs: Normal work of breathing. Neurologic: No focal deficits.   Lab Results  Component Value Date   CREATININE 0.90 02/04/2021   BUN 13 02/04/2021   NA 139 02/04/2021   K 4.0 02/04/2021   CL 103 02/04/2021   CO2 27 02/04/2021   Lab Results  Component Value Date   ALT 146 (H) 02/04/2021   AST 75 (H) 02/04/2021   ALKPHOS 129 (H) 11/20/2020   BILITOT 0.4 02/04/2021   Lab Results  Component Value Date   HGBA1C 6.3 (H) 11/20/2020   HGBA1C 5.9 (H) 04/22/2020   HGBA1C 6.0 (H) 12/05/2019   HGBA1C 5.8 06/20/2018   HGBA1C 5.7 06/13/2017   Lab Results  Component Value Date   INSULIN 32.3 (H) 11/20/2020   INSULIN 17.2 04/22/2020   INSULIN 20.8 12/05/2019   Lab Results  Component Value Date   TSH 2.850 12/05/2019   Lab Results  Component Value Date   CHOL 294 (H) 11/20/2020   HDL 43 11/20/2020   LDLCALC 210 (H) 11/20/2020   TRIG 211 (H) 11/20/2020   CHOLHDL 3.8 07/23/2020   Lab Results  Component Value Date   WBC 6.1 02/04/2021   HGB 14.2 02/04/2021   HCT 42.2  02/04/2021   MCV 94.2 02/04/2021   PLT 288 02/04/2021   No results found for: IRON, TIBC, FERRITIN  Attestation Statements:   Reviewed by clinician on day of visit: allergies, medications, problem list, medical history, surgical history, family history, social history, and previous encounter notes.  Coral Ceo, CMA, am acting as transcriptionist for Coralie Common, MD.   I have reviewed the above documentation for accuracy and completeness, and I agree with the above. - Jinny Blossom, MD

## 2021-04-15 ENCOUNTER — Ambulatory Visit (INDEPENDENT_AMBULATORY_CARE_PROVIDER_SITE_OTHER): Payer: 59 | Admitting: Family Medicine

## 2021-04-27 ENCOUNTER — Other Ambulatory Visit: Payer: Self-pay | Admitting: Internal Medicine

## 2021-04-27 DIAGNOSIS — B20 Human immunodeficiency virus [HIV] disease: Secondary | ICD-10-CM

## 2021-05-11 ENCOUNTER — Ambulatory Visit (INDEPENDENT_AMBULATORY_CARE_PROVIDER_SITE_OTHER): Payer: 59 | Admitting: Family Medicine

## 2021-05-11 ENCOUNTER — Encounter (INDEPENDENT_AMBULATORY_CARE_PROVIDER_SITE_OTHER): Payer: Self-pay | Admitting: Family Medicine

## 2021-05-11 ENCOUNTER — Other Ambulatory Visit: Payer: Self-pay

## 2021-05-11 VITALS — BP 117/71 | HR 81 | Temp 97.9°F | Ht 68.0 in | Wt 290.0 lb

## 2021-05-11 DIAGNOSIS — F419 Anxiety disorder, unspecified: Secondary | ICD-10-CM | POA: Diagnosis not present

## 2021-05-11 DIAGNOSIS — R632 Polyphagia: Secondary | ICD-10-CM | POA: Diagnosis not present

## 2021-05-11 DIAGNOSIS — F32A Depression, unspecified: Secondary | ICD-10-CM | POA: Diagnosis not present

## 2021-05-11 DIAGNOSIS — Z6841 Body Mass Index (BMI) 40.0 and over, adult: Secondary | ICD-10-CM

## 2021-05-16 ENCOUNTER — Other Ambulatory Visit (INDEPENDENT_AMBULATORY_CARE_PROVIDER_SITE_OTHER): Payer: Self-pay | Admitting: Family Medicine

## 2021-05-16 DIAGNOSIS — F419 Anxiety disorder, unspecified: Secondary | ICD-10-CM

## 2021-05-16 DIAGNOSIS — F32A Depression, unspecified: Secondary | ICD-10-CM

## 2021-05-18 NOTE — Progress Notes (Signed)
Chief Complaint:   OBESITY Julie Fox is here to discuss her progress with her obesity treatment plan along with follow-up of her obesity related diagnoses. [name] is on keeping a food journal and adhering to recommended goals of 1650-1800 calories and 125+ g protein and states she is following her eating plan approximately 50-60% of the time. [name] states she is not currently exercising.  Today's visit was #: 63 Starting weight: 283 lbs Starting date: 12/05/2019 Today's weight: 290 lbs Today's date: 05/11/2021 Total lbs lost to date: 0 Total lbs lost since last in-office visit: 1  Interim History: [name] went on a cruise and did well food wise. She has no upcoming plans for the next few weeks. On current dose of Phentermine and Topamax, she did not eat at all for a few days. She is feeling frustrated with lack of progress. Pt has no upcoming plans.  Subjective:   1. Polyphagia [name] is taking 8 mg of Phentermine and 50 mg Topamax. She reports having signs of decreased drive to eat.  2. Anxiety and depression Pt denies suicidal or homicidal ideations. She is on Zoloft 50 mg by mouth daily.  Assessment/Plan:   1. Polyphagia Discontinue Phentermine and Topamax. Intensive lifestyle modifications are the first line treatment for this issue. We discussed several lifestyle modifications today and she will continue to work on diet, exercise and weight loss efforts. Orders and follow up as documented in patient record.  Counseling Polyphagia is excessive hunger. Causes can include: low blood sugars, hypERthyroidism, PMS, lack of sleep, stress, insulin resistance, diabetes, certain medications, and diets that are deficient in protein and fiber.   2. Anxiety and depression Behavior modification techniques were discussed today to help [name] deal with her anxiety.  Orders and follow up as documented in patient record. Continue sertraline. No refill needed at this time.  3. Class 3  severe obesity with serious comorbidity and body mass index (BMI) of 45.0 to 49.9 in adult, unspecified obesity type [facility])  [name] is currently in the action stage of change. As such, her goal is to continue with weight loss efforts. She has agreed to practicing portion control and making smarter food choices, such as increasing vegetables and decreasing simple carbohydrates.   Exercise goals: All adults should avoid inactivity. Some physical activity is better than none, and adults who participate in any amount of physical activity gain some health benefits.  Behavioral modification strategies: increasing lean protein intake, meal planning and cooking strategies, keeping healthy foods in the home, and planning for success.  [name] has agreed to follow-up with our clinic in [place]. She was informed of the importance of frequent follow-up visits to maximize her success with intensive lifestyle modifications for her multiple health conditions.   Objective:   Blood pressure 117/71, pulse 81, temperature 97.9 F (36.6 C), height '5\' 8"'$  (1.727 m), weight 290 lb (131.5 kg), last menstrual period 12/26/2017, SpO2 96 %. Body mass index is 44.09 kg/m.  General: Cooperative, alert, well developed, in no acute distress. HEENT: Conjunctivae and lids unremarkable. Cardiovascular: Regular rhythm.  Lungs: Normal work of breathing. Neurologic: No focal deficits.   Lab Results  Component Value Date   CREATININE 0.90 02/04/2021   BUN 13 02/04/2021   NA 139 02/04/2021   K 4.0 02/04/2021   CL 103 02/04/2021   CO2 27 02/04/2021   Lab Results  Component Value Date   ALT 146 (H) 02/04/2021   AST 75 (H) 02/04/2021   ALKPHOS 129 (H)  11/20/2020   BILITOT 0.4 02/04/2021   Lab Results  Component Value Date   HGBA1C 6.3 (H) 11/20/2020   HGBA1C 5.9 (H) 04/22/2020   HGBA1C 6.0 (H) 12/05/2019   HGBA1C 5.8 06/20/2018   HGBA1C 5.7 06/13/2017   Lab Results  Component Value Date   INSULIN 32.3 (H)  11/20/2020   INSULIN 17.2 04/22/2020   INSULIN 20.8 12/05/2019   Lab Results  Component Value Date   TSH 2.850 12/05/2019   Lab Results  Component Value Date   CHOL 294 (H) 11/20/2020   HDL 43 11/20/2020   LDLCALC 210 (H) 11/20/2020   TRIG 211 (H) 11/20/2020   CHOLHDL 3.8 07/23/2020   Lab Results  Component Value Date   VD25OH 15.0 (L) 11/20/2020   VD25OH 19.8 (L) 04/22/2020   VD25OH 13.2 (L) 12/05/2019   Lab Results  Component Value Date   WBC 6.1 02/04/2021   HGB 14.2 02/04/2021   HCT 42.2 02/04/2021   MCV 94.2 02/04/2021   PLT 288 02/04/2021    Attestation Statements:   Reviewed by clinician on day of visit: allergies, medications, problem list, medical history, surgical history, family history, social history, and previous encounter notes.  Coral Ceo, CMA, am acting as transcriptionist for Coralie Common, MD.   I have reviewed the above documentation for accuracy and completeness, and I agree with the above. - Coralie Common, MD

## 2021-05-24 ENCOUNTER — Other Ambulatory Visit: Payer: Self-pay | Admitting: Internal Medicine

## 2021-05-24 DIAGNOSIS — E785 Hyperlipidemia, unspecified: Secondary | ICD-10-CM

## 2021-05-25 NOTE — Telephone Encounter (Signed)
Please advise on refills.  

## 2021-08-03 ENCOUNTER — Encounter: Payer: Self-pay | Admitting: Internal Medicine

## 2021-08-03 ENCOUNTER — Other Ambulatory Visit: Payer: Self-pay

## 2021-08-03 ENCOUNTER — Ambulatory Visit: Payer: 59 | Admitting: Internal Medicine

## 2021-08-03 VITALS — BP 138/87 | HR 76 | Temp 98.0°F | Ht 68.0 in | Wt 299.0 lb

## 2021-08-03 DIAGNOSIS — B20 Human immunodeficiency virus [HIV] disease: Secondary | ICD-10-CM

## 2021-08-03 DIAGNOSIS — Z23 Encounter for immunization: Secondary | ICD-10-CM | POA: Diagnosis not present

## 2021-08-03 DIAGNOSIS — F32A Depression, unspecified: Secondary | ICD-10-CM

## 2021-08-03 DIAGNOSIS — F419 Anxiety disorder, unspecified: Secondary | ICD-10-CM | POA: Diagnosis not present

## 2021-08-03 MED ORDER — SERTRALINE HCL 50 MG PO TABS: 50.0000 mg | ORAL_TABLET | Freq: Every day | ORAL | 11 refills | Status: DC

## 2021-08-03 MED ORDER — TRIAMCINOLONE ACETONIDE 0.025 % EX OINT: 1.0000 "application " | TOPICAL_OINTMENT | Freq: Two times a day (BID) | CUTANEOUS | 0 refills | Status: DC

## 2021-08-03 NOTE — Progress Notes (Signed)
RFV: follow up for hiv disease  Patient ID: Julie Fox, female   DOB: [birth year], 45 y.o.   MRN: [id number]  HPI Julie Fox is a 45yo F with HIV disease, hx of anxiety, she reports that she has Misssed 2 months of meds in the last 6 months. Was on a cruise - but she lost her medication -- but then didn' restart. Since a lot of emotional stress. Loss in family - best friend lost 13yo daughter- "I lost my motivation since Julie Fox died" - June 05, 2023.  Taking intermittently - now off schedule - "off kilter"  Has not been taking amy medication.regularly. feels like she is agitated without zoloft    Outpatient Encounter Medications as of 08/03/2021  Medication Sig   atorvastatin (LIPITOR) 20 MG tablet TAKE 1 TABLET BY MOUTH EVERY DAY   elvitegravir-cobicistat-emtricitabine-tenofovir (GENVOYA) 150-150-200-10 MG TABS tablet Take 1 tablet by mouth daily with breakfast.   omeprazole (PRILOSEC) 40 MG capsule Take 40 mg by mouth daily.   sertraline (ZOLOFT) 50 MG tablet Take 1 tablet (50 mg total) by mouth daily.   Vitamin D, Ergocalciferol, (DRISDOL) 1.25 MG (50000 UNIT) CAPS capsule Take 1 capsule (50,000 Units total) by mouth every 7 (seven) days. (Patient not taking: Reported on 08/03/2021)   [DISCONTINUED] VALIUM 10 MG tablet Take 10 mg by mouth as directed.   No facility-administered encounter medications on file as of 08/03/2021.     Patient Active Problem List   Diagnosis Date Noted   Pelvic mass in female    Transaminitis 08/18/2020   Vitamin D deficiency 08/18/2020   Class 3 severe obesity with serious comorbidity and body mass index (BMI) of 40.0 to 44.9 in adult ([place]) 08/18/2020   Mild single current episode of major depressive disorder ([place]) 06/20/2018   Post-operative state 01/03/2018   Abnormal uterine bleeding (AUB) 12/12/2017   GAD (generalized anxiety disorder) 06/13/2017   Hyperlipidemia 06/13/2017   HIV disease ([place]) 12/23/2016     Health Maintenance Due   Topic Date Due   PAP SMEAR-Modifier  06/13/2020   COVID-19 Vaccine (4 - Booster for Julie Fox series) 11/15/2020   INFLUENZA VACCINE  05/25/2021     Review of Systems  Constitutional: Negative for fever, chills, diaphoresis, activity change, appetite change, fatigue and unexpected weight change.  HENT: Negative for congestion, sore throat, rhinorrhea, sneezing, trouble swallowing and sinus pressure.  Eyes: Negative for photophobia and visual disturbance.  Respiratory: Negative for cough, chest tightness, shortness of breath, wheezing and stridor.  Cardiovascular: Negative for chest pain, palpitations and leg swelling.  Gastrointestinal: Negative for nausea, vomiting, abdominal pain, diarrhea, constipation, blood in stool, abdominal distention and anal bleeding.  Genitourinary: Negative for dysuria, hematuria, flank pain and difficulty urinating.  Musculoskeletal: Negative for myalgias, back pain, joint swelling, arthralgias and gait problem.  Skin: Negative for color change, pallor, rash and wound.  Neurological: Negative for dizziness, tremors, weakness and light-headedness.  Hematological: Negative for adenopathy. Does not bruise/bleed easily.  Psychiatric/Behavioral: Negative for behavioral problems, confusion, sleep disturbance, dysphoric mood, decreased concentration and agitation.   Physical Exam   BP 138/87   Pulse 76   Temp 98 F (36.7 C) (Oral)   Ht 5\' 8"  (1.727 m)   Wt 299 lb (135.6 kg)   LMP 12/26/2017   SpO2 96%   BMI 45.46 kg/m   Physical Exam  Constitutional:  oriented to person, place, and time. appears well-developed and well-nourished. No distress.  HENT: [place]/AT, PERRLA, no scleral icterus Mouth/Throat: Oropharynx is clear  and moist. No oropharyngeal exudate.  Cardiovascular: Normal rate, regular rhythm and normal heart sounds. Exam reveals no gallop and no friction rub.  No murmur heard.  Pulmonary/Chest: Effort normal and breath sounds normal. No respiratory  distress.  has no wheezes.  Neck = supple, no nuchal rigidity Abdominal: Soft. Bowel sounds are normal.  exhibits no distension. There is no tenderness.  Lymphadenopathy: no cervical adenopathy. No axillary adenopathy Neurological: alert and oriented to person, place, and time.  Skin: Skin is warm and dry. No rash noted. No erythema.  Psychiatric: a normal mood and affect.  behavior is normal.   Lab Results  Component Value Date   CD4TCELL 55 02/04/2021   Lab Results  Component Value Date   CD4TABS 1,731 02/04/2021   CD4TABS 1,408 07/23/2020   CD4TABS 1,553 06/04/2019   Lab Results  Component Value Date   HIV1RNAQUANT Not Detected 02/04/2021   Lab Results  Component Value Date   HEPBSAB POS (A) 09/27/2016   Lab Results  Component Value Date   LABRPR NON-REACTIVE 02/04/2021    CBC Lab Results  Component Value Date   WBC 6.1 02/04/2021   RBC 4.48 02/04/2021   HGB 14.2 02/04/2021   HCT 42.2 02/04/2021   PLT 288 02/04/2021   MCV 94.2 02/04/2021   MCH 31.7 02/04/2021   MCHC 33.6 02/04/2021   RDW 12.3 02/04/2021   LYMPHSABS 3,062 02/04/2021   MONOABS 0.7 11/06/2017   EOSABS 256 02/04/2021    BMET Lab Results  Component Value Date   NA 139 02/04/2021   K 4.0 02/04/2021   CL 103 02/04/2021   CO2 27 02/04/2021   GLUCOSE 106 (H) 02/04/2021   BUN 13 02/04/2021   CREATININE 0.90 02/04/2021   CALCIUM 10.2 02/04/2021   GFRNONAA 78 02/04/2021   GFRAA 91 02/04/2021    Assessment and Plan  HIV disease = concern for drug resistance since intermittent taking her medication  Depression = would restart   Chest wall lesion = will try steroid to see if improves (itchy, raised, well circumscribed)  Flu shot today. Needs bivalent booster - to get at pharmacy

## 2021-08-04 LAB — T-HELPER CELL (CD4) - (RCID CLINIC ONLY)
CD4 % Helper T Cell: 53 % (ref 33–65)
CD4 T Cell Abs: 1375 /uL (ref 400–1790)

## 2021-08-13 LAB — COMPLETE METABOLIC PANEL WITH GFR
AG Ratio: 1.7 (calc) (ref 1.0–2.5)
ALT: 137 U/L — ABNORMAL HIGH (ref 6–29)
AST: 80 U/L — ABNORMAL HIGH (ref 10–30)
Albumin: 4.7 g/dL (ref 3.6–5.1)
Alkaline phosphatase (APISO): 131 U/L — ABNORMAL HIGH (ref 31–125)
BUN/Creatinine Ratio: 9 (calc) (ref 6–22)
BUN: 9 mg/dL (ref 7–25)
CO2: 31 mmol/L (ref 20–32)
Calcium: 10.5 mg/dL — ABNORMAL HIGH (ref 8.6–10.2)
Chloride: 101 mmol/L (ref 98–110)
Creat: 1.03 mg/dL — ABNORMAL HIGH (ref 0.50–0.99)
Globulin: 2.8 g/dL (calc) (ref 1.9–3.7)
Glucose, Bld: 112 mg/dL — ABNORMAL HIGH (ref 65–99)
Potassium: 4.5 mmol/L (ref 3.5–5.3)
Sodium: 139 mmol/L (ref 135–146)
Total Bilirubin: 0.3 mg/dL (ref 0.2–1.2)
Total Protein: 7.5 g/dL (ref 6.1–8.1)
eGFR: 69 mL/min/{1.73_m2} (ref >=60)

## 2021-08-13 LAB — LIPID PANEL
Cholesterol: 270 mg/dL — ABNORMAL HIGH (ref <200)
HDL: 50 mg/dL (ref >=50)
LDL Cholesterol (Calc): 184 mg/dL (calc) — ABNORMAL HIGH
Non-HDL Cholesterol (Calc): 220 mg/dL (calc) — ABNORMAL HIGH (ref <130)
Total CHOL/HDL Ratio: 5.4 (calc) — ABNORMAL HIGH (ref <5.0)
Triglycerides: 186 mg/dL — ABNORMAL HIGH (ref <150)

## 2021-08-13 LAB — CBC WITH DIFFERENTIAL/PLATELET
Absolute Monocytes: 281 cells/uL (ref 200–950)
Basophils Absolute: 59 cells/uL (ref 0–200)
Basophils Relative: 1.1 %
Eosinophils Absolute: 130 cells/uL (ref 15–500)
Eosinophils Relative: 2.4 %
HCT: 41.4 % (ref 35.0–45.0)
Hemoglobin: 13.9 g/dL (ref 11.7–15.5)
Lymphs Abs: 2495 cells/uL (ref 850–3900)
MCH: 31.1 pg (ref 27.0–33.0)
MCHC: 33.6 g/dL (ref 32.0–36.0)
MCV: 92.6 fL (ref 80.0–100.0)
MPV: 10.9 fL (ref 7.5–12.5)
Monocytes Relative: 5.2 %
Neutro Abs: 2435 cells/uL (ref 1500–7800)
Neutrophils Relative %: 45.1 %
Platelets: 315 10*3/uL (ref 140–400)
RBC: 4.47 10*6/uL (ref 3.80–5.10)
RDW: 12.3 % (ref 11.0–15.0)
Total Lymphocyte: 46.2 %
WBC: 5.4 10*3/uL (ref 3.8–10.8)

## 2021-08-13 LAB — HIV-1 RNA ULTRAQUANT REFLEX TO GENTYP+
HIV 1 RNA Quant: NOT DETECTED copies/mL
HIV-1 RNA Quant, Log: NOT DETECTED Log copies/mL

## 2021-08-13 LAB — HIV-1 INTEGRASE GENOTYPE

## 2021-08-13 LAB — RPR: RPR Ser Ql: NONREACTIVE

## 2021-08-19 ENCOUNTER — Other Ambulatory Visit: Payer: Self-pay | Admitting: Obstetrics & Gynecology

## 2021-08-19 DIAGNOSIS — Z1231 Encounter for screening mammogram for malignant neoplasm of breast: Secondary | ICD-10-CM

## 2021-09-02 ENCOUNTER — Other Ambulatory Visit: Payer: Self-pay

## 2021-09-02 ENCOUNTER — Ambulatory Visit: Payer: 59 | Admitting: Internal Medicine

## 2021-09-02 VITALS — BP 126/86 | HR 80 | Wt 298.0 lb

## 2021-09-02 DIAGNOSIS — R7401 Elevation of levels of liver transaminase levels: Secondary | ICD-10-CM

## 2021-09-02 DIAGNOSIS — B20 Human immunodeficiency virus [HIV] disease: Secondary | ICD-10-CM

## 2021-09-02 DIAGNOSIS — Z79899 Other long term (current) drug therapy: Secondary | ICD-10-CM | POA: Diagnosis not present

## 2021-09-02 NOTE — Progress Notes (Signed)
RFV: follow up for hiv disease  Patient ID: Julie Fox, female   DOB: 01/03/1976, 45 y.o.   MRN: [id number]  HPI Julie Fox is a 45yo F with well controlled HIV disease, anxiety who reports she is doing well. She has been having somstill some increased weight gain. She is worried about her laboratory results with AST/ALT mildly elevated at 80s/140s. She reports that she doesn't take advil, tylenol, no drinking. No episodes of jaundice. Abdominal pain with eating.  Outpatient Encounter Medications as of 09/02/2021  Medication Sig   atorvastatin (LIPITOR) 20 MG tablet TAKE 1 TABLET BY MOUTH EVERY DAY   elvitegravir-cobicistat-emtricitabine-tenofovir (GENVOYA) 150-150-200-10 MG TABS tablet Take 1 tablet by mouth daily with breakfast.   omeprazole (PRILOSEC) 40 MG capsule Take 40 mg by mouth daily.   sertraline (ZOLOFT) 50 MG tablet Take 1 tablet (50 mg total) by mouth daily.   triamcinolone (KENALOG) 0.025 % ointment Apply 1 application topically 2 (two) times daily.   Vitamin D, Ergocalciferol, (DRISDOL) 1.25 MG (50000 UNIT) CAPS capsule Take 1 capsule (50,000 Units total) by mouth every 7 (seven) days. (Patient not taking: No sig reported)   No facility-administered encounter medications on file as of 09/02/2021.     Patient Active Problem List   Diagnosis Date Noted   Pelvic mass in female    Transaminitis 08/18/2020   Vitamin D deficiency 08/18/2020   Class 3 severe obesity with serious comorbidity and body mass index (BMI) of 40.0 to 44.9 in adult [facility]) 08/18/2020   Mild single current episode of major depressive disorder ([place]) 06/20/2018   Post-operative state 01/03/2018   Abnormal uterine bleeding (AUB) 12/12/2017   GAD (generalized anxiety disorder) 06/13/2017   Hyperlipidemia 06/13/2017   HIV disease ([place]) 12/23/2016     Health Maintenance Due  Topic Date Due   PAP SMEAR-Modifier  06/13/2020   COVID-19 Vaccine (4 - Booster for [place] series) 10/18/2020     Review  of Systems Review of Systems  Constitutional: Negative for fever, chills, diaphoresis, activity change, appetite change, fatigue and unexpected weight change.  HENT: Negative for congestion, sore throat, rhinorrhea, sneezing, trouble swallowing and sinus pressure.  Eyes: Negative for photophobia and visual disturbance.  Respiratory: Negative for cough, chest tightness, shortness of breath, wheezing and stridor.  Cardiovascular: Negative for chest pain, palpitations and leg swelling.  Gastrointestinal: Negative for nausea, vomiting, abdominal pain, diarrhea, constipation, blood in stool, abdominal distention and anal bleeding.  Genitourinary: Negative for dysuria, hematuria, flank pain and difficulty urinating.  Musculoskeletal: Negative for myalgias, back pain, joint swelling, arthralgias and gait problem.  Skin: Negative for color change, pallor, rash and wound.  Neurological: Negative for dizziness, tremors, weakness and light-headedness.  Hematological: Negative for adenopathy. Does not bruise/bleed easily.  Psychiatric/Behavioral: Negative for behavioral problems, confusion, sleep disturbance, dysphoric mood, decreased concentration and agitation.   Physical Exam   BP 126/86   Pulse 80   Wt 298 lb (135.2 kg)   LMP 12/26/2017   BMI 45.31 kg/m   Physical Exam  Constitutional:  oriented to person, place, and time. appears well-developed and well-nourished. No distress.  HENT: /AT, PERRLA, no scleral icterus Mouth/Throat: Oropharynx is clear and moist. No oropharyngeal exudate.  Cardiovascular: Normal rate, regular rhythm and normal heart sounds. Exam reveals no gallop and no friction rub.  No murmur heard.  Pulmonary/Chest: Effort normal and breath sounds normal. No respiratory distress.  has no wheezes.  Neck = supple, no nuchal rigidity Abdominal: Soft. Bowel sounds are normal.  exhibits no distension. There is no tenderness.  Lymphadenopathy: no cervical adenopathy. No axillary  adenopathy Neurological: alert and oriented to person, place, and time.  Skin: Skin is warm and dry. No rash noted. No erythema.  Psychiatric: a normal mood and affect.  behavior is normal.   Lab Results  Component Value Date   CD4TCELL 53 08/03/2021   Lab Results  Component Value Date   CD4TABS 1,375 08/03/2021   CD4TABS 1,731 02/04/2021   CD4TABS 1,408 07/23/2020   Lab Results  Component Value Date   HIV1RNAQUANT NOT DETECTED 08/03/2021   Lab Results  Component Value Date   HEPBSAB POS (A) 09/27/2016   Lab Results  Component Value Date   LABRPR NON-REACTIVE 08/03/2021    CBC Lab Results  Component Value Date   WBC 5.4 08/03/2021   RBC 4.47 08/03/2021   HGB 13.9 08/03/2021   HCT 41.4 08/03/2021   PLT 315 08/03/2021   MCV 92.6 08/03/2021   MCH 31.1 08/03/2021   MCHC 33.6 08/03/2021   RDW 12.3 08/03/2021   LYMPHSABS 2,495 08/03/2021   MONOABS 0.7 11/06/2017   EOSABS 130 08/03/2021    BMET Lab Results  Component Value Date   NA 139 08/03/2021   K 4.5 08/03/2021   CL 101 08/03/2021   CO2 31 08/03/2021   GLUCOSE 112 (H) 08/03/2021   BUN 9 08/03/2021   CREATININE 1.03 (H) 08/03/2021   CALCIUM 10.5 (H) 08/03/2021   GFRNONAA 78 02/04/2021   GFRAA 91 02/04/2021      Assessment and Plan   Work up for transaminitis = getting ultrasound, suspect NASH hepatic steatosis. Will also check for auto-immune work up, and review any association with her medications HIV disease= well-controlled. Continue on genvoya Obesity = weight gain since hysterectomy. Considering to go back to weight management clinic Health maintenance = get covid bivalent on her time recommend to get it Needs pcp

## 2021-09-03 ENCOUNTER — Encounter (INDEPENDENT_AMBULATORY_CARE_PROVIDER_SITE_OTHER): Payer: Self-pay | Admitting: Family Medicine

## 2021-09-03 ENCOUNTER — Other Ambulatory Visit: Payer: 59

## 2021-09-03 LAB — SEDIMENTATION RATE: Sed Rate: 25 mm/h — ABNORMAL HIGH (ref 0–20)

## 2021-09-03 LAB — HEPATITIS B SURFACE ANTIBODY,QUALITATIVE: Hep B S Ab: REACTIVE — AB

## 2021-09-03 LAB — PROTIME-INR
INR: 1
Prothrombin Time: 10.3 s (ref 9.0–11.5)

## 2021-09-03 LAB — HEPATITIS C ANTIBODY
Hepatitis C Ab: NONREACTIVE
SIGNAL TO CUT-OFF: 0.09 (ref <1.00)

## 2021-09-03 LAB — ANA: Anti Nuclear Antibody (ANA): NEGATIVE

## 2021-09-09 ENCOUNTER — Ambulatory Visit (INDEPENDENT_AMBULATORY_CARE_PROVIDER_SITE_OTHER): Payer: 59 | Admitting: Family Medicine

## 2021-09-09 ENCOUNTER — Encounter (INDEPENDENT_AMBULATORY_CARE_PROVIDER_SITE_OTHER): Payer: Self-pay | Admitting: Family Medicine

## 2021-09-09 ENCOUNTER — Other Ambulatory Visit: Payer: Self-pay

## 2021-09-09 VITALS — BP 127/81 | HR 70 | Temp 98.2°F | Ht 68.0 in | Wt 299.0 lb

## 2021-09-09 DIAGNOSIS — R7303 Prediabetes: Secondary | ICD-10-CM | POA: Diagnosis not present

## 2021-09-09 DIAGNOSIS — R0602 Shortness of breath: Secondary | ICD-10-CM

## 2021-09-09 DIAGNOSIS — Z6841 Body Mass Index (BMI) 40.0 and over, adult: Secondary | ICD-10-CM | POA: Diagnosis not present

## 2021-09-09 NOTE — Progress Notes (Signed)
Chief Complaint:   OBESITY Julie Fox is here to discuss her progress with her obesity treatment plan along with follow-up of her obesity related diagnoses. Julie Fox is on practicing portion control and making smarter food choices, such as increasing vegetables and decreasing simple carbohydrates and states she is following her eating plan approximately 98% of the time. Julie Fox states she is going to the gym 45 minutes 4 times per week.  Today's visit was #: 62 Starting weight: 283 lbs Starting date: 12/05/2019 Today's weight: 299 lbs Today's date: 09/09/2021 Total lbs lost to date: 0 Total lbs lost since last in-office visit: 0  Interim History: Julie Fox has returned to the clinic after a 4 month hiatus. She is frustrated with weight gain since her last appt. She has a liver ultrasound scheduled. Pt has written down everything she has eaten for the last few months. She is working out 4-5 times a week. Calorie wise, she has been between 1700-1800 calories and 75 grams protein daily. Pt has not been weighing or measuring food.  Subjective:   1. Pre-diabetes Julie Fox last A1c was about a year ago. She is not on medication.  2. Shortness of breath on exertion Pt's symptoms have worsened since her initial appt.  Assessment/Plan:   1. Pre-diabetes Julie Fox will continue to work on weight loss, exercise, and decreasing simple carbohydrates to help decrease the risk of diabetes. Check labs at next appt.  2. Shortness of breath on exertion Julie Fox does feel that she gets out of breath more easily that she used to when she exercises. Julie Fox's shortness of breath appears to be obesity related and exercise induced. She has agreed to work on weight loss and gradually increase exercise to treat her exercise induced shortness of breath. Will continue to monitor closely. Recheck IC at next appt.  3. Class 3 severe obesity with serious comorbidity and body mass index (BMI) of 45.0 to 49.9 in adult,  unspecified obesity type [facility])  Julie Fox is currently in the action stage of change. As such, her goal is to continue with weight loss efforts. She has agreed to keeping a food journal and adhering to recommended goals of 1700-1800 calories and 140+ grams protein.   Exercise goals:  As is  Behavioral modification strategies: increasing lean protein intake, meal planning and cooking strategies, dealing with family or coworker sabotage, holiday eating strategies , and keeping a strict food journal.  Julie Fox has agreed to follow-up with our clinic in 3 weeks, fasting for labs and repeat IC. She was informed of the importance of frequent follow-up visits to maximize her success with intensive lifestyle modifications for her multiple health conditions.   Objective:   Blood pressure 127/81, pulse 70, temperature 98.2 F (36.8 C), height 5\' 8"  (1.727 m), weight 299 lb (135.6 kg), last menstrual period 12/26/2017, SpO2 96 %. Body mass index is 45.46 kg/m.  General: Cooperative, alert, well developed, in no acute distress. HEENT: Conjunctivae and lids unremarkable. Cardiovascular: Regular rhythm.  Lungs: Normal work of breathing. Neurologic: No focal deficits.   Lab Results  Component Value Date   CREATININE 1.03 (H) 08/03/2021   BUN 9 08/03/2021   NA 139 08/03/2021   K 4.5 08/03/2021   CL 101 08/03/2021   CO2 31 08/03/2021   Lab Results  Component Value Date   ALT 137 (H) 08/03/2021   AST 80 (H) 08/03/2021   ALKPHOS 129 (H) 11/20/2020   BILITOT 0.3 08/03/2021   Lab Results  Component Value Date  HGBA1C 6.3 (H) 11/20/2020   HGBA1C 5.9 (H) 04/22/2020   HGBA1C 6.0 (H) 12/05/2019   HGBA1C 5.8 06/20/2018   HGBA1C 5.7 06/13/2017   Lab Results  Component Value Date   INSULIN 32.3 (H) 11/20/2020   INSULIN 17.2 04/22/2020   INSULIN 20.8 12/05/2019   Lab Results  Component Value Date   TSH 2.850 12/05/2019   Lab Results  Component Value Date   CHOL 270 (H) 08/03/2021   HDL  50 08/03/2021   LDLCALC 184 (H) 08/03/2021   TRIG 186 (H) 08/03/2021   CHOLHDL 5.4 (H) 08/03/2021   Lab Results  Component Value Date   VD25OH 15.0 (L) 11/20/2020   VD25OH 19.8 (L) 04/22/2020   VD25OH 13.2 (L) 12/05/2019   Lab Results  Component Value Date   WBC 5.4 08/03/2021   HGB 13.9 08/03/2021   HCT 41.4 08/03/2021   MCV 92.6 08/03/2021   PLT 315 08/03/2021    Attestation Statements:   Reviewed by clinician on day of visit: allergies, medications, problem list, medical history, surgical history, family history, social history, and previous encounter notes.  Coral Ceo, CMA, am acting as transcriptionist for Coralie Common, MD.   I have reviewed the above documentation for accuracy and completeness, and I agree with the above. - Coralie Common, MD

## 2021-09-16 ENCOUNTER — Other Ambulatory Visit: Payer: 59

## 2021-09-16 ENCOUNTER — Ambulatory Visit (INDEPENDENT_AMBULATORY_CARE_PROVIDER_SITE_OTHER): Payer: 59 | Admitting: Obstetrics & Gynecology

## 2021-09-16 ENCOUNTER — Encounter: Payer: Self-pay | Admitting: Obstetrics & Gynecology

## 2021-09-16 ENCOUNTER — Encounter: Payer: Self-pay | Admitting: Internal Medicine

## 2021-09-16 ENCOUNTER — Other Ambulatory Visit: Payer: Self-pay

## 2021-09-16 VITALS — BP 128/60 | HR 78 | Ht 68.0 in | Wt 305.0 lb

## 2021-09-16 DIAGNOSIS — Z01419 Encounter for gynecological examination (general) (routine) without abnormal findings: Secondary | ICD-10-CM

## 2021-09-16 DIAGNOSIS — N951 Menopausal and female climacteric states: Secondary | ICD-10-CM

## 2021-09-16 NOTE — Progress Notes (Signed)
Patient has had full hysterectomy.Patient scheduled for mammogram next week in [place]. Kathrene Alu RN

## 2021-09-16 NOTE — Progress Notes (Signed)
Subjective:     Julie Fox is a 45 y.o. female here for a routine exam.  Current complaints: hot flushes. Pt does not want pharmacologic therapy.    Gynecologic History Patient's last menstrual period was 12/26/2017. Contraception: status post hysterectomy Last Pap: n/a.  Last mammogram: 07/18/2020. Results were: normal  Obstetric History OB History  Gravida Para Term Preterm AB Living  0 0 0 0 0 0  SAB IAB Ectopic Multiple Live Births  0 0 0 0 0   The following portions of the patient's history were reviewed and updated as appropriate: allergies, current medications, past family history, past medical history, past social history, past surgical history, and problem list.  Review of Systems Pertinent items are noted in HPI.    Objective:  BP 128/60   Pulse 78   Ht 5\' 8"  (1.727 m)   Wt (!) 305 lb (138.3 kg)   LMP 12/26/2017   BMI 46.38 kg/m   General Appearance:    Alert, cooperative, no distress, appears stated age  Head:    Normocephalic, without obvious abnormality, atraumatic  Eyes:    conjunctiva/corneas clear, EOM's intact, both eyes  Ears:    Normal external ear canals, both ears  Nose:   Nares normal, septum midline, mucosa normal, no drainage    or sinus tenderness  Throat:   Lips, mucosa, and tongue normal; teeth and gums normal  Neck:   Supple, symmetrical, trachea midline, no adenopathy;    thyroid:  no enlargement/tenderness/nodules  Back:     Symmetric, no curvature, ROM normal, no CVA tenderness  Lungs:     respirations unlabored  Chest Wall:    No tenderness or deformity   Heart:    Regular rate and rhythm  Breast Exam:    No tenderness, masses, or nipple abnormality  Abdomen:     Soft, non-tender, bowel sounds active all four quadrants,    no masses, no organomegaly  Genitalia:    Normal female without lesion, discharge or tenderness     Extremities:   Extremities normal, atraumatic, no cyanosis or edema  Pulses:   2+ and symmetric all  extremities  Skin:   Skin color, texture, turgor normal, no rashes or lesions     Assessment:    Healthy female exam.  Hot flushes. Pt declines pharmacologic   Plan:  Julie Fox was seen today for annual exam.  Diagnoses and all orders for this visit:  Well female exam with routine gynecological exam  Menopausal hot flushes   Pt has mammogram in 1 week.  Julie Fox, M.D., Cherlynn June

## 2021-09-21 ENCOUNTER — Telehealth: Payer: Self-pay

## 2021-09-21 ENCOUNTER — Ambulatory Visit
Admission: RE | Admit: 2021-09-21 | Discharge: 2021-09-21 | Disposition: A | Discharge status: Home / Self Care | Payer: 59 | Source: Ambulatory Visit

## 2021-09-21 ENCOUNTER — Other Ambulatory Visit (HOSPITAL_COMMUNITY): Payer: Self-pay

## 2021-09-21 DIAGNOSIS — Z1231 Encounter for screening mammogram for malignant neoplasm of breast: Secondary | ICD-10-CM

## 2021-09-21 NOTE — Telephone Encounter (Signed)
[facility] Patient Advocate Encounter   Received notification from [employer] that prior authorization for Kern Reap is required.   PA submitted on 09/21/2021 Key [id number] Status is pending    [facility] will continue to follow.   Ileene Patrick, [facility] Patient [facility] for Infectious Disease Phone: [phone] Fax:  [phone]

## 2021-09-21 NOTE — Telephone Encounter (Signed)
Julie Fox, patient requested appeal for his Cabenuva denial in January 2022. I am sure the window is long closed. Here is your note form January:   Received notification from [place] that the request for prior authorization for CABENUVA has been denied due to patient need to try & fail with other medications that is on the plan.    Best, Estill Bamberg

## 2021-09-22 ENCOUNTER — Telehealth: Payer: Self-pay

## 2021-09-22 NOTE — Telephone Encounter (Signed)
[facility] Patient Advocate Encounter  I started a PA for Cabenuva under patient medical benefits. I faxed viral loads to [phone].   PA # [id number]  Julie Fox, [place] Patient [facility] for Infectious Disease Phone: [phone] Fax:  [phone]

## 2021-09-28 ENCOUNTER — Ambulatory Visit
Admission: RE | Admit: 2021-09-28 | Discharge: 2021-09-28 | Disposition: A | Discharge status: Home / Self Care | Payer: 59 | Source: Ambulatory Visit | Attending: Internal Medicine | Admitting: Internal Medicine

## 2021-09-28 DIAGNOSIS — B20 Human immunodeficiency virus [HIV] disease: Secondary | ICD-10-CM

## 2021-10-07 ENCOUNTER — Ambulatory Visit (INDEPENDENT_AMBULATORY_CARE_PROVIDER_SITE_OTHER): Payer: 59 | Admitting: Family Medicine

## 2021-10-13 ENCOUNTER — Other Ambulatory Visit (HOSPITAL_COMMUNITY): Payer: Self-pay

## 2021-10-13 ENCOUNTER — Encounter: Payer: Self-pay | Admitting: Internal Medicine

## 2021-10-13 ENCOUNTER — Telehealth: Payer: Self-pay

## 2021-10-13 NOTE — Telephone Encounter (Signed)
Thanks Butch Penny!  Reached out to patient. She will come for first injection on 10/28/21.

## 2021-10-13 NOTE — Telephone Encounter (Signed)
[facility] Patient Advocate Encounter  Prior Authorization for Kern Reap has been approved.    PA# [id number] Effective dates: 09/22/21 through 02/2922  Medical Benefits Approval  Patient will have a office copay of $50.00 Patient is enrolled in [facility] will continue to follow.  Ileene Patrick, [facility] Patient [facility] for Infectious Disease Phone: [phone] Fax:  [phone]

## 2021-10-14 ENCOUNTER — Encounter: Payer: Self-pay | Admitting: Medical-Surgical

## 2021-10-14 ENCOUNTER — Ambulatory Visit: Payer: 59 | Admitting: Medical-Surgical

## 2021-10-14 VITALS — BP 144/88 | HR 108 | Resp 20 | Ht 68.0 in | Wt 305.7 lb

## 2021-10-14 DIAGNOSIS — R7303 Prediabetes: Secondary | ICD-10-CM

## 2021-10-14 DIAGNOSIS — Z7689 Persons encountering health services in other specified circumstances: Secondary | ICD-10-CM

## 2021-10-14 DIAGNOSIS — F320 Major depressive disorder, single episode, mild: Secondary | ICD-10-CM

## 2021-10-14 DIAGNOSIS — E785 Hyperlipidemia, unspecified: Secondary | ICD-10-CM

## 2021-10-14 DIAGNOSIS — E8881 Metabolic syndrome: Secondary | ICD-10-CM | POA: Diagnosis not present

## 2021-10-14 DIAGNOSIS — F411 Generalized anxiety disorder: Secondary | ICD-10-CM

## 2021-10-14 DIAGNOSIS — Z6841 Body Mass Index (BMI) 40.0 and over, adult: Secondary | ICD-10-CM

## 2021-10-14 DIAGNOSIS — R030 Elevated blood-pressure reading, without diagnosis of hypertension: Secondary | ICD-10-CM

## 2021-10-14 MED ORDER — OZEMPIC (0.25 OR 0.5 MG/DOSE) 2 MG/1.5ML ~~LOC~~ SOPN: 0.5000 mg | PEN_INJECTOR | SUBCUTANEOUS | 1 refills | Status: DC

## 2021-10-14 NOTE — Progress Notes (Signed)
New Patient Office Visit  Subjective:  Patient ID: Julie Fox, female    DOB: October 10, 1976  Age: 45 y.o. MRN: [id number]  CC:  Chief Complaint  Patient presents with   Establish Care     HPI Julie Fox presents to establish care. She is a pleasant 45 year old female who has been under the care of her ID provider and has not had a PCP in many years. Her ID provider has urged her to establish with a PCP which prompted her visit today. She is also seeing OB/GYN for her women's health needs. Was previously seeing [facility] but notes that she will not be returning to the for weight loss since she and the provider were not a good fit. Is still interested in weight loss as she knows this will only help with her health concerns. Was previously using Wegovy which was helping to lose weight but the discount card ran out and the [facility] provider did not appeal the denial for insurance coverage. She is interested in getting restarted on this as it had started working for her. Had done 3 months of 0.25mg  weekly then 3 months of 0.5mg  weekly. Tolerated the medication well without side effects.   She is followed by ID for HIV that has been a known issue for about 10  years. Doing well on the [facility] but reports that she will be switching to a different medication next month.   Taking Zoloft 50mg  daily for mood management. Has been tolerating it well and has no side effects. Feels that is works well to keep her mood stable. This has been prescribed by her ID provider.   Past Medical History:  Diagnosis Date   Allergy    Anxiety    Asthma    Chronic kidney disease    Depression    GAD (generalized anxiety disorder)    Gallbladder problem    GERD (gastroesophageal reflux disease)    no meds, diet controlled   Heartburn    High cholesterol    History of colon polyps    History of epilepsy    childhood seizures - last one age 75 yr, non since   HIV (human immunodeficiency virus infection) ([place])     Hx of abnormal cervical Pap smear    2012 per pt, then normal since    Migraine    otc med prn   Obesity    Pneumonia    Pure hypercholesterolemia    Seasonal allergies    Seizures ([place])    Juvenile epilepsy   Sleep apnea    Vitamin B12 deficiency    Vitamin D deficiency     Past Surgical History:  Procedure Laterality Date   ABDOMINAL HYSTERECTOMY  2019 & 2021   CHOLECYSTECTOMY     COLONOSCOPY     ROBOTIC ASSISTED SALPINGO OOPHERECTOMY N/A 10/02/2020   Procedure: XI ROBOTIC ASSISTED  BILATERAL SALPING-OOPHORECTOMY, LIVER BX;  Surgeon: Lafonda Mosses, MD;  Location: [facility];  Service: Gynecology;  Laterality: N/A;   TOE SURGERY     age 29-cyst excision per patient   TONSILLECTOMY  2016   UPPER GI ENDOSCOPY     VAGINAL HYSTERECTOMY Bilateral 01/03/2018   Procedure: HYSTERECTOMY VAGINAL WITH SALPINGECTOMY;  Surgeon: Lavonia Drafts, MD;  Location: [facility];  Service: Gynecology;  Laterality: Bilateral;   WISDOM TOOTH EXTRACTION     x 2 lower    Family History  Problem Relation Age of Onset   Hypertension Mother    Hyperlipidemia  Mother    Diabetes Mother    Heart disease Mother    Depression Mother    Anxiety disorder Mother    Sleep apnea Mother    Obesity Mother    Arthritis Mother    Liver cancer Father    Non-Hodgkin's lymphoma Maternal Grandmother    Cancer Maternal Grandmother    Leukemia Maternal Grandfather    Cancer Maternal Grandfather    Asthma Brother    Breast cancer Neg Hx    Ovarian cancer Neg Hx    Endometrial cancer Neg Hx    Pancreatic cancer Neg Hx    Colon cancer Neg Hx     Social History   Socioeconomic History   Marital status: Single    Spouse name: Not on file   Number of children: Not on file   Years of education: Not on file   Highest education level: Not on file  Occupational History   Not on file  Tobacco Use   Smoking status: Former    Packs/day: 0.25    Years: 20.00    Pack years: 5.00    Types:  Cigarettes    Start date: 12/24/1995    Quit date: 10/08/2020    Years since quitting: 1.0   Smokeless tobacco: Never   Tobacco comments:    quit 09/2020  Vaping Use   Vaping Use: Never used  Substance and Sexual Activity   Alcohol use: No   Drug use: No   Sexual activity: Not Currently    Partners: Male    Birth control/protection: None    Comment: offered condoms  Other Topics Concern   Not on file  Social History Narrative   Work or School: Engineer, structural, 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: lives with mother      Spiritual Beliefs:      Lifestyle: no regular exercise, diet not great   Social Determinants of Radio broadcast assistant Strain: Not on file  Food Insecurity: Not on file  Transportation Needs: Not on file  Physical Activity: Not on file  Stress: Not on file  Social Connections: Not on file  Intimate Partner Violence: Not on file    ROS Review of Systems  Constitutional:  Negative for chills, fatigue, fever and unexpected weight change.  Respiratory:  Negative for cough, chest tightness, shortness of breath and wheezing.   Cardiovascular:  Negative for chest pain, palpitations and leg swelling.  Neurological:  Negative for dizziness, light-headedness and headaches.  Psychiatric/Behavioral:  Negative for dysphoric mood, self-injury, sleep disturbance and suicidal ideas. The patient is not nervous/anxious.    Objective:   Today's Vitals: BP (!) 144/88 (BP Location: Right Arm, Patient Position: Sitting, Cuff Size: Large)    Pulse (!) 108    Resp 20    Ht 5\' 8"  (1.727 m)    Wt (!) 305 lb 11.2 oz (138.7 kg)    LMP 12/26/2017    SpO2 91%    BMI 46.48 kg/m   Physical Exam Vitals reviewed.  Constitutional:      General: She is not in acute distress.    Appearance: Normal appearance. She is obese. She is not ill-appearing.  HENT:     Head: Normocephalic and atraumatic.  Cardiovascular:     Rate and Rhythm: Normal rate and regular rhythm.     Pulses: Normal  pulses.     Heart sounds: Normal heart sounds. No murmur heard.   No friction rub. No gallop.  Pulmonary:  Effort: Pulmonary effort is normal. No respiratory distress.     Breath sounds: Normal breath sounds. No wheezing.  Skin:    General: Skin is warm and dry.  Neurological:     Mental Status: She is alert and oriented to person, place, and time.  Psychiatric:        Mood and Affect: Mood normal.        Behavior: Behavior normal.        Thought Content: Thought content normal.        Judgment: Judgment normal.    Assessment & Plan:   1. Encounter to establish care Reviewed available information and discussed care concerns with patient.   2. Metabolic syndrome 3. Prediabetes 4. Class 3 severe obesity with serious comorbidity and body mass index (BMI) of 45.0 to 49.9 in adult, unspecified obesity type (HCC) 5. Elevated blood pressure reading 6. Hyperlipidemia, unspecified hyperlipidemia type Restarting semaglutide pending insurance coverage. She would greatly benefit from the medication to help with weight loss but also to treat metabolic syndrome and prediabetes.  - Semaglutide,0.25 or 0.5MG /DOS, (OZEMPIC, 0.25 OR 0.5 MG/DOSE,) 2 MG/1.5ML SOPN; Inject 0.5 mg into the skin once a week.  Dispense: 1.5 mL; Refill: 1  7. Mild single current episode of major depressive disorder ([place]) 8. GAD (generalized anxiety disorder) Continue Zoloft 50mg  daily as prescribed. Will be happy to manage this if ID would like.  Outpatient Encounter Medications as of 10/14/2021  Medication Sig   atorvastatin (LIPITOR) 20 MG tablet TAKE 1 TABLET BY MOUTH EVERY DAY   elvitegravir-cobicistat-emtricitabine-tenofovir (GENVOYA) 150-150-200-10 MG TABS tablet Take 1 tablet by mouth daily with breakfast.   omeprazole (PRILOSEC) 40 MG capsule Take 40 mg by mouth daily.   Semaglutide,0.25 or 0.5MG /DOS, (OZEMPIC, 0.25 OR 0.5 MG/DOSE,) 2 MG/1.5ML SOPN Inject 0.5 mg into the skin once a week.   sertraline  (ZOLOFT) 50 MG tablet Take 1 tablet (50 mg total) by mouth daily.   Vitamin D, Ergocalciferol, (DRISDOL) 1.25 MG (50000 UNIT) CAPS capsule Take 1 capsule (50,000 Units total) by mouth every 7 (seven) days.   No facility-administered encounter medications on file as of 10/14/2021.   Follow-up: Return if symptoms worsen or fail to improve. Further follow up pending decision on Semaglutide approval.  Clearnce Sorrel, DNP, APRN, FNP-BC [facility]

## 2021-10-16 ENCOUNTER — Encounter: Payer: Self-pay | Admitting: Medical-Surgical

## 2021-10-28 ENCOUNTER — Other Ambulatory Visit: Payer: Self-pay

## 2021-10-28 ENCOUNTER — Ambulatory Visit (INDEPENDENT_AMBULATORY_CARE_PROVIDER_SITE_OTHER): Payer: 59 | Admitting: Pharmacist

## 2021-10-28 DIAGNOSIS — B20 Human immunodeficiency virus [HIV] disease: Secondary | ICD-10-CM | POA: Diagnosis not present

## 2021-10-28 MED ORDER — CABOTEGRAVIR & RILPIVIRINE ER 600 & 900 MG/3ML IM SUER
1.0000 | Freq: Once | INTRAMUSCULAR | Status: AC
  Administered 2021-10-28: 1 via INTRAMUSCULAR

## 2021-10-28 NOTE — Progress Notes (Signed)
HPI: Julie Fox is a 46 y.o. female who presents to the [place] [facility] for [facility] administration.  Patient Active Problem List   Diagnosis Date Noted   Pelvic mass in female    Transaminitis 08/18/2020   Vitamin D deficiency 08/18/2020   Class 3 severe obesity with serious comorbidity and body mass index (BMI) of 40.0 to 44.9 in adult [facility]) 08/18/2020   Mild single current episode of major depressive disorder ([place]) 06/20/2018   Post-operative state 01/03/2018   Abnormal uterine bleeding (AUB) 12/12/2017   GAD (generalized anxiety disorder) 06/13/2017   Hyperlipidemia 06/13/2017   HIV disease ([place]) 12/23/2016    Patient's Medications  New Prescriptions   No medications on file  Previous Medications   ATORVASTATIN (LIPITOR) 20 MG TABLET    TAKE 1 TABLET BY MOUTH EVERY DAY   ELVITEGRAVIR-COBICISTAT-EMTRICITABINE-TENOFOVIR (GENVOYA) 150-150-200-10 MG TABS TABLET    Take 1 tablet by mouth daily with breakfast.   OMEPRAZOLE (PRILOSEC) 40 MG CAPSULE    Take 40 mg by mouth daily.   SEMAGLUTIDE,0.25 OR 0.5MG /DOS, (OZEMPIC, 0.25 OR 0.5 MG/DOSE,) 2 MG/1.5ML SOPN    Inject 0.5 mg into the skin once a week.   SERTRALINE (ZOLOFT) 50 MG TABLET    Take 1 tablet (50 mg total) by mouth daily.   VITAMIN D, ERGOCALCIFEROL, (DRISDOL) 1.25 MG (50000 UNIT) CAPS CAPSULE    Take 1 capsule (50,000 Units total) by mouth every 7 (seven) days.  Modified Medications   No medications on file  Discontinued Medications   No medications on file    Allergies: Allergies  Allergen Reactions   Penicillins Anaphylaxis    Has patient had a PCN reaction causing immediate rash, facial/tongue/throat swelling, SOB or lightheadedness with hypotension: yes Has patient had a PCN reaction causing severe rash involving mucus membranes or skin necrosis: no Has patient had a PCN reaction that required hospitalization: no Has patient had a PCN reaction occurring within the last 10 years: yes If all of the  above answers are "NO", then may proceed with Cephalosporin use.    Sulfa Antibiotics Rash    Past Medical History: Past Medical History:  Diagnosis Date   Allergy    Anxiety    Asthma    Chronic kidney disease    Depression    GAD (generalized anxiety disorder)    Gallbladder problem    GERD (gastroesophageal reflux disease)    no meds, diet controlled   Heartburn    High cholesterol    History of colon polyps    History of epilepsy    childhood seizures - last one age 69 yr, non since   HIV (human immunodeficiency virus infection) ([place])    Hx of abnormal cervical Pap smear    2012 per pt, then normal since    Migraine    otc med prn   Obesity    Pneumonia    Pure hypercholesterolemia    Seasonal allergies    Seizures ([place])    Juvenile epilepsy   Sleep apnea    Vitamin B12 deficiency    Vitamin D deficiency     Social History: Social History   Socioeconomic History   Marital status: Single    Spouse name: Not on file   Number of children: Not on file   Years of education: Not on file   Highest education level: Not on file  Occupational History   Not on file  Tobacco Use   Smoking status: Former  Packs/day: 0.25    Years: 20.00    Pack years: 5.00    Types: Cigarettes    Start date: 12/24/1995    Quit date: 10/08/2020    Years since quitting: 1.0   Smokeless tobacco: Never   Tobacco comments:    quit 09/2020  Vaping Use   Vaping Use: Never used  Substance and Sexual Activity   Alcohol use: No   Drug use: No   Sexual activity: Not Currently    Partners: Male    Birth control/protection: None    Comment: offered condoms  Other Topics Concern   Not on file  Social History Narrative   Work or School: Engineer, structural, 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: lives with mother      Spiritual Beliefs:      Lifestyle: no regular exercise, diet not great   Social Determinants of Health   Financial Resource Strain: Not on file  Food Insecurity: Not on  file  Transportation Needs: Not on file  Physical Activity: Not on file  Stress: Not on file  Social Connections: Not on file    Labs: Lab Results  Component Value Date   HIV1RNAQUANT NOT DETECTED 08/03/2021   HIV1RNAQUANT Not Detected 02/04/2021   HIV1RNAQUANT <20 07/23/2020   CD4TABS 1,375 08/03/2021   CD4TABS 1,731 02/04/2021   CD4TABS 1,408 07/23/2020    RPR and STI Lab Results  Component Value Date   LABRPR NON-REACTIVE 08/03/2021   LABRPR NON-REACTIVE 02/04/2021   LABRPR NON-REACTIVE 07/23/2020   LABRPR NON-REACTIVE 06/04/2019   LABRPR NON-REACTIVE 12/27/2018    STI Results GC CT  12/27/2018 Negative Negative  09/27/2017 Negative Negative  09/27/2016 Negative Negative    Hepatitis B Lab Results  Component Value Date   HEPBSAB REACTIVE (A) 09/02/2021   HEPBSAG NON-REACTIVE 07/23/2020   HEPBCAB NON REACTIVE 09/27/2016   Hepatitis C Lab Results  Component Value Date   HEPCAB NON-REACTIVE 09/02/2021   Hepatitis A Lab Results  Component Value Date   HAV NON REACTIVE 09/27/2016   Lipids: Lab Results  Component Value Date   CHOL 270 (H) 08/03/2021   TRIG 186 (H) 08/03/2021   HDL 50 08/03/2021   CHOLHDL 5.4 (H) 08/03/2021   VLDL 26.2 06/20/2018   LDLCALC 184 (H) 08/03/2021    Current HIV Regimen: Genvoya  TARGET DATE: The 4th of the month   Assessment: Royce presents today for their first initiation injection for Cabenuva. Counseled that [place] is two separate intramuscular injections in the gluteal muscle on each side for each visit. Explained that the second injection is 30 days after the initial injection then every 2 months thereafter. Discussed the need for viral load monitoring every 2 months for the first 6 months and then periodically afterwards as their provider sees the need. Discussed the rare but significant chance of developing resistance despite compliance. Explained that showing up to injection appointments is very important and warned  that if 2 appointments are missed, it will be reassessed by their provider whether they are a good candidate for injection therapy. Counseled on possible side effects associated with the injections such as injection site pain, which is usually mild to moderate in nature, injection site nodules, and injection site reactions. Asked to call the clinic or send me a mychart message if they experience any issues, such as fatigue, nausea, headache, rash, or dizziness. Advised that they can take ibuprofen or tylenol for injection site pain if needed.   Administered cabotegravir 600mg /83mL in  left upper outer quadrant of the gluteal muscle. Administered rilpivirine 900 mg/63mL in the right upper outer quadrant of the gluteal muscle. Monitored patient for 10 minutes after injection. Injections were tolerated well without issue. Counseled to stop taking Genvoya after today's dose and to call with any issues that may arise. Will make follow up appointments for second initiation injection in 30 days and then maintenance injections every 2 months thereafter for 6 months.   Plan: - Stop Genvoya - First Cabenuva injections administered - Second initiation injection scheduled for 12/01/21 with me  - Maintenance injections scheduled for 01/26/33 with [name] and 03/30/22 with me - Call with any issues or questions  Jaikob Borgwardt L. Hendry Speas, PharmD, BCIDP, AAHIVP, [place] Clinical Pharmacist Practitioner [place] [facility]

## 2021-10-28 NOTE — Patient Instructions (Signed)
It was great seeing you today!   You received the long-acting injectable, Cabenuva, today. Injection site reactions are common with the first few injections. They are generally mild to moderate in nature and only last a few days. For the first 2-3 injections, take an OTC pain medication, such as Motrin or Tylenol, within a couple hours before your injection and continue as needed for a few days. You may also apply a warm compress or heating pad to the injection site for 15-20 minutes after the injection. Please try not to rub the injection site as this can disrupt the medication pocket in your muscle.   Other side effects, such as a nodule at the site of injection, pain, tenderness, swelling, or bruising can happen but are rare. Please let us know if you have any issues.  Cabenuva lasts a long time in your body but does not keep appropriate levels in your body for more than a few months. Please make all of your scheduled appointments and let us know ASAP if you have to miss an injection as you can develop resistant HIV virus or have a high viral load with missed injections.  Please send [place] a MyChart message if you have any questions. Have a great day!

## 2021-11-03 ENCOUNTER — Other Ambulatory Visit: Payer: Self-pay | Admitting: Internal Medicine

## 2021-11-03 ENCOUNTER — Telehealth: Payer: Self-pay

## 2021-11-03 ENCOUNTER — Encounter: Payer: Self-pay | Admitting: Internal Medicine

## 2021-11-03 DIAGNOSIS — E785 Hyperlipidemia, unspecified: Secondary | ICD-10-CM

## 2021-11-03 MED ORDER — ATORVASTATIN CALCIUM 20 MG PO TABS: 20.0000 mg | ORAL_TABLET | Freq: Every day | ORAL | 3 refills | Status: DC

## 2021-11-03 NOTE — Telephone Encounter (Signed)
Medication: Semaglutide,0.25 or 0.5MG /DOS, (OZEMPIC, 0.25 OR 0.5 MG/DOSE,) 2 MG/1.5ML SOPN Prior authorization submitted via CoverMyMeds on 11/03/2021 PA submission pending  Pt aware of PA status via MyChart message

## 2021-12-01 ENCOUNTER — Other Ambulatory Visit: Payer: Self-pay

## 2021-12-01 ENCOUNTER — Ambulatory Visit (INDEPENDENT_AMBULATORY_CARE_PROVIDER_SITE_OTHER): Payer: 59 | Admitting: Pharmacist

## 2021-12-01 DIAGNOSIS — Z79899 Other long term (current) drug therapy: Secondary | ICD-10-CM | POA: Diagnosis not present

## 2021-12-01 DIAGNOSIS — Z113 Encounter for screening for infections with a predominantly sexual mode of transmission: Secondary | ICD-10-CM

## 2021-12-01 DIAGNOSIS — B20 Human immunodeficiency virus [HIV] disease: Secondary | ICD-10-CM | POA: Diagnosis not present

## 2021-12-01 MED ORDER — CABOTEGRAVIR & RILPIVIRINE ER 600 & 900 MG/3ML IM SUER
1.0000 | Freq: Once | INTRAMUSCULAR | Status: AC
  Administered 2021-12-01: 1 via INTRAMUSCULAR

## 2021-12-01 NOTE — Progress Notes (Signed)
HPI: Julie Fox is a 46 y.o. female who presents to the [place] clinic for [place] administration.  Patient Active Problem List   Diagnosis Date Noted   Pelvic mass in female    Transaminitis 08/18/2020   Vitamin D deficiency 08/18/2020   Class 3 severe obesity with serious comorbidity and body mass index (BMI) of 40.0 to 44.9 in adult [facility]) 08/18/2020   Mild single current episode of major depressive disorder ([place]) 06/20/2018   Post-operative state 01/03/2018   Abnormal uterine bleeding (AUB) 12/12/2017   GAD (generalized anxiety disorder) 06/13/2017   Hyperlipidemia 06/13/2017   HIV disease ([place]) 12/23/2016    Patient's Medications  New Prescriptions   No medications on file  Previous Medications   ATORVASTATIN (LIPITOR) 20 MG TABLET    Take 1 tablet (20 mg total) by mouth daily.   OMEPRAZOLE (PRILOSEC) 40 MG CAPSULE    Take 40 mg by mouth daily.   SEMAGLUTIDE,0.25 OR 0.5MG /DOS, (OZEMPIC, 0.25 OR 0.5 MG/DOSE,) 2 MG/1.5ML SOPN    Inject 0.5 mg into the skin once a week.   SERTRALINE (ZOLOFT) 50 MG TABLET    Take 1 tablet (50 mg total) by mouth daily.   VITAMIN D, ERGOCALCIFEROL, (DRISDOL) 1.25 MG (50000 UNIT) CAPS CAPSULE    Take 1 capsule (50,000 Units total) by mouth every 7 (seven) days.  Modified Medications   No medications on file  Discontinued Medications   No medications on file    Allergies: Allergies  Allergen Reactions   Penicillins Anaphylaxis    Has patient had a PCN reaction causing immediate rash, facial/tongue/throat swelling, SOB or lightheadedness with hypotension: yes Has patient had a PCN reaction causing severe rash involving mucus membranes or skin necrosis: no Has patient had a PCN reaction that required hospitalization: no Has patient had a PCN reaction occurring within the last 10 years: yes If all of the above answers are "NO", then may proceed with Cephalosporin use.    Sulfa Antibiotics Rash    Past Medical History: Past  Medical History:  Diagnosis Date   Allergy    Anxiety    Asthma    Chronic kidney disease    Depression    GAD (generalized anxiety disorder)    Gallbladder problem    GERD (gastroesophageal reflux disease)    no meds, diet controlled   Heartburn    High cholesterol    History of colon polyps    History of epilepsy    childhood seizures - last one age 39 yr, non since   HIV (human immunodeficiency virus infection) ([place])    Hx of abnormal cervical Pap smear    2012 per pt, then normal since    Migraine    otc med prn   Obesity    Pneumonia    Pure hypercholesterolemia    Seasonal allergies    Seizures ([place])    Juvenile epilepsy   Sleep apnea    Vitamin B12 deficiency    Vitamin D deficiency     Social History: Social History   Socioeconomic History   Marital status: Single    Spouse name: Not on file   Number of children: Not on file   Years of education: Not on file   Highest education level: Not on file  Occupational History   Not on file  Tobacco Use   Smoking status: Former    Packs/day: 0.25    Years: 20.00    Pack years: 5.00    Types:  Cigarettes    Start date: 12/24/1995    Quit date: 10/08/2020    Years since quitting: 1.1   Smokeless tobacco: Never   Tobacco comments:    quit 09/2020  Vaping Use   Vaping Use: Never used  Substance and Sexual Activity   Alcohol use: No   Drug use: No   Sexual activity: Not Currently    Partners: Male    Birth control/protection: None    Comment: offered condoms  Other Topics Concern   Not on file  Social History Narrative   Work or School: Engineer, structural, 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: lives with mother      Spiritual Beliefs:      Lifestyle: no regular exercise, diet not great   Social Determinants of Health   Financial Resource Strain: Not on file  Food Insecurity: Not on file  Transportation Needs: Not on file  Physical Activity: Not on file  Stress: Not on file  Social Connections: Not on file     Labs: Lab Results  Component Value Date   HIV1RNAQUANT NOT DETECTED 08/03/2021   HIV1RNAQUANT Not Detected 02/04/2021   HIV1RNAQUANT <20 07/23/2020   CD4TABS 1,375 08/03/2021   CD4TABS 1,731 02/04/2021   CD4TABS 1,408 07/23/2020    RPR and STI Lab Results  Component Value Date   LABRPR NON-REACTIVE 08/03/2021   LABRPR NON-REACTIVE 02/04/2021   LABRPR NON-REACTIVE 07/23/2020   LABRPR NON-REACTIVE 06/04/2019   LABRPR NON-REACTIVE 12/27/2018    STI Results GC CT  12/27/2018 Negative Negative  09/27/2017 Negative Negative  09/27/2016 Negative Negative    Hepatitis B Lab Results  Component Value Date   HEPBSAB REACTIVE (A) 09/02/2021   HEPBSAG NON-REACTIVE 07/23/2020   HEPBCAB NON REACTIVE 09/27/2016   Hepatitis C Lab Results  Component Value Date   HEPCAB NON-REACTIVE 09/02/2021   Hepatitis A Lab Results  Component Value Date   HAV NON REACTIVE 09/27/2016   Lipids: Lab Results  Component Value Date   CHOL 270 (H) 08/03/2021   TRIG 186 (H) 08/03/2021   HDL 50 08/03/2021   CHOLHDL 5.4 (H) 08/03/2021   VLDL 26.2 06/20/2018   LDLCALC 184 (H) 08/03/2021    TARGET DATE: The 4th of the month  Assessment: Julie Fox presents today for their maintenance Cabenuva injections. Initial set of injections were tolerated well. She did experience mild pain on the left side for about a week. She also had diarrhea on Friday 2/3 that lasted all through the night, but she thinks it was food poisoning. No other issues. She asked for her blood pressure to be taken as she is a bit stressed because of work and her uncle passing away last weekend. BP was 138/83 and HR was 73.   Administered cabotegravir 600mg /22mL in left upper outer quadrant of the gluteal muscle. Administered rilpivirine 900 mg/7mL in the right upper outer quadrant of the gluteal muscle. Monitored patient for 10 minutes after injection. Injections were tolerated well without issue. Will check a HIV RNA and routine lab  work today, and she will follow up in 2 months with [name] for her next set of injections.  Plan: - Cabenuva injections administered - HIV RNA, CD4, CMET, CBC w diff, RPR, lipid panel today - Next injections scheduled for 01/26/22 with [name] and 03/30/22 with me - Call with any issues or questions  Bronda Alfred L. Dyanna Seiter, PharmD, BCIDP, AAHIVP, [place] Clinical Pharmacist Practitioner [place] [facility]

## 2021-12-02 NOTE — Progress Notes (Signed)
LFTs and alk phos continue to rise.

## 2021-12-03 LAB — HIV-1 RNA QUANT-NO REFLEX-BLD
HIV 1 RNA Quant: NOT DETECTED Copies/mL
HIV-1 RNA Quant, Log: NOT DETECTED Log cps/mL

## 2021-12-03 LAB — CBC WITH DIFFERENTIAL/PLATELET
Absolute Monocytes: 429 cells/uL (ref 200–950)
Basophils Absolute: 47 cells/uL (ref 0–200)
Basophils Relative: 0.7 %
Eosinophils Absolute: 201 cells/uL (ref 15–500)
Eosinophils Relative: 3 %
HCT: 41.1 % (ref 35.0–45.0)
Hemoglobin: 14 g/dL (ref 11.7–15.5)
Lymphs Abs: 3343 cells/uL (ref 850–3900)
MCH: 31.2 pg (ref 27.0–33.0)
MCHC: 34.1 g/dL (ref 32.0–36.0)
MCV: 91.5 fL (ref 80.0–100.0)
MPV: 11 fL (ref 7.5–12.5)
Monocytes Relative: 6.4 %
Neutro Abs: 2680 cells/uL (ref 1500–7800)
Neutrophils Relative %: 40 %
Platelets: 257 10*3/uL (ref 140–400)
RBC: 4.49 10*6/uL (ref 3.80–5.10)
RDW: 12.3 % (ref 11.0–15.0)
Total Lymphocyte: 49.9 %
WBC: 6.7 10*3/uL (ref 3.8–10.8)

## 2021-12-03 LAB — COMPREHENSIVE METABOLIC PANEL
AG Ratio: 1.4 (calc) (ref 1.0–2.5)
ALT: 206 U/L — ABNORMAL HIGH (ref 6–29)
AST: 119 U/L — ABNORMAL HIGH (ref 10–30)
Albumin: 4.3 g/dL (ref 3.6–5.1)
Alkaline phosphatase (APISO): 144 U/L — ABNORMAL HIGH (ref 31–125)
BUN: 10 mg/dL (ref 7–25)
CO2: 28 mmol/L (ref 20–32)
Calcium: 10 mg/dL (ref 8.6–10.2)
Chloride: 104 mmol/L (ref 98–110)
Creat: 0.68 mg/dL (ref 0.50–0.99)
Globulin: 3 g/dL (calc) (ref 1.9–3.7)
Glucose, Bld: 112 mg/dL — ABNORMAL HIGH (ref 65–99)
Potassium: 4.2 mmol/L (ref 3.5–5.3)
Sodium: 140 mmol/L (ref 135–146)
Total Bilirubin: 0.4 mg/dL (ref 0.2–1.2)
Total Protein: 7.3 g/dL (ref 6.1–8.1)

## 2021-12-03 LAB — LIPID PANEL
Cholesterol: 221 mg/dL — ABNORMAL HIGH (ref <200)
HDL: 44 mg/dL — ABNORMAL LOW (ref >=50)
LDL Cholesterol (Calc): 148 mg/dL (calc) — ABNORMAL HIGH
Non-HDL Cholesterol (Calc): 177 mg/dL (calc) — ABNORMAL HIGH (ref <130)
Total CHOL/HDL Ratio: 5 (calc) — ABNORMAL HIGH (ref <5.0)
Triglycerides: 154 mg/dL — ABNORMAL HIGH (ref <150)

## 2021-12-03 LAB — T-HELPER CELLS (CD4) COUNT (NOT AT ARMC)
Absolute CD4: 1667 cells/uL (ref 490–1740)
CD4 T Helper %: 51 % (ref 30–61)
Total lymphocyte count: 3240 cells/uL (ref 850–3900)

## 2021-12-03 LAB — RPR: RPR Ser Ql: NONREACTIVE

## 2021-12-07 ENCOUNTER — Ambulatory Visit: Payer: 59 | Admitting: Internal Medicine

## 2022-01-26 ENCOUNTER — Other Ambulatory Visit: Payer: Self-pay

## 2022-01-26 ENCOUNTER — Other Ambulatory Visit (HOSPITAL_COMMUNITY): Payer: Self-pay

## 2022-01-26 ENCOUNTER — Encounter: Payer: Self-pay | Admitting: Internal Medicine

## 2022-01-26 ENCOUNTER — Ambulatory Visit (INDEPENDENT_AMBULATORY_CARE_PROVIDER_SITE_OTHER): Payer: 59 | Admitting: Internal Medicine

## 2022-01-26 ENCOUNTER — Telehealth: Payer: Self-pay

## 2022-01-26 VITALS — BP 133/86 | HR 92 | Resp 16 | Ht 68.0 in | Wt 306.0 lb

## 2022-01-26 DIAGNOSIS — R1902 Left upper quadrant abdominal swelling, mass and lump: Secondary | ICD-10-CM | POA: Diagnosis not present

## 2022-01-26 DIAGNOSIS — E669 Obesity, unspecified: Secondary | ICD-10-CM

## 2022-01-26 DIAGNOSIS — B20 Human immunodeficiency virus [HIV] disease: Secondary | ICD-10-CM

## 2022-01-26 DIAGNOSIS — R7303 Prediabetes: Secondary | ICD-10-CM

## 2022-01-26 DIAGNOSIS — E8881 Metabolic syndrome: Secondary | ICD-10-CM

## 2022-01-26 DIAGNOSIS — E785 Hyperlipidemia, unspecified: Secondary | ICD-10-CM

## 2022-01-26 DIAGNOSIS — Z6830 Body mass index (BMI) 30.0-30.9, adult: Secondary | ICD-10-CM

## 2022-01-26 DIAGNOSIS — R030 Elevated blood-pressure reading, without diagnosis of hypertension: Secondary | ICD-10-CM

## 2022-01-26 MED ORDER — CABOTEGRAVIR & RILPIVIRINE ER 600 & 900 MG/3ML IM SUER
1.0000 | Freq: Once | INTRAMUSCULAR | Status: AC
  Administered 2022-01-26: 1 via INTRAMUSCULAR

## 2022-01-26 MED ORDER — OZEMPIC (0.25 OR 0.5 MG/DOSE) 2 MG/3ML ~~LOC~~ SOPN
0.2500 mg | PEN_INJECTOR | SUBCUTANEOUS | 0 refills | Status: AC
  Filled 2022-01-26: qty 3, 56d supply, fill #0

## 2022-01-26 NOTE — Telephone Encounter (Addendum)
[facility] Patient Advocate Encounter ?  ?Received notification from [employer] that prior authorization for Mancel Parsons is required. ?  ?PA submitted on 01/26/22 ?Key [id number] ?Status is pending ? ? ?DENIED ?   ?[facility] will continue to follow. ? ? ?Ileene Patrick, CPhT ?Specialty Pharmacy Patient Advocate ?[facility] ?Phone: [phone] ?Fax:  [phone]  ?

## 2022-01-26 NOTE — Progress Notes (Signed)
?RFV: cabenuva injection for hiv disease ? ?Patient ID: Julie Fox, female   DOB: 06/09/1976, 46 y.o.   MRN: [id number] ? ?HPI ?Julie Fox is a 46yo F with well controlled hiv disease, anxiety, HLD, who is here for 3rd injection of cabenuva (Q2 month administration). Also being worked up for elevated ALP and transaminitis. AST/ALT 100s/200s ? ?1st dose was painful and discomfort x 3 weeks ? ?Doesn't take tylenol. Noticing left sided "bump" to abdominal wall that has emerged over the last month. Nontender or inflamed. Occasional discomfort ? ? ?Outpatient Encounter Medications as of 01/26/2022  ?Medication Sig  ? atorvastatin (LIPITOR) 20 MG tablet Take 1 tablet (20 mg total) by mouth daily.  ? omeprazole (PRILOSEC) 40 MG capsule Take 40 mg by mouth daily.  ? Semaglutide,0.25 or 0.'5MG'$ /DOS, (OZEMPIC, 0.25 OR 0.5 MG/DOSE,) 2 MG/1.5ML SOPN Inject 0.5 mg into the skin once a week.  ? sertraline (ZOLOFT) 50 MG tablet Take 1 tablet (50 mg total) by mouth daily.  ? Vitamin D, Ergocalciferol, (DRISDOL) 1.25 MG (50000 UNIT) CAPS capsule Take 1 capsule (50,000 Units total) by mouth every 7 (seven) days.  ? ?No facility-administered encounter medications on file as of 01/26/2022.  ?  ? ?Patient Active Problem List  ? Diagnosis Date Noted  ? Pelvic mass in female   ? Transaminitis 08/18/2020  ? Vitamin D deficiency 08/18/2020  ? Class 3 severe obesity with serious comorbidity and body mass index (BMI) of 40.0 to 44.9 in adult [facility]) 08/18/2020  ? Mild single current episode of major depressive disorder ([place]) 06/20/2018  ? Post-operative state 01/03/2018  ? Abnormal uterine bleeding (AUB) 12/12/2017  ? GAD (generalized anxiety disorder) 06/13/2017  ? Hyperlipidemia 06/13/2017  ? HIV disease ([place]) 12/23/2016  ? ? ? ?Health Maintenance Due  ?Topic Date Due  ? PAP SMEAR-Modifier  06/13/2020  ? COVID-19 Vaccine (4 - Booster for Pfizer series) 10/18/2020  ?  ? ?Review of Systems ?Review of Systems  ?Constitutional: Negative for  fever, chills, diaphoresis, activity change, appetite change, fatigue and unexpected weight change.  ?HENT: Negative for congestion, sore throat, rhinorrhea, sneezing, trouble swallowing and sinus pressure.  ?Eyes: Negative for photophobia and visual disturbance.  ?Respiratory: Negative for cough, chest tightness, shortness of breath, wheezing and stridor.  ?Cardiovascular: Negative for chest pain, palpitations and leg swelling.  ?Gastrointestinal: Negative for nausea, vomiting, abdominal pain, diarrhea, constipation, blood in stool, abdominal distention and anal bleeding.  ?Genitourinary: Negative for dysuria, hematuria, flank pain and difficulty urinating.  ?Musculoskeletal: Negative for myalgias, back pain, joint swelling, arthralgias and gait problem.  ?Skin: Negative for color change, pallor, rash and wound.  ?Neurological: Negative for dizziness, tremors, weakness and light-headedness.  ?Hematological: Negative for adenopathy. Does not bruise/bleed easily.  ?Psychiatric/Behavioral: Negative for behavioral problems, confusion, sleep disturbance, dysphoric mood, decreased concentration and agitation.  ? ?Physical Exam  ? ?LMP 12/26/2017   ?Physical Exam  ?Constitutional:  oriented to person, place, and time. appears well-developed and well-nourished. No distress.  ?HENT: [place]/AT, PERRLA, no scleral icterus ?Mouth/Throat: Oropharynx is clear and moist. No oropharyngeal exudate.  ?Cardiovascular: Normal rate, regular rhythm and normal heart sounds. Exam reveals no gallop and no friction rub.  ?No murmur heard.  ?Pulmonary/Chest: Effort normal and breath sounds normal. No respiratory distress.  has no wheezes.  ?Neck = supple, no nuchal rigidity ?Abdominal: Soft. Bowel sounds are normal.  exhibits no distension. New lesion- slightly indurated on LUQ measuring 6 inch x 1 inch. ?Lymphadenopathy: no cervical adenopathy. No axillary adenopathy ?Neurological:  alert and oriented to person, place, and time.  ?Skin: Skin is  warm and dry. No rash noted. No erythema.  ?Psychiatric: a normal mood and affect.  behavior is normal.  ? ?Lab Results  ?Component Value Date  ? CD4TCELL 51 12/01/2021  ? ?Lab Results  ?Component Value Date  ? CD4TABS 1,375 08/03/2021  ? CD4TABS 1,731 02/04/2021  ? CD4TABS 1,408 07/23/2020  ? ?Lab Results  ?Component Value Date  ? HIV1RNAQUANT Not Detected 12/01/2021  ? ?Lab Results  ?Component Value Date  ? HEPBSAB REACTIVE (A) 09/02/2021  ? ?Lab Results  ?Component Value Date  ? LABRPR NON-REACTIVE 12/01/2021  ? ? ?CBC ?Lab Results  ?Component Value Date  ? WBC 6.7 12/01/2021  ? RBC 4.49 12/01/2021  ? HGB 14.0 12/01/2021  ? HCT 41.1 12/01/2021  ? PLT 257 12/01/2021  ? MCV 91.5 12/01/2021  ? MCH 31.2 12/01/2021  ? MCHC 34.1 12/01/2021  ? RDW 12.3 12/01/2021  ? LYMPHSABS 3,343 12/01/2021  ? MONOABS 0.7 11/06/2017  ? EOSABS 201 12/01/2021  ? ? ?BMET ?Lab Results  ?Component Value Date  ? NA 140 12/01/2021  ? K 4.2 12/01/2021  ? CL 104 12/01/2021  ? CO2 28 12/01/2021  ? GLUCOSE 112 (H) 12/01/2021  ? BUN 10 12/01/2021  ? CREATININE 0.68 12/01/2021  ? CALCIUM 10.0 12/01/2021  ? GFRNONAA 78 02/04/2021  ? GFRAA 91 02/04/2021  ? ? ? ? ?Assessment and Plan ? ?Abdominal wall mass= unclear if lipoma, appears firmer on exam. Will order Abd ct to investigate abdominal mass ? ?Pre-diabetes= will try to see if can get approval for wegovy since her insurance declined ozempic ? ?HIV disease= continue with cabenuva, to get injection today. Will check labs at next visit ? ?Obesity with bmi: 30 = continue with improved diet and exercise ? ?

## 2022-01-27 ENCOUNTER — Other Ambulatory Visit (HOSPITAL_COMMUNITY): Payer: Self-pay

## 2022-01-28 ENCOUNTER — Telehealth: Payer: Self-pay

## 2022-01-28 ENCOUNTER — Other Ambulatory Visit (HOSPITAL_COMMUNITY): Payer: Self-pay

## 2022-01-28 NOTE — Telephone Encounter (Signed)
[facility] Patient Advocate Encounter ?  ?Received notification from [facility] that prior authorization for [facility] is required. ?  ?PA submitted on 01/28/22 ?Key [id number] ?Status is pending ?   ?[facility] will continue to follow. ? ? ?Ileene Patrick, CPhT ?Specialty Pharmacy Patient Advocate ?[facility] for Infectious Disease ?Phone: [phone] ?Fax:  [phone]  ?

## 2022-01-28 NOTE — Telephone Encounter (Signed)
[facility] Patient Advocate Encounter ? ?Received notification from [facility] that the request for prior authorization for Saxenda has been denied due to weigh loss medication is excluded from the plan.   ?  ?I have tried [facility] & Ozempic which was denied as well.  ?Patient is not able to get patient assistance for Ozempic because she have insurance. There is no patient assistance for [facility] or Saxenda , they only have copay coupon cards which the insurance have to pay for before the copay card works. ? ?This encounter will continue to be updated until final determination.   ? ?Ileene Patrick, CPhT ?Specialty Pharmacy Patient Advocate ?[facility] for Infect[facility] Disease ?Phone: [phone] ?Fax:  [phone]  ?

## 2022-01-28 NOTE — Telephone Encounter (Signed)
FYI [name] - her [place] has elected to not cover any medications for weight loss so everything is being denied. Butch Penny has tried all avenues, unfortunately.

## 2022-02-01 ENCOUNTER — Telehealth: Payer: Self-pay

## 2022-02-01 ENCOUNTER — Encounter: Payer: Self-pay | Admitting: Internal Medicine

## 2022-02-01 NOTE — Telephone Encounter (Signed)
Per [name], NP offered patient appointment to come in on Wednesday to be evaluated since pain has worsened since appointment on 4/4 with [name] and to possibly see about getting CT of abdomen scheduled sooner - patient declined. Patient denied having SOB, cough, URI symptoms. Just shooting pain below left rib cage. Advised patient she could also contact PCP, go to UC or ED. Patient stated that she would go to ED in [facility]. I voiced my understanding and we ended the call.  ? ? ? ? ? ?Julie Fox  P [facility] Clinical Pool (supporting Carlyle Basques, MD) 39 minutes ago (2:48 PM)  ? ?Sorry if you need to call I?m free now.   ?  ? ?Julie Fox  P [facility] Clinical Pool (supporting Carlyle Basques, MD) 40 minutes ago (2:47 PM)  ? ?Just a sharper shooting pain if I take a deep breath in.  Other than that not really.  ?  ? ?You  Julie Fox 48 minutes ago (2:39 PM)  ? ?Hi [name], ?  ?Are you having any other symptoms besides the chest/shoulder pressure?  ?  ?  ? ?Julie Fox  P [facility] Clinical Pool (supporting Carlyle Basques, MD) 51 minutes ago (2:36 PM)  ? ?So Dr [name] has sent me to have a CT scan that is scheduled for 4/28.  I have been experiencing a consistent pressure under my left rib area and into my shoulder over the weekend.  Is this something I should be concerned with ?

## 2022-02-04 ENCOUNTER — Encounter: Payer: Self-pay | Admitting: Internal Medicine

## 2022-02-12 ENCOUNTER — Ambulatory Visit: Payer: 59 | Admitting: Medical-Surgical

## 2022-02-15 ENCOUNTER — Encounter: Payer: Self-pay | Admitting: Internal Medicine

## 2022-02-15 DIAGNOSIS — R1902 Left upper quadrant abdominal swelling, mass and lump: Secondary | ICD-10-CM

## 2022-02-19 ENCOUNTER — Ambulatory Visit
Admission: RE | Admit: 2022-02-19 | Discharge: 2022-02-19 | Disposition: A | Discharge status: Home / Self Care | Payer: 59 | Source: Ambulatory Visit | Attending: Internal Medicine | Admitting: Internal Medicine

## 2022-02-19 DIAGNOSIS — R1902 Left upper quadrant abdominal swelling, mass and lump: Secondary | ICD-10-CM

## 2022-02-19 MED ORDER — IOPAMIDOL (ISOVUE-300) INJECTION 61%
100.0000 mL | Freq: Once | INTRAVENOUS | Status: AC | PRN
  Administered 2022-02-19: 100 mL via INTRAVENOUS

## 2022-03-18 ENCOUNTER — Other Ambulatory Visit (HOSPITAL_COMMUNITY): Payer: Self-pay

## 2022-03-18 ENCOUNTER — Telehealth: Payer: Self-pay

## 2022-03-18 ENCOUNTER — Encounter: Payer: Self-pay | Admitting: Pharmacist

## 2022-03-18 NOTE — Telephone Encounter (Signed)
[facility] Patient Advocate Encounter   Received notification from [place] that prior authorization for [employer] is required.(Medical Benefits) J-Code [phone]   PA submitted on 03/18/22 Key -[id number] Faxed chart notes and labs to [phone] Phone # [phone] Status is pending    [facility] will continue to follow.   Ileene Patrick, [facility] Patient [facility] for Infectious Disease Phone: [phone] Fax:  [phone]

## 2022-03-29 ENCOUNTER — Telehealth: Payer: Self-pay

## 2022-03-29 ENCOUNTER — Encounter: Payer: Self-pay | Admitting: Pharmacist

## 2022-03-29 NOTE — Telephone Encounter (Signed)
[facility] Patient Advocate Encounter  Prior Authorization for [employer] has been approved.  (Medical Benefits)   PA# [id number] Effective dates: 03/23/22 through 03/24/23    [facility] will continue to follow.  Ileene Patrick, [facility] Patient [facility] for Infectious Disease Phone: [phone] Fax:  [phone]

## 2022-03-30 ENCOUNTER — Other Ambulatory Visit: Payer: Self-pay

## 2022-03-30 ENCOUNTER — Ambulatory Visit (INDEPENDENT_AMBULATORY_CARE_PROVIDER_SITE_OTHER): Payer: 59 | Admitting: Pharmacist

## 2022-03-30 DIAGNOSIS — B20 Human immunodeficiency virus [HIV] disease: Secondary | ICD-10-CM

## 2022-03-30 MED ORDER — CABOTEGRAVIR & RILPIVIRINE ER 600 & 900 MG/3ML IM SUER
1.0000 | Freq: Once | INTRAMUSCULAR | Status: AC
  Administered 2022-03-30: 1 via INTRAMUSCULAR

## 2022-03-30 NOTE — Progress Notes (Signed)
HPI: Julie Fox is a 46 y.o. female who presents to the [place] clinic for [place] administration.  Patient Active Problem List   Diagnosis Date Noted   Pelvic mass in female    Transaminitis 08/18/2020   Vitamin D deficiency 08/18/2020   Class 3 severe obesity with serious comorbidity and body mass index (BMI) of 40.0 to 44.9 in adult [facility]) 08/18/2020   Mild single current episode of major depressive disorder ([place]) 06/20/2018   Post-operative state 01/03/2018   Abnormal uterine bleeding (AUB) 12/12/2017   GAD (generalized anxiety disorder) 06/13/2017   Hyperlipidemia 06/13/2017   HIV disease ([place]) 12/23/2016    Patient's Medications  New Prescriptions   No medications on file  Previous Medications   ATORVASTATIN (LIPITOR) 20 MG TABLET    Take 1 tablet (20 mg total) by mouth daily.   CABENUVA 600 & 900 MG/3ML INJECTION       OMEPRAZOLE (PRILOSEC) 40 MG CAPSULE    Take 40 mg by mouth daily.   SERTRALINE (ZOLOFT) 50 MG TABLET    Take 1 tablet (50 mg total) by mouth daily.  Modified Medications   No medications on file  Discontinued Medications   No medications on file    Allergies: Allergies  Allergen Reactions   Penicillins Anaphylaxis    Has patient had a PCN reaction causing immediate rash, facial/tongue/throat swelling, SOB or lightheadedness with hypotension: yes Has patient had a PCN reaction causing severe rash involving mucus membranes or skin necrosis: no Has patient had a PCN reaction that required hospitalization: no Has patient had a PCN reaction occurring within the last 10 years: yes If all of the above answers are "NO", then may proceed with Cephalosporin use.    Sulfa Antibiotics Rash    Past Medical History: Past Medical History:  Diagnosis Date   Allergy    Anxiety    Asthma    Chronic kidney disease    Depression    GAD (generalized anxiety disorder)    Gallbladder problem    GERD (gastroesophageal reflux disease)    no meds,  diet controlled   Heartburn    High cholesterol    History of colon polyps    History of epilepsy    childhood seizures - last one age 27 yr, non since   HIV (human immunodeficiency virus infection) ([place])    Hx of abnormal cervical Pap smear    2012 per pt, then normal since    Migraine    otc med prn   Obesity    Pneumonia    Pure hypercholesterolemia    Seasonal allergies    Seizures ([place])    Juvenile epilepsy   Sleep apnea    Vitamin B12 deficiency    Vitamin D deficiency     Social History: Social History   Socioeconomic History   Marital status: Single    Spouse name: Not on file   Number of children: Not on file   Years of education: Not on file   Highest education level: Not on file  Occupational History   Not on file  Tobacco Use   Smoking status: Former    Packs/day: 0.25    Years: 20.00    Pack years: 5.00    Types: Cigarettes    Start date: 12/24/1995    Quit date: 10/08/2020    Years since quitting: 1.4   Smokeless tobacco: Never   Tobacco comments:    quit 09/2020  Vaping Use   Vaping  Use: Never used  Substance and Sexual Activity   Alcohol use: No   Drug use: No   Sexual activity: Not Currently    Partners: Male    Birth control/protection: None    Comment: offered condoms  Other Topics Concern   Not on file  Social History Narrative   Work or School: Engineer, structural, 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: lives with mother      Spiritual Beliefs:      Lifestyle: no regular exercise, diet not great   Social Determinants of Health   Financial Resource Strain: Not on file  Food Insecurity: Not on file  Transportation Needs: Not on file  Physical Activity: Not on file  Stress: Not on file  Social Connections: Not on file    Labs: Lab Results  Component Value Date   HIV1RNAQUANT Not Detected 12/01/2021   HIV1RNAQUANT NOT DETECTED 08/03/2021   HIV1RNAQUANT Not Detected 02/04/2021   CD4TABS 1,375 08/03/2021   CD4TABS 1,731 02/04/2021    CD4TABS 1,408 07/23/2020    RPR and STI Lab Results  Component Value Date   LABRPR NON-REACTIVE 12/01/2021   LABRPR NON-REACTIVE 08/03/2021   LABRPR NON-REACTIVE 02/04/2021   LABRPR NON-REACTIVE 07/23/2020   LABRPR NON-REACTIVE 06/04/2019    STI Results GC CT  12/27/2018 12:00 AM Negative   Negative    09/27/2017 12:00 AM Negative   Negative    09/27/2016 12:00 AM Negative   Negative      Hepatitis B Lab Results  Component Value Date   HEPBSAB REACTIVE (A) 09/02/2021   HEPBSAG NON-REACTIVE 07/23/2020   HEPBCAB NON REACTIVE 09/27/2016   Hepatitis C Lab Results  Component Value Date   HEPCAB NON-REACTIVE 09/02/2021   Hepatitis A Lab Results  Component Value Date   HAV NON REACTIVE 09/27/2016   Lipids: Lab Results  Component Value Date   CHOL 221 (H) 12/01/2021   TRIG 154 (H) 12/01/2021   HDL 44 (L) 12/01/2021   CHOLHDL 5.0 (H) 12/01/2021   VLDL 26.2 06/20/2018   LDLCALC 148 (H) 12/01/2021    TARGET DATE:  The 4th of the month  Current HIV Regimen: Cabenuva  Assessment: Camil presents today for their maintenance Cabenuva injections. Initial/past injections were tolerated well without issues. No problems with systemic effects of injections. Patient did experience no issues with injection. No new partners.  Administered cabotegravir '600mg'$ /95m in left upper outer quadrant of the gluteal muscle. Administered rilpivirine 900 mg/324min the right upper outer quadrant of the gluteal muscle. Monitored patient for 10 minutes after injection. Injections were tolerated well without issue. Patient will follow up in 2 months for next injection.  I also asked about her past intra-abdominal problems and she stated that this was still ongoing. She is now following with a gastroenterologist.  She had to leave her appointment prior to lab draw in order to to make it to work this morning. She is now scheduled to come in this Friday for lab draw.   Plan: - Cabenuva injections  administered - Next injection scheduled for 05/27/22 '@10'$  am with Cassie - Check HIV RNA and urine cytology on 04/02/22 @ 11am - Call with any issues or questions  AlLestine BoxPharmD PGY2 Infectious Diseases Pharmacy Resident

## 2022-04-02 ENCOUNTER — Other Ambulatory Visit (HOSPITAL_COMMUNITY)
Admission: RE | Admit: 2022-04-02 | Discharge: 2022-04-02 | Disposition: A | Discharge status: Home / Self Care | Payer: 59 | Source: Ambulatory Visit | Attending: Internal Medicine | Admitting: Internal Medicine

## 2022-04-02 ENCOUNTER — Other Ambulatory Visit: Payer: 59

## 2022-04-02 ENCOUNTER — Other Ambulatory Visit: Payer: Self-pay

## 2022-04-02 ENCOUNTER — Other Ambulatory Visit: Payer: Self-pay | Admitting: Emergency Medicine

## 2022-04-02 DIAGNOSIS — B20 Human immunodeficiency virus [HIV] disease: Secondary | ICD-10-CM | POA: Diagnosis present

## 2022-04-05 DIAGNOSIS — E7800 Pure hypercholesterolemia, unspecified: Secondary | ICD-10-CM | POA: Insufficient documentation

## 2022-04-05 DIAGNOSIS — F419 Anxiety disorder, unspecified: Secondary | ICD-10-CM | POA: Insufficient documentation

## 2022-04-05 DIAGNOSIS — B20 Human immunodeficiency virus [HIV] disease: Secondary | ICD-10-CM | POA: Insufficient documentation

## 2022-04-05 LAB — URINE CYTOLOGY ANCILLARY ONLY
Chlamydia: NEGATIVE
Comment: NEGATIVE
Comment: NORMAL
Neisseria Gonorrhea: NEGATIVE

## 2022-04-06 LAB — HIV-1 RNA QUANT-NO REFLEX-BLD
HIV 1 RNA Quant: NOT DETECTED Copies/mL
HIV-1 RNA Quant, Log: NOT DETECTED Log cps/mL

## 2022-05-15 ENCOUNTER — Encounter: Payer: Self-pay | Admitting: Internal Medicine

## 2022-05-17 ENCOUNTER — Other Ambulatory Visit: Payer: Self-pay | Admitting: Pharmacist

## 2022-05-17 DIAGNOSIS — U071 COVID-19: Secondary | ICD-10-CM

## 2022-05-17 MED ORDER — NIRMATRELVIR/RITONAVIR (PAXLOVID)TABLET: 3.0000 | ORAL_TABLET | Freq: Two times a day (BID) | ORAL | 0 refills | Status: AC

## 2022-05-17 NOTE — Telephone Encounter (Signed)
There is no drug interaction between Paxlovid and [place]! I am more than happy to send her in a prescription to [facility] if she would like me to.

## 2022-05-27 ENCOUNTER — Other Ambulatory Visit: Payer: Self-pay

## 2022-05-27 ENCOUNTER — Ambulatory Visit (INDEPENDENT_AMBULATORY_CARE_PROVIDER_SITE_OTHER): Payer: 59 | Admitting: Pharmacist

## 2022-05-27 DIAGNOSIS — B20 Human immunodeficiency virus [HIV] disease: Secondary | ICD-10-CM

## 2022-05-27 MED ORDER — CABOTEGRAVIR & RILPIVIRINE ER 600 & 900 MG/3ML IM SUER
1.0000 | Freq: Once | INTRAMUSCULAR | Status: AC
  Administered 2022-05-27: 1 via INTRAMUSCULAR

## 2022-05-27 NOTE — Progress Notes (Signed)
HPI: Julie Fox is a 46 y.o. female who presents to the Julie Fox clinic for Julie Fox administration.  Patient Active Problem List   Diagnosis Date Noted   Pelvic mass in female    Transaminitis 08/18/2020   Vitamin D deficiency 08/18/2020   Class 3 severe obesity with serious comorbidity and body mass index (BMI) of 40.0 to 44.9 in adult [facility]) 08/18/2020   Mild single current episode of major depressive disorder (Julie Fox) 06/20/2018   Post-operative state 01/03/2018   Abnormal uterine bleeding (AUB) 12/12/2017   GAD (generalized anxiety disorder) 06/13/2017   Hyperlipidemia 06/13/2017   Julie disease (Julie Fox) 12/23/2016    Patient's Medications  New Prescriptions   No medications on Fox  Previous Medications   ATORVASTATIN (LIPITOR) 20 MG TABLET    Take 1 tablet (20 mg total) by mouth daily.   CABENUVA 600 & 900 MG/3ML INJECTION       OMEPRAZOLE (PRILOSEC) 40 MG CAPSULE    Take 40 mg by mouth daily.   SERTRALINE (ZOLOFT) 50 MG TABLET    Take 1 tablet (50 mg total) by mouth daily.  Modified Medications   No medications on Fox  Discontinued Medications   No medications on Fox    Allergies: Allergies  Allergen Reactions   Penicillins Anaphylaxis    Has patient had a PCN reaction causing immediate rash, facial/tongue/throat swelling, SOB or lightheadedness with hypotension: yes Has patient had a PCN reaction causing severe rash involving mucus membranes or skin necrosis: no Has patient had a PCN reaction that required hospitalization: no Has patient had a PCN reaction occurring within the last 10 years: yes If all of the above answers are "NO", then may proceed with Cephalosporin use.    Sulfa Antibiotics Rash    Past Medical History: Past Medical History:  Diagnosis Date   Allergy    Anxiety    Asthma    Chronic kidney disease    Depression    GAD (generalized anxiety disorder)    Gallbladder problem    GERD (gastroesophageal reflux disease)    no meds,  diet controlled   Heartburn    High cholesterol    History of colon polyps    History of epilepsy    childhood seizures - last one age 88 yr, non since   Julie (human immunodeficiency virus infection) (Julie Fox)    Hx of abnormal cervical Pap smear    2012 per pt, then normal since    Migraine    otc med prn   Obesity    Pneumonia    Pure hypercholesterolemia    Seasonal allergies    Seizures (Julie Fox)    Juvenile epilepsy   Sleep apnea    Vitamin B12 deficiency    Vitamin D deficiency     Social History: Social History   Socioeconomic History   Marital status: Single    Spouse name: Julie Fox   Number of children: Julie Fox   Years of education: Julie Fox   Highest education level: Julie Fox  Occupational History   Julie Fox  Tobacco Use   Smoking status: Former    Packs/day: 0.25    Years: 20.00    Total pack years: 5.00    Types: Cigarettes    Start date: 12/24/1995    Quit date: 10/08/2020    Years since quitting: 1.6   Smokeless tobacco: Never   Tobacco comments:    quit 09/2020  Vaping Use  Vaping Use: Never used  Substance and Sexual Activity   Alcohol use: No   Drug use: No   Sexual activity: Julie Currently    Partners: Male    Birth control/protection: None    Comment: offered condoms  Other Topics Concern   Julie Fox  Social History Narrative   Fox or School: Julie Fox      Home Situation: lives with mother      Spiritual Beliefs:      Lifestyle: no regular exercise, diet Julie great   Social Determinants of Health   Financial Resource Strain: Julie Fox  Food Insecurity: Julie Fox  Transportation Needs: Julie Fox  Physical Activity: Julie Fox  Stress: Julie Fox  Social Connections: Julie Fox    Labs: Lab Results  Component Value Date   HIV1RNAQUANT Julie Detected 04/02/2022   HIV1RNAQUANT Julie Detected 12/01/2021   HIV1RNAQUANT Julie DETECTED 08/03/2021   CD4TABS 1,375 08/03/2021   CD4TABS 1,731  02/04/2021   CD4TABS 1,408 07/23/2020    RPR and STI Lab Results  Component Value Date   LABRPR NON-REACTIVE 12/01/2021   LABRPR NON-REACTIVE 08/03/2021   LABRPR NON-REACTIVE 02/04/2021   LABRPR NON-REACTIVE 07/23/2020   LABRPR NON-REACTIVE 06/04/2019    STI Results GC CT  04/02/2022 11:11 AM Negative  Negative   12/27/2018 12:00 AM Negative  Negative   09/27/2017 12:00 AM Negative  Negative   09/27/2016 12:00 AM Negative  Negative     Hepatitis B Lab Results  Component Value Date   HEPBSAB REACTIVE (A) 09/02/2021   HEPBSAG NON-REACTIVE 07/23/2020   HEPBCAB NON REACTIVE 09/27/2016   Hepatitis C Lab Results  Component Value Date   HEPCAB NON-REACTIVE 09/02/2021   Hepatitis A Lab Results  Component Value Date   HAV NON REACTIVE 09/27/2016   Lipids: Lab Results  Component Value Date   CHOL 221 (H) 12/01/2021   TRIG 154 (H) 12/01/2021   HDL 44 (L) 12/01/2021   CHOLHDL 5.0 (H) 12/01/2021   VLDL 26.2 06/20/2018   LDLCALC 148 (H) 12/01/2021    TARGET DATE: The 4th  Assessment: Julie Fox presents today for her maintenance Cabenuva injections. Past injections were tolerated well without issues.  Administered cabotegravir '600mg'$ /40m in left upper outer quadrant of the gluteal muscle. Administered rilpivirine 900 mg/336min the right upper outer quadrant of the gluteal muscle. No issues with injections. She will follow up in 2 months for next set of injections.  Of note, she is getting bariatric surgery on October 11th. I advised that there should Julie be any changes or issues with Cabenuva and bariatric surgery but that I would check.   Plan: - Cabenuva injections administered - Next injections scheduled for 07/27/22 with Dr. SnBaxter Flatterynd 09/27/22 with me - Call with any issues or questions  Manuelita Moxon L. Lycan Davee, PharmD, BCIDP, AAHIVP, CPAtomic Citylinical Pharmacist Practitioner [place]

## 2022-06-02 ENCOUNTER — Encounter (INDEPENDENT_AMBULATORY_CARE_PROVIDER_SITE_OTHER): Payer: Self-pay

## 2022-07-27 ENCOUNTER — Ambulatory Visit: Payer: 59 | Admitting: Internal Medicine

## 2022-07-27 ENCOUNTER — Encounter: Payer: Self-pay | Admitting: Internal Medicine

## 2022-07-27 ENCOUNTER — Other Ambulatory Visit: Payer: Self-pay

## 2022-07-27 VITALS — BP 132/83 | HR 79 | Resp 16 | Ht 68.0 in | Wt 298.0 lb

## 2022-07-27 DIAGNOSIS — M542 Cervicalgia: Secondary | ICD-10-CM | POA: Insufficient documentation

## 2022-07-27 DIAGNOSIS — R5381 Other malaise: Secondary | ICD-10-CM | POA: Insufficient documentation

## 2022-07-27 DIAGNOSIS — K149 Disease of tongue, unspecified: Secondary | ICD-10-CM | POA: Insufficient documentation

## 2022-07-27 DIAGNOSIS — R200 Anesthesia of skin: Secondary | ICD-10-CM | POA: Insufficient documentation

## 2022-07-27 DIAGNOSIS — G43909 Migraine, unspecified, not intractable, without status migrainosus: Secondary | ICD-10-CM | POA: Insufficient documentation

## 2022-07-27 DIAGNOSIS — F32A Depression, unspecified: Secondary | ICD-10-CM | POA: Insufficient documentation

## 2022-07-27 DIAGNOSIS — R4702 Dysphasia: Secondary | ICD-10-CM | POA: Insufficient documentation

## 2022-07-27 DIAGNOSIS — K090 Developmental odontogenic cysts: Secondary | ICD-10-CM | POA: Insufficient documentation

## 2022-07-27 DIAGNOSIS — A6000 Herpesviral infection of urogenital system, unspecified: Secondary | ICD-10-CM | POA: Insufficient documentation

## 2022-07-27 DIAGNOSIS — E669 Obesity, unspecified: Secondary | ICD-10-CM | POA: Insufficient documentation

## 2022-07-27 DIAGNOSIS — Z23 Encounter for immunization: Secondary | ICD-10-CM

## 2022-07-27 DIAGNOSIS — M533 Sacrococcygeal disorders, not elsewhere classified: Secondary | ICD-10-CM | POA: Insufficient documentation

## 2022-07-27 DIAGNOSIS — H6690 Otitis media, unspecified, unspecified ear: Secondary | ICD-10-CM | POA: Insufficient documentation

## 2022-07-27 DIAGNOSIS — R233 Spontaneous ecchymoses: Secondary | ICD-10-CM | POA: Insufficient documentation

## 2022-07-27 DIAGNOSIS — K122 Cellulitis and abscess of mouth: Secondary | ICD-10-CM | POA: Insufficient documentation

## 2022-07-27 DIAGNOSIS — D689 Coagulation defect, unspecified: Secondary | ICD-10-CM | POA: Insufficient documentation

## 2022-07-27 DIAGNOSIS — D649 Anemia, unspecified: Secondary | ICD-10-CM | POA: Insufficient documentation

## 2022-07-27 DIAGNOSIS — E049 Nontoxic goiter, unspecified: Secondary | ICD-10-CM | POA: Insufficient documentation

## 2022-07-27 DIAGNOSIS — Z21 Asymptomatic human immunodeficiency virus [HIV] infection status: Secondary | ICD-10-CM | POA: Insufficient documentation

## 2022-07-27 DIAGNOSIS — J309 Allergic rhinitis, unspecified: Secondary | ICD-10-CM | POA: Insufficient documentation

## 2022-07-27 DIAGNOSIS — R591 Generalized enlarged lymph nodes: Secondary | ICD-10-CM | POA: Insufficient documentation

## 2022-07-27 DIAGNOSIS — G4700 Insomnia, unspecified: Secondary | ICD-10-CM | POA: Insufficient documentation

## 2022-07-27 DIAGNOSIS — H6990 Unspecified Eustachian tube disorder, unspecified ear: Secondary | ICD-10-CM | POA: Insufficient documentation

## 2022-07-27 DIAGNOSIS — R059 Cough, unspecified: Secondary | ICD-10-CM | POA: Insufficient documentation

## 2022-07-27 DIAGNOSIS — J029 Acute pharyngitis, unspecified: Secondary | ICD-10-CM | POA: Insufficient documentation

## 2022-07-27 DIAGNOSIS — J329 Chronic sinusitis, unspecified: Secondary | ICD-10-CM | POA: Insufficient documentation

## 2022-07-27 DIAGNOSIS — B20 Human immunodeficiency virus [HIV] disease: Secondary | ICD-10-CM

## 2022-07-27 DIAGNOSIS — L0390 Cellulitis, unspecified: Secondary | ICD-10-CM | POA: Insufficient documentation

## 2022-07-27 MED ORDER — CABOTEGRAVIR & RILPIVIRINE ER 600 & 900 MG/3ML IM SUER
1.0000 | Freq: Once | INTRAMUSCULAR | Status: AC
  Administered 2022-07-27: 1 via INTRAMUSCULAR

## 2022-07-27 NOTE — Progress Notes (Signed)
Patient ID: Julie Fox, female   DOB: 12-28-1975, 46 y.o.   MRN: [id number]  HPI Julie Fox is a 46 yo F with well controlled hiv disease, now on cabenuva. Tolerating injections without issues. Also has hx of anxiety which is at baseline  Outpatient Encounter Medications as of 07/27/2022  Medication Sig   CABENUVA 600 & 900 MG/3ML injection    omeprazole (PRILOSEC) 40 MG capsule Take 40 mg by mouth daily.   sertraline (ZOLOFT) 50 MG tablet Take 1 tablet (50 mg total) by mouth daily.   [DISCONTINUED] atorvastatin (LIPITOR) 20 MG tablet Take 1 tablet (20 mg total) by mouth daily. (Patient not taking: Reported on 12/02/2022)   [EXPIRED] cabotegravir & rilpivirine ER (CABENUVA) 600 & 900 MG/3ML injection 1 kit    No facility-administered encounter medications on file as of 07/27/2022.     Patient Active Problem List   Diagnosis Date Noted   Obesity with body mass index 30 or greater 07/27/2022   Easy bruising 07/27/2022   Eruption cyst 07/27/2022   Allergic rhinitis 07/27/2022   Acute otitis media 07/27/2022   Anemia 07/27/2022   Blood coagulation disorder (HCC) 07/27/2022   Cellulitis 07/27/2022   Cough 07/27/2022   Depressive disorder 07/27/2022   Disorder of coccyx 07/27/2022   Dysphasia 07/27/2022   Eustachian tube dysfunction 07/27/2022   Genital herpes simplex 07/27/2022   Goiter 07/27/2022   Insomnia 07/27/2022   Migraine 07/27/2022   Malaise and fatigue 07/27/2022   Lymphadenopathy 07/27/2022   Neck pain 07/27/2022   Numbness 07/27/2022   Abscess of oral tissue 07/27/2022   Cellulitis of oral soft tissues 07/27/2022   Disorder of tongue 07/27/2022   Pharyngitis 07/27/2022   Sinusitis 07/27/2022   Human immunodeficiency virus infection ([name]) 07/27/2022   Anxiety 04/05/2022   Morbid obesity with BMI of 45.0-49.9, adult (Julie Fox) 04/05/2022   HIV infection (Julie Fox) 04/05/2022   High cholesterol 04/05/2022   Pelvic mass in female    Transaminitis 08/18/2020    Vitamin D deficiency 08/18/2020   Class 3 severe obesity with serious comorbidity and body mass index (BMI) of 40.0 to 44.9 in adult (Julie Fox) 08/18/2020   Acid reflux 08/09/2018   History of adenomatous polyp of colon 07/12/2018   Internal hemorrhoids 07/12/2018   Mild single current episode of major depressive disorder (Julie Fox) 06/20/2018   Post-operative state 01/03/2018   Abnormal uterine bleeding (AUB) 12/12/2017   GAD (generalized anxiety disorder) 06/13/2017   Hyperlipidemia 06/13/2017   HIV disease (Julie Fox) 12/23/2016     Health Maintenance Due  Topic Date Due   PAP SMEAR-Modifier  06/13/2020   COVID-19 Vaccine (4 - 2023-24 season) 06/25/2022     Review of Systems 12 point ros is negative Physical Exam   BP 132/83   Pulse 79   Resp 16   Ht '5\' 8"'$  (1.727 m)   Wt 298 lb (135.2 kg)   LMP 12/26/2017   SpO2 97%   BMI 45.31 kg/m   Physical Exam  Constitutional:  oriented to person, place, and time. appears well-developed and well-nourished. No distress.  HENT: [place]/AT, PERRLA, no scleral icterus Mouth/Throat: Oropharynx is clear and moist. No oropharyngeal exudate.  Cardiovascular: Normal rate, regular rhythm and normal heart sounds. Exam reveals no gallop and no friction rub.  No murmur heard.  Pulmonary/Chest: Effort normal and breath sounds normal. No respiratory distress.  has no wheezes.  Neck = supple, no nuchal rigidity Abdominal: Soft. Bowel sounds are normal.  exhibits no distension. There is  no tenderness.  Lymphadenopathy: no cervical adenopathy. No axillary adenopathy Neurological: alert and oriented to person, place, and time.  Skin: Skin is warm and dry. No rash noted. No erythema.  Psychiatric: a normal mood and affect.  behavior is normal.     Labs: reviewed  Assessment and Plan  Hiv disease= plan to get labs and next dose of cabenuva injection  Long term medication management = will check cr  Health maintenance= recommend flu and covid  vaccine

## 2022-07-29 LAB — HIV-1 RNA QUANT-NO REFLEX-BLD
HIV 1 RNA Quant: NOT DETECTED Copies/mL
HIV-1 RNA Quant, Log: NOT DETECTED Log cps/mL

## 2022-09-06 ENCOUNTER — Other Ambulatory Visit: Payer: Self-pay | Admitting: Obstetrics & Gynecology

## 2022-09-06 DIAGNOSIS — Z1231 Encounter for screening mammogram for malignant neoplasm of breast: Secondary | ICD-10-CM

## 2022-09-07 ENCOUNTER — Ambulatory Visit: Payer: 59

## 2022-09-23 ENCOUNTER — Ambulatory Visit
Admission: RE | Admit: 2022-09-23 | Discharge: 2022-09-23 | Disposition: A | Discharge status: Home / Self Care | Payer: 59 | Source: Ambulatory Visit | Attending: Obstetrics & Gynecology | Admitting: Obstetrics & Gynecology

## 2022-09-23 DIAGNOSIS — Z1231 Encounter for screening mammogram for malignant neoplasm of breast: Secondary | ICD-10-CM

## 2022-09-28 ENCOUNTER — Ambulatory Visit (INDEPENDENT_AMBULATORY_CARE_PROVIDER_SITE_OTHER): Payer: 59 | Admitting: Pharmacist

## 2022-09-28 ENCOUNTER — Other Ambulatory Visit: Payer: Self-pay

## 2022-09-28 DIAGNOSIS — B20 Human immunodeficiency virus [HIV] disease: Secondary | ICD-10-CM | POA: Diagnosis not present

## 2022-09-28 MED ORDER — CABOTEGRAVIR & RILPIVIRINE ER 600 & 900 MG/3ML IM SUER
1.0000 | Freq: Once | INTRAMUSCULAR | Status: AC
  Administered 2022-09-28: 1 via INTRAMUSCULAR

## 2022-09-28 NOTE — Progress Notes (Signed)
HPI: Julie Fox is a 46 y.o. female who presents to the [facility] for [place] administration.  Patient Active Problem List   Diagnosis Date Noted   Obesity with body mass index 30 or greater 07/27/2022   Easy bruising 07/27/2022   Eruption cyst 07/27/2022   Allergic rhinitis 07/27/2022   Acute otitis media 07/27/2022   Anemia 07/27/2022   Blood coagulation disorder (HCC) 07/27/2022   Cellulitis 07/27/2022   Cough 07/27/2022   Depressive disorder 07/27/2022   Disorder of coccyx 07/27/2022   Dysphasia 07/27/2022   Eustachian tube dysfunction 07/27/2022   Genital herpes simplex 07/27/2022   Goiter 07/27/2022   Insomnia 07/27/2022   Migraine 07/27/2022   Malaise and fatigue 07/27/2022   Lymphadenopathy 07/27/2022   Neck pain 07/27/2022   Numbness 07/27/2022   Abscess of oral tissue 07/27/2022   Cellulitis of oral soft tissues 07/27/2022   Disorder of tongue 07/27/2022   Pharyngitis 07/27/2022   Sinusitis 07/27/2022   Human immunodeficiency virus infection ([place]) 07/27/2022   Anxiety 04/05/2022   Morbid obesity with BMI of 45.0-49.9, adult ([place]) 04/05/2022   HIV infection ([place]) 04/05/2022   High cholesterol 04/05/2022   Pelvic mass in female    Transaminitis 08/18/2020   Vitamin D deficiency 08/18/2020   Class 3 severe obesity with serious comorbidity and body mass index (BMI) of 40.0 to 44.9 in adult ([place]) 08/18/2020   Acid reflux 08/09/2018   History of adenomatous polyp of colon 07/12/2018   Internal hemorrhoids 07/12/2018   Mild single current episode of major depressive disorder ([place]) 06/20/2018   Post-operative state 01/03/2018   Abnormal uterine bleeding (AUB) 12/12/2017   GAD (generalized anxiety disorder) 06/13/2017   Hyperlipidemia 06/13/2017   HIV disease ([place]) 12/23/2016    Patient's Medications  New Prescriptions   No medications on file  Previous Medications   ATORVASTATIN (LIPITOR) 20 MG TABLET    Take 1 tablet (20 mg total) by  mouth daily.   CABENUVA 600 & 900 MG/3ML INJECTION       OMEPRAZOLE (PRILOSEC) 40 MG CAPSULE    Take 40 mg by mouth daily.   SERTRALINE (ZOLOFT) 50 MG TABLET    Take 1 tablet (50 mg total) by mouth daily.  Modified Medications   No medications on file  Discontinued Medications   No medications on file    Allergies: Allergies  Allergen Reactions   Penicillins Anaphylaxis    Has patient had a PCN reaction causing immediate rash, facial/tongue/throat swelling, SOB or lightheadedness with hypotension: yes Has patient had a PCN reaction causing severe rash involving mucus membranes or skin necrosis: no Has patient had a PCN reaction that required hospitalization: no Has patient had a PCN reaction occurring within the last 10 years: yes If all of the above answers are "NO", then may proceed with Cephalosporin use.    Sulfa Antibiotics Rash    Past Medical History: Past Medical History:  Diagnosis Date   Allergy    Anxiety    Asthma    Chronic kidney disease    Depression    GAD (generalized anxiety disorder)    Gallbladder problem    GERD (gastroesophageal reflux disease)    no meds, diet controlled   Heartburn    High cholesterol    History of colon polyps    History of epilepsy    childhood seizures - last one age 68 yr, non since   HIV (human immunodeficiency virus infection) ([place])    Hx of  abnormal cervical Pap smear    2012 per pt, then normal since    Migraine    otc med prn   Obesity    Pneumonia    Pure hypercholesterolemia    Seasonal allergies    Seizures ([place])    Juvenile epilepsy   Sleep apnea    Vitamin B12 deficiency    Vitamin D deficiency     Social History: Social History   Socioeconomic History   Marital status: Single    Spouse name: Not on file   Number of children: Not on file   Years of education: Not on file   Highest education level: Not on file  Occupational History   Not on file  Tobacco Use   Smoking status: Former     Packs/day: 0.25    Years: 20.00    Total pack years: 5.00    Types: Cigarettes    Start date: 12/24/1995    Quit date: 10/08/2020    Years since quitting: 1.9   Smokeless tobacco: Never   Tobacco comments:    quit 09/2020  Vaping Use   Vaping Use: Never used  Substance and Sexual Activity   Alcohol use: No   Drug use: No   Sexual activity: Not Currently    Partners: Male    Birth control/protection: None    Comment: offered condoms  Other Topics Concern   Not on file  Social History Narrative   Work or School: Engineer, structural, 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: lives with mother      Spiritual Beliefs:      Lifestyle: no regular exercise, diet not great   Social Determinants of Health   Financial Resource Strain: Not on file  Food Insecurity: Not on file  Transportation Needs: Not on file  Physical Activity: Not on file  Stress: Not on file  Social Connections: Not on file    Labs: Lab Results  Component Value Date   HIV1RNAQUANT Not Detected 07/27/2022   HIV1RNAQUANT Not Detected 04/02/2022   HIV1RNAQUANT Not Detected 12/01/2021   CD4TABS 1,375 08/03/2021   CD4TABS 1,731 02/04/2021   CD4TABS 1,408 07/23/2020    RPR and STI Lab Results  Component Value Date   LABRPR NON-REACTIVE 12/01/2021   LABRPR NON-REACTIVE 08/03/2021   LABRPR NON-REACTIVE 02/04/2021   LABRPR NON-REACTIVE 07/23/2020   LABRPR NON-REACTIVE 06/04/2019    STI Results GC CT  04/02/2022 11:11 AM Negative  Negative   12/27/2018 12:00 AM Negative  Negative   09/27/2017 12:00 AM Negative  Negative   09/27/2016 12:00 AM Negative  Negative     Hepatitis B Lab Results  Component Value Date   HEPBSAB REACTIVE (A) 09/02/2021   HEPBSAG NON-REACTIVE 07/23/2020   HEPBCAB NON REACTIVE 09/27/2016   Hepatitis C Lab Results  Component Value Date   HEPCAB NON-REACTIVE 09/02/2021   Hepatitis A Lab Results  Component Value Date   HAV NON REACTIVE 09/27/2016   Lipids: Lab Results  Component  Value Date   CHOL 221 (H) 12/01/2021   TRIG 154 (H) 12/01/2021   HDL 44 (L) 12/01/2021   CHOLHDL 5.0 (H) 12/01/2021   VLDL 26.2 06/20/2018   LDLCALC 148 (H) 12/01/2021    TARGET DATE: The 4th  Assessment: Farrell presents today for her maintenance Cabenuva injections. Past injections were tolerated well without issues.  Administered cabotegravir '600mg'$ /46m in left upper outer quadrant of the gluteal muscle. Administered rilpivirine 900 mg/313min the right upper outer quadrant of the  gluteal muscle. No issues with injections. She will follow up in 2 months for next set of injections.  . Of note, she is trying to transfer care to [facility]. Made appointment with [name] for 2 months just in case she has not transferred yet. Politely declined COVID vaccine today.  Plan: - Cabenuva injections administered - Next injections scheduled for 11/29/22 with [name]   - Call with any issues or questions  Harshita Bernales L. Jameire Kouba, PharmD, BCIDP, AAHIVP, [place] Clinical Pharmacist Practitioner [facility] for Infectious Disease

## 2022-11-21 ENCOUNTER — Encounter: Payer: Self-pay | Admitting: Pharmacist

## 2022-11-29 ENCOUNTER — Encounter: Payer: 59 | Admitting: Internal Medicine

## 2022-12-02 ENCOUNTER — Other Ambulatory Visit: Payer: Self-pay

## 2022-12-02 ENCOUNTER — Encounter: Payer: 59 | Admitting: Pharmacist

## 2022-12-02 ENCOUNTER — Ambulatory Visit (INDEPENDENT_AMBULATORY_CARE_PROVIDER_SITE_OTHER): Payer: 59 | Admitting: Internal Medicine

## 2022-12-02 ENCOUNTER — Encounter: Payer: Self-pay | Admitting: Internal Medicine

## 2022-12-02 VITALS — BP 123/78 | HR 73 | Temp 97.1°F | Wt 239.0 lb

## 2022-12-02 DIAGNOSIS — Z23 Encounter for immunization: Secondary | ICD-10-CM | POA: Diagnosis not present

## 2022-12-02 DIAGNOSIS — B20 Human immunodeficiency virus [HIV] disease: Secondary | ICD-10-CM | POA: Diagnosis not present

## 2022-12-02 MED ORDER — CABOTEGRAVIR & RILPIVIRINE ER 600 & 900 MG/3ML IM SUER
1.0000 | Freq: Once | INTRAMUSCULAR | Status: AC
  Administered 2022-12-02: 1 via INTRAMUSCULAR

## 2022-12-02 MED ORDER — ZOSTER VAC RECOMB ADJUVANTED 50 MCG/0.5ML IM SUSR: 0.5000 mL | INTRAMUSCULAR | 1 refills | Status: AC

## 2022-12-02 NOTE — Patient Instructions (Signed)
Vaccine today: Tetanus booster Pneumoccocal (prevnar 20)  Shingrix vaccine (2 shot series 1 month apart -- pharmacy can give you; rx given)    Pharmacy visit for ongoing cabenuva shots   Dr Baxter Flattery visit in 6 months   Labs today

## 2022-12-02 NOTE — Addendum Note (Signed)
Addended by: Daisy Floro T on: 12/02/2022 04:10 PM   Modules accepted: Orders

## 2022-12-02 NOTE — Addendum Note (Signed)
Addended by: Daisy Floro T on: 12/02/2022 04:12 PM   Modules accepted: Orders

## 2022-12-02 NOTE — Progress Notes (Signed)
RFV: follow up for hiv disease-cabenuva injection  Patient ID: Julie Fox, female   DOB: Feb 14, 1976, 47 y.o.   MRN: [id number]  HPI Julie Fox is a 47yo F with well controlled hiv disease, hld, and anxiety  She is in the process of transitioning care to [facility] (all other docs in [place] and she lives there), however they didn't have cabenuva (the clinic there don't have cabenuva and she would have to do all her paperwork and get it through outside pharmacy and bring it there) on her last visit so she requested urgent visit for her cabenuva q37monthshot. She last received cabenuva on 09/28/2022  She now wants to continue care with Dr Julie Fox  Lab Results  Component Value Date   HIV1RNAQUANT Not Detected 07/27/2022   Lab Results  Component Value Date   CD4TCELL 51 12/01/2021   CD4TABS 1,375 08/03/2021   She has no other complaint today   Outpatient Encounter Medications as of 12/02/2022  Medication Sig   atorvastatin (LIPITOR) 20 MG tablet Take 1 tablet (20 mg total) by mouth daily.   CABENUVA 600 & 900 MG/3ML injection    omeprazole (PRILOSEC) 40 MG capsule Take 40 mg by mouth daily.   sertraline (ZOLOFT) 50 MG tablet Take 1 tablet (50 mg total) by mouth daily.   No facility-administered encounter medications on file as of 12/02/2022.     Patient Active Problem List   Diagnosis Date Noted   Obesity with body mass index 30 or greater 07/27/2022   Easy bruising 07/27/2022   Eruption cyst 07/27/2022   Allergic rhinitis 07/27/2022   Acute otitis media 07/27/2022   Anemia 07/27/2022   Blood coagulation disorder (HCC) 07/27/2022   Cellulitis 07/27/2022   Cough 07/27/2022   Depressive disorder 07/27/2022   Disorder of coccyx 07/27/2022   Dysphasia 07/27/2022   Eustachian tube dysfunction 07/27/2022   Genital herpes simplex 07/27/2022   Goiter 07/27/2022   Insomnia 07/27/2022   Migraine 07/27/2022   Malaise and fatigue 07/27/2022   Lymphadenopathy  07/27/2022   Neck pain 07/27/2022   Numbness 07/27/2022   Abscess of oral tissue 07/27/2022   Cellulitis of oral soft tissues 07/27/2022   Disorder of tongue 07/27/2022   Pharyngitis 07/27/2022   Sinusitis 07/27/2022   Human immunodeficiency virus infection ([place] 07/27/2022   Anxiety 04/05/2022   Morbid obesity with BMI of 45.0-49.9, adult ([place] 04/05/2022   HIV infection ([place] 04/05/2022   High cholesterol 04/05/2022   Pelvic mass in female    Transaminitis 08/18/2020   Vitamin D deficiency 08/18/2020   Class 3 severe obesity with serious comorbidity and body mass index (BMI) of 40.0 to 44.9 in adult ([place] 08/18/2020   Acid reflux 08/09/2018   History of adenomatous polyp of colon 07/12/2018   Internal hemorrhoids 07/12/2018   Mild single current episode of major depressive disorder ([place] 06/20/2018   Post-operative state 01/03/2018   Abnormal uterine bleeding (AUB) 12/12/2017   GAD (generalized anxiety disorder) 06/13/2017   Hyperlipidemia 06/13/2017   HIV disease ([place] 12/23/2016     Health Maintenance Due  Topic Date Due   PAP SMEAR-Modifier  06/13/2020   COVID-19 Vaccine (4 - 2023-24 season) 06/25/2022   DTaP/Tdap/Td (3 - Td or Tdap) 10/25/2022     Review of Systems No health concern today No fever, chill, rash, joint pain, cough, n/v/diarrhea, headache No new medication or new problems since last seen  Patient had gastric bypass 08/04/2022 and is doing well -- her surgeon  is monitoring labs for this  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; no substance use; not in relationship and not sexually active and doesn't want std screening today   Physical Exam  General/constitutional: no distress, pleasant HEENT: Normocephalic, PER, Conj Clear, EOMI, Oropharynx clear Neck supple CV: rrr no mrg Lungs: clear to auscultation, normal respiratory effort Abd: Soft, Nontender Ext: no edema Skin: No Rash Neuro: nonfocal MSK: no peripheral joint  swelling/tenderness/warmth; back spines nontender    Lab Results  Component Value Date   CD4TCELL 51 12/01/2021   Lab Results  Component Value Date   CD4TABS 1,375 08/03/2021   CD4TABS 1,731 02/04/2021   CD4TABS 1,408 07/23/2020   Lab Results  Component Value Date   HIV1RNAQUANT Not Detected 07/27/2022   Lab Results  Component Value Date   HEPBSAB REACTIVE (A) 09/02/2021   Lab Results  Component Value Date   LABRPR NON-REACTIVE 12/01/2021    CBC Lab Results  Component Value Date   WBC 6.7 12/01/2021   RBC 4.49 12/01/2021   HGB 14.0 12/01/2021   HCT 41.1 12/01/2021   PLT 257 12/01/2021   MCV 91.5 12/01/2021   MCH 31.2 12/01/2021   MCHC 34.1 12/01/2021   RDW 12.3 12/01/2021   LYMPHSABS 3,343 12/01/2021   MONOABS 0.7 11/06/2017   EOSABS 201 12/01/2021    BMET Lab Results  Component Value Date   NA 140 12/01/2021   K 4.2 12/01/2021   CL 104 12/01/2021   CO2 28 12/01/2021   GLUCOSE 112 (H) 12/01/2021   BUN 10 12/01/2021   CREATININE 0.68 12/01/2021   CALCIUM 10.0 12/01/2021   GFRNONAA 78 02/04/2021   GFRAA 91 02/04/2021      Assessment and Plan  #hiv Well controlled on cabenuva Will continue care here  -cabenuva today -q37month pharmacy visits  -6 months f/u with dr Julie Fox-labs today -discussed u=u -encourage compliance -continue current HIV medication   #hcm -vaccination Prevnar 20 today Tdap booster today Shingrix vaccine rx given and she can get done with pharmacy -cancer screening Pap/mammogram with her ob (s/p complete hysterectomy 2021 for fibroid/premalignant); mammogram 08/2022 normal

## 2022-12-06 LAB — COMPLETE METABOLIC PANEL WITH GFR
AG Ratio: 1.5 (calc) (ref 1.0–2.5)
ALT: 12 U/L (ref 6–29)
AST: 16 U/L (ref 10–35)
Albumin: 4.6 g/dL (ref 3.6–5.1)
Alkaline phosphatase (APISO): 98 U/L (ref 31–125)
BUN: 18 mg/dL (ref 7–25)
CO2: 27 mmol/L (ref 20–32)
Calcium: 11.1 mg/dL — ABNORMAL HIGH (ref 8.6–10.2)
Chloride: 102 mmol/L (ref 98–110)
Creat: 0.65 mg/dL (ref 0.50–0.99)
Globulin: 3 g/dL (calc) (ref 1.9–3.7)
Glucose, Bld: 81 mg/dL (ref 65–99)
Potassium: 4.4 mmol/L (ref 3.5–5.3)
Sodium: 139 mmol/L (ref 135–146)
Total Bilirubin: 0.4 mg/dL (ref 0.2–1.2)
Total Protein: 7.6 g/dL (ref 6.1–8.1)
eGFR: 111 mL/min/{1.73_m2} (ref >=60)

## 2022-12-06 LAB — CBC
HCT: 41 % (ref 35.0–45.0)
Hemoglobin: 13.6 g/dL (ref 11.7–15.5)
MCH: 30.4 pg (ref 27.0–33.0)
MCHC: 33.2 g/dL (ref 32.0–36.0)
MCV: 91.5 fL (ref 80.0–100.0)
MPV: 11.8 fL (ref 7.5–12.5)
Platelets: 291 10*3/uL (ref 140–400)
RBC: 4.48 10*6/uL (ref 3.80–5.10)
RDW: 12.8 % (ref 11.0–15.0)
WBC: 7 10*3/uL (ref 3.8–10.8)

## 2022-12-06 LAB — HIV-1 RNA QUANT-NO REFLEX-BLD
HIV 1 RNA Quant: NOT DETECTED Copies/mL
HIV-1 RNA Quant, Log: NOT DETECTED Log cps/mL

## 2023-01-26 ENCOUNTER — Ambulatory Visit (INDEPENDENT_AMBULATORY_CARE_PROVIDER_SITE_OTHER): Payer: 59 | Admitting: Pharmacist

## 2023-01-26 ENCOUNTER — Other Ambulatory Visit: Payer: Self-pay

## 2023-01-26 DIAGNOSIS — Z23 Encounter for immunization: Secondary | ICD-10-CM | POA: Diagnosis not present

## 2023-01-26 DIAGNOSIS — B20 Human immunodeficiency virus [HIV] disease: Secondary | ICD-10-CM | POA: Diagnosis not present

## 2023-01-26 MED ORDER — CABOTEGRAVIR & RILPIVIRINE ER 600 & 900 MG/3ML IM SUER
1.0000 | Freq: Once | INTRAMUSCULAR | Status: AC
Start: 2023-01-26 — End: 2023-01-26
  Administered 2023-01-26: 1 via INTRAMUSCULAR

## 2023-01-26 NOTE — Progress Notes (Unsigned)
HPI: Julie Fox is a 47 y.o. female who presents to the [facility] clinic for [place] administration.  Patient Active Problem List   Diagnosis Date Noted   Obesity with body mass index 30 or greater 07/27/2022   Easy bruising 07/27/2022   Eruption cyst 07/27/2022   Allergic rhinitis 07/27/2022   Acute otitis media 07/27/2022   Anemia 07/27/2022   Blood coagulation disorder 07/27/2022   Cellulitis 07/27/2022   Cough 07/27/2022   Depressive disorder 07/27/2022   Disorder of coccyx 07/27/2022   Dysphasia 07/27/2022   Eustachian tube dysfunction 07/27/2022   Genital herpes simplex 07/27/2022   Goiter 07/27/2022   Insomnia 07/27/2022   Migraine 07/27/2022   Malaise and fatigue 07/27/2022   Lymphadenopathy 07/27/2022   Neck pain 07/27/2022   Numbness 07/27/2022   Abscess of oral tissue 07/27/2022   Cellulitis of oral soft tissues 07/27/2022   Disorder of tongue 07/27/2022   Pharyngitis 07/27/2022   Sinusitis 07/27/2022   Human immunodeficiency virus infection 07/27/2022   Anxiety 04/05/2022   Morbid obesity with BMI of 45.0-49.9, adult 04/05/2022   HIV infection 04/05/2022   High cholesterol 04/05/2022   Pelvic mass in female    Transaminitis 08/18/2020   Vitamin D deficiency 08/18/2020   Class 3 severe obesity with serious comorbidity and body mass index (BMI) of 40.0 to 44.9 in adult 08/18/2020   Acid reflux 08/09/2018   History of adenomatous polyp of colon 07/12/2018   Internal hemorrhoids 07/12/2018   Mild single current episode of major depressive disorder 06/20/2018   Post-operative state 01/03/2018   Abnormal uterine bleeding (AUB) 12/12/2017   GAD (generalized anxiety disorder) 06/13/2017   Hyperlipidemia 06/13/2017   HIV disease 12/23/2016    Patient's Medications  New Prescriptions   No medications on file  Previous Medications   CABENUVA 600 & 900 MG/3ML INJECTION       OMEPRAZOLE (PRILOSEC) 40 MG CAPSULE    Take 40 mg by mouth daily.    SERTRALINE (ZOLOFT) 50 MG TABLET    Take 1 tablet (50 mg total) by mouth daily.   TRAZODONE (DESYREL) 50 MG TABLET    Take 50 mg by mouth at bedtime.  Modified Medications   No medications on file  Discontinued Medications   No medications on file    Allergies: Allergies  Allergen Reactions   Penicillins Anaphylaxis    Has patient had a PCN reaction causing immediate rash, facial/tongue/throat swelling, SOB or lightheadedness with hypotension: yes Has patient had a PCN reaction causing severe rash involving mucus membranes or skin necrosis: no Has patient had a PCN reaction that required hospitalization: no Has patient had a PCN reaction occurring within the last 10 years: yes If all of the above answers are "NO", then may proceed with Cephalosporin use.    Sulfa Antibiotics Rash    Past Medical History: Past Medical History:  Diagnosis Date   Allergy    Anxiety    Asthma    Chronic kidney disease    Depression    GAD (generalized anxiety disorder)    Gallbladder problem    GERD (gastroesophageal reflux disease)    no meds, diet controlled   Heartburn    High cholesterol    History of colon polyps    History of epilepsy    childhood seizures - last one age 24 yr, non since   HIV (human immunodeficiency virus infection) ([place])    Hx of abnormal cervical Pap smear    2012 per  pt, then normal since    Migraine    otc med prn   Obesity    Pneumonia    Pure hypercholesterolemia    Seasonal allergies    Seizures ([place])    Juvenile epilepsy   Sleep apnea    Vitamin B12 deficiency    Vitamin D deficiency     Social History: Social History   Socioeconomic History   Marital status: Single    Spouse name: Not on file   Number of children: Not on file   Years of education: Not on file   Highest education level: Not on file  Occupational History   Not on file  Tobacco Use   Smoking status: Former    Packs/day: 0.25    Years: 20.00    Additional pack years: 0.00     Total pack years: 5.00    Types: Cigarettes    Start date: 12/24/1995    Quit date: 10/08/2020    Years since quitting: 2.3   Smokeless tobacco: Never   Tobacco comments:    quit 09/2020  Vaping Use   Vaping Use: Never used  Substance and Sexual Activity   Alcohol use: No   Drug use: No   Sexual activity: Not Currently    Partners: Male    Birth control/protection: None    Comment: declined condoms  Other Topics Concern   Not on file  Social History Narrative   Work or School: Engineer, structural, 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: lives with mother      Spiritual Beliefs:      Lifestyle: no regular exercise, diet not great   Social Determinants of Health   Financial Resource Strain: Not on file  Food Insecurity: Not on file  Transportation Needs: Not on file  Physical Activity: Not on file  Stress: Not on file  Social Connections: Not on file    Labs: Lab Results  Component Value Date   HIV1RNAQUANT Not Detected 12/02/2022   HIV1RNAQUANT Not Detected 07/27/2022   HIV1RNAQUANT Not Detected 04/02/2022   CD4TABS 1,375 08/03/2021   CD4TABS 1,731 02/04/2021   CD4TABS 1,408 07/23/2020    RPR and STI Lab Results  Component Value Date   LABRPR NON-REACTIVE 12/01/2021   LABRPR NON-REACTIVE 08/03/2021   LABRPR NON-REACTIVE 02/04/2021   LABRPR NON-REACTIVE 07/23/2020   LABRPR NON-REACTIVE 06/04/2019    STI Results GC CT  04/02/2022 11:11 AM Negative  Negative   12/27/2018 12:00 AM Negative  Negative   09/27/2017 12:00 AM Negative  Negative   09/27/2016 12:00 AM Negative  Negative     Hepatitis B Lab Results  Component Value Date   HEPBSAB REACTIVE (A) 09/02/2021   HEPBSAG NON-REACTIVE 07/23/2020   HEPBCAB NON REACTIVE 09/27/2016   Hepatitis C Lab Results  Component Value Date   HEPCAB NON-REACTIVE 09/02/2021   Hepatitis A Lab Results  Component Value Date   HAV NON REACTIVE 09/27/2016   Lipids: Lab Results  Component Value Date   CHOL 221 (H)  12/01/2021   TRIG 154 (H) 12/01/2021   HDL 44 (L) 12/01/2021   CHOLHDL 5.0 (H) 12/01/2021   VLDL 26.2 06/20/2018   LDLCALC 148 (H) 12/01/2021    TARGET DATE: The 4th  Assessment: Julie Fox presents today for her maintenance Cabenuva injections. Past injections were tolerated well without issues. She is feeling well today.   Administered cabotegravir 600mg /45mL in left upper outer quadrant of the gluteal muscle. Administered rilpivirine 900 mg/23mL in the right upper  outer quadrant of the gluteal muscle. No issues with injections. She will follow up in 2 months for next set of injections.  Menveo vaccine was also administered today. Patient only had one documented vaccine of Menveo back in 2018, so will restart series.   Plan: - Cabenuva injections administered - Menveo vaccine administered - Next cab injections and second Menveo vaccine scheduled for 03/28/23 - Follow up with [name] scheduled for 05/26/2023 - Call with any issues or questions  Vicenta Dunning, PharmD  PGY1 Pharmacy Resident

## 2023-03-02 ENCOUNTER — Ambulatory Visit: Payer: 59 | Admitting: Internal Medicine

## 2023-03-24 ENCOUNTER — Telehealth: Payer: Self-pay

## 2023-03-24 NOTE — Telephone Encounter (Signed)
Pharmacy Patient Advocate Encounter- Julie Fox BIV-Medical Benefit:  J code: [id number]  CPT code: 40981  Dx Code: B20  No PA required through [facility] .      Julie Fox, CPhT Specialty Pharmacy Patient [facility] for Infectious Disease Phone: [phone] Fax:  [phone]

## 2023-03-28 ENCOUNTER — Ambulatory Visit (INDEPENDENT_AMBULATORY_CARE_PROVIDER_SITE_OTHER): Payer: 59 | Admitting: Pharmacist

## 2023-03-28 ENCOUNTER — Other Ambulatory Visit: Payer: Self-pay

## 2023-03-28 DIAGNOSIS — B20 Human immunodeficiency virus [HIV] disease: Secondary | ICD-10-CM | POA: Diagnosis not present

## 2023-03-28 MED ORDER — CABOTEGRAVIR & RILPIVIRINE ER 600 & 900 MG/3ML IM SUER
1.0000 | Freq: Once | INTRAMUSCULAR | Status: AC
Start: 2023-03-28 — End: 2023-03-28
  Administered 2023-03-28: 1 via INTRAMUSCULAR

## 2023-03-28 NOTE — Progress Notes (Signed)
HPI: Julie Fox is a 47 y.o. female who presents to the [facility] pharmacy clinic for [place] administration.  Patient Active Problem List   Diagnosis Date Noted   Obesity with body mass index 30 or greater 07/27/2022   Easy bruising 07/27/2022   Eruption cyst 07/27/2022   Allergic rhinitis 07/27/2022   Acute otitis media 07/27/2022   Anemia 07/27/2022   Blood coagulation disorder (HCC) 07/27/2022   Cellulitis 07/27/2022   Cough 07/27/2022   Depressive disorder 07/27/2022   Disorder of coccyx 07/27/2022   Dysphasia 07/27/2022   Eustachian tube dysfunction 07/27/2022   Genital herpes simplex 07/27/2022   Goiter 07/27/2022   Insomnia 07/27/2022   Migraine 07/27/2022   Malaise and fatigue 07/27/2022   Lymphadenopathy 07/27/2022   Neck pain 07/27/2022   Numbness 07/27/2022   Abscess of oral tissue 07/27/2022   Cellulitis of oral soft tissues 07/27/2022   Disorder of tongue 07/27/2022   Pharyngitis 07/27/2022   Sinusitis 07/27/2022   Human immunodeficiency virus infection (HCC) 07/27/2022   Anxiety 04/05/2022   Morbid obesity with BMI of 45.0-49.9, adult (HCC) 04/05/2022   HIV infection (HCC) 04/05/2022   High cholesterol 04/05/2022   Pelvic mass in female    Transaminitis 08/18/2020   Vitamin D deficiency 08/18/2020   Class 3 severe obesity with serious comorbidity and body mass index (BMI) of 40.0 to 44.9 in adult (HCC) 08/18/2020   Acid reflux 08/09/2018   History of adenomatous polyp of colon 07/12/2018   Internal hemorrhoids 07/12/2018   Mild single current episode of major depressive disorder (HCC) 06/20/2018   Post-operative state 01/03/2018   Abnormal uterine bleeding (AUB) 12/12/2017   GAD (generalized anxiety disorder) 06/13/2017   Hyperlipidemia 06/13/2017   HIV disease (HCC) 12/23/2016    Patient's Medications  New Prescriptions   No medications on file  Previous Medications   CABENUVA 600 & 900 MG/3ML INJECTION       OMEPRAZOLE (PRILOSEC) 40 MG  CAPSULE    Take 40 mg by mouth daily.   SERTRALINE (ZOLOFT) 50 MG TABLET    Take 1 tablet (50 mg total) by mouth daily.   TRAZODONE (DESYREL) 50 MG TABLET    Take 50 mg by mouth at bedtime.  Modified Medications   No medications on file  Discontinued Medications   No medications on file    Allergies: Allergies  Allergen Reactions   Penicillins Anaphylaxis    Has patient had a PCN reaction causing immediate rash, facial/tongue/throat swelling, SOB or lightheadedness with hypotension: yes Has patient had a PCN reaction causing severe rash involving mucus membranes or skin necrosis: no Has patient had a PCN reaction that required hospitalization: no Has patient had a PCN reaction occurring within the last 10 years: yes If all of the above answers are "NO", then may proceed with Cephalosporin use.    Sulfa Antibiotics Rash    Past Medical History: Past Medical History:  Diagnosis Date   Allergy    Anxiety    Asthma    Chronic kidney disease    Depression    GAD (generalized anxiety disorder)    Gallbladder problem    GERD (gastroesophageal reflux disease)    no meds, diet controlled   Heartburn    High cholesterol    History of colon polyps    History of epilepsy    childhood seizures - last one age 31 yr, non since   HIV (human immunodeficiency virus infection) (HCC)    Hx of abnormal cervical  Pap smear    2012 per pt, then normal since    Migraine    otc med prn   Obesity    Pneumonia    Pure hypercholesterolemia    Seasonal allergies    Seizures (HCC)    Juvenile epilepsy   Sleep apnea    Vitamin B12 deficiency    Vitamin D deficiency     Social History: Social History   Socioeconomic History   Marital status: Single    Spouse name: Not on file   Number of children: Not on file   Years of education: Not on file   Highest education level: Not on file  Occupational History   Not on file  Tobacco Use   Smoking status: Former    Packs/day: 0.25     Years: 20.00    Additional pack years: 0.00    Total pack years: 5.00    Types: Cigarettes    Start date: 12/24/1995    Quit date: 10/08/2020    Years since quitting: 2.4   Smokeless tobacco: Never   Tobacco comments:    quit 09/2020  Vaping Use   Vaping Use: Never used  Substance and Sexual Activity   Alcohol use: No   Drug use: No   Sexual activity: Not Currently    Partners: Male    Birth control/protection: None    Comment: declined condoms  Other Topics Concern   Not on file  Social History Narrative   Work or School: Emergency planning/management officer, 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: lives with mother      Spiritual Beliefs:      Lifestyle: no regular exercise, diet not great   Social Determinants of Health   Financial Resource Strain: Not on file  Food Insecurity: Not on file  Transportation Needs: Not on file  Physical Activity: Not on file  Stress: Not on file  Social Connections: Not on file    Labs: Lab Results  Component Value Date   HIV1RNAQUANT Not Detected 12/02/2022   HIV1RNAQUANT Not Detected 07/27/2022   HIV1RNAQUANT Not Detected 04/02/2022   CD4TABS 1,375 08/03/2021   CD4TABS 1,731 02/04/2021   CD4TABS 1,408 07/23/2020    RPR and STI Lab Results  Component Value Date   LABRPR NON-REACTIVE 12/01/2021   LABRPR NON-REACTIVE 08/03/2021   LABRPR NON-REACTIVE 02/04/2021   LABRPR NON-REACTIVE 07/23/2020   LABRPR NON-REACTIVE 06/04/2019    STI Results GC CT  04/02/2022 11:11 AM Negative  Negative   12/27/2018 12:00 AM Negative  Negative   09/27/2017 12:00 AM Negative  Negative   09/27/2016 12:00 AM Negative  Negative     Hepatitis B Lab Results  Component Value Date   HEPBSAB REACTIVE (A) 09/02/2021   HEPBSAG NON-REACTIVE 07/23/2020   HEPBCAB NON REACTIVE 09/27/2016   Hepatitis C Lab Results  Component Value Date   HEPCAB NON-REACTIVE 09/02/2021   Hepatitis A Lab Results  Component Value Date   HAV NON REACTIVE 09/27/2016   Lipids: Lab Results   Component Value Date   CHOL 221 (H) 12/01/2021   TRIG 154 (H) 12/01/2021   HDL 44 (L) 12/01/2021   CHOLHDL 5.0 (H) 12/01/2021   VLDL 26.2 06/20/2018   LDLCALC 148 (H) 12/01/2021    TARGET DATE: The 4th  Assessment: Julie Fox presents today for her maintenance Cabenuva injections. Past injections were tolerated well without issues. She does have several painless knots where the injection has been given in the past. I advised her that it  was normal to develop nodules and that they will eventually go away. She is also having tailbone pain. We both think it is from the weight loss she has had the last several months/years. She also sits a lot at work. She ordered a pillow to see if it works. I advised that a donut pillow may be helpful as well. Last HIV RNA was in February and was undetectable. Last CD4 count was 1,667 in February 2023. Will update labs today.   Administered cabotegravir 600mg /12mL in left upper outer quadrant of the gluteal muscle. Administered rilpivirine 900 mg/70mL in the right upper outer quadrant of the gluteal muscle. No issues with injections. She will follow up in 2 months for next set of injections.  Plan: - Cabenuva injections administered - HIV RNA and CD4 count today - Next injections scheduled for 05/26/23 with Dr. Drue Second and 07/26/23 with me - Call with any issues or questions  Denys Labree L. Anant Agard, PharmD, BCIDP, AAHIVP, CPP Clinical Pharmacist Practitioner Infectious Diseases Clinical Pharmacist [facility]

## 2023-03-29 LAB — T-HELPER CELL (CD4) - (RCID CLINIC ONLY)
CD4 % Helper T Cell: 53 % (ref 33–65)
CD4 T Cell Abs: 1613 /uL (ref 400–1790)

## 2023-04-02 LAB — HIV-1 RNA QUANT-NO REFLEX-BLD
HIV 1 RNA Quant: NOT DETECTED Copies/mL
HIV-1 RNA Quant, Log: NOT DETECTED Log cps/mL

## 2023-04-07 NOTE — Progress Notes (Signed)
The 10-year ASCVD risk score (Arnett DK, et al., 2019) is: 1.9%   Values used to calculate the score:     Age: 47 years     Sex: Female     Is Non-Hispanic African American: No     Diabetic: Yes     Tobacco smoker: No     Systolic Blood Pressure: 109 mmHg     Is BP treated: No     HDL Cholesterol: 37 MG/DL     Total Cholesterol: 185 MG/DL  Sandie Ano, RN

## 2023-05-26 ENCOUNTER — Ambulatory Visit: Payer: 59 | Admitting: Internal Medicine

## 2023-05-26 ENCOUNTER — Ambulatory Visit (INDEPENDENT_AMBULATORY_CARE_PROVIDER_SITE_OTHER): Payer: 59 | Admitting: Pharmacist

## 2023-05-26 ENCOUNTER — Other Ambulatory Visit: Payer: Self-pay

## 2023-05-26 DIAGNOSIS — B20 Human immunodeficiency virus [HIV] disease: Secondary | ICD-10-CM | POA: Diagnosis not present

## 2023-05-26 MED ORDER — CABOTEGRAVIR & RILPIVIRINE ER 600 & 900 MG/3ML IM SUER
1.0000 | Freq: Once | INTRAMUSCULAR | Status: AC
Start: 2023-05-26 — End: 2023-05-26
  Administered 2023-05-26: 1 via INTRAMUSCULAR

## 2023-05-26 NOTE — Progress Notes (Signed)
HPI: Julie Fox is a 47 y.o. female who presents to the [facility] pharmacy clinic for [place] administration.  Patient Active Problem List   Diagnosis Date Noted   Obesity with body mass index 30 or greater 07/27/2022   Easy bruising 07/27/2022   Eruption cyst 07/27/2022   Allergic rhinitis 07/27/2022   Acute otitis media 07/27/2022   Anemia 07/27/2022   Blood coagulation disorder (HCC) 07/27/2022   Cellulitis 07/27/2022   Cough 07/27/2022   Depressive disorder 07/27/2022   Disorder of coccyx 07/27/2022   Dysphasia 07/27/2022   Eustachian tube dysfunction 07/27/2022   Genital herpes simplex 07/27/2022   Goiter 07/27/2022   Insomnia 07/27/2022   Migraine 07/27/2022   Malaise and fatigue 07/27/2022   Lymphadenopathy 07/27/2022   Neck pain 07/27/2022   Numbness 07/27/2022   Abscess of oral tissue 07/27/2022   Cellulitis of oral soft tissues 07/27/2022   Disorder of tongue 07/27/2022   Pharyngitis 07/27/2022   Sinusitis 07/27/2022   Human immunodeficiency virus infection (HCC) 07/27/2022   Anxiety 04/05/2022   Morbid obesity with BMI of 45.0-49.9, adult (HCC) 04/05/2022   HIV infection (HCC) 04/05/2022   High cholesterol 04/05/2022   Pelvic mass in female    Transaminitis 08/18/2020   Vitamin D deficiency 08/18/2020   Class 3 severe obesity with serious comorbidity and body mass index (BMI) of 40.0 to 44.9 in adult (HCC) 08/18/2020   Acid reflux 08/09/2018   History of adenomatous polyp of colon 07/12/2018   Internal hemorrhoids 07/12/2018   Mild single current episode of major depressive disorder (HCC) 06/20/2018   Post-operative state 01/03/2018   Abnormal uterine bleeding (AUB) 12/12/2017   GAD (generalized anxiety disorder) 06/13/2017   Hyperlipidemia 06/13/2017   HIV disease (HCC) 12/23/2016    Patient's Medications  New Prescriptions   No medications on file  Previous Medications   CABENUVA 600 & 900 MG/3ML INJECTION       OMEPRAZOLE (PRILOSEC) 40 MG  CAPSULE    Take 40 mg by mouth daily.   SERTRALINE (ZOLOFT) 50 MG TABLET    Take 1 tablet (50 mg total) by mouth daily.   TRAZODONE (DESYREL) 50 MG TABLET    Take 50 mg by mouth at bedtime.  Modified Medications   No medications on file  Discontinued Medications   No medications on file    Allergies: Allergies  Allergen Reactions   Penicillins Anaphylaxis    Has patient had a PCN reaction causing immediate rash, facial/tongue/throat swelling, SOB or lightheadedness with hypotension: yes Has patient had a PCN reaction causing severe rash involving mucus membranes or skin necrosis: no Has patient had a PCN reaction that required hospitalization: no Has patient had a PCN reaction occurring within the last 10 years: yes If all of the above answers are "NO", then may proceed with Cephalosporin use.    Sulfa Antibiotics Rash    Past Medical History: Past Medical History:  Diagnosis Date   Allergy    Anxiety    Asthma    Chronic kidney disease    Depression    GAD (generalized anxiety disorder)    Gallbladder problem    GERD (gastroesophageal reflux disease)    no meds, diet controlled   Heartburn    High cholesterol    History of colon polyps    History of epilepsy    childhood seizures - last one age 76 yr, non since   HIV (human immunodeficiency virus infection) (HCC)    Hx of abnormal cervical  Pap smear    2012 per pt, then normal since    Migraine    otc med prn   Obesity    Pneumonia    Pure hypercholesterolemia    Seasonal allergies    Seizures (HCC)    Juvenile epilepsy   Sleep apnea    Vitamin B12 deficiency    Vitamin D deficiency     Social History: Social History   Socioeconomic History   Marital status: Single    Spouse name: Not on file   Number of children: Not on file   Years of education: Not on file   Highest education level: Not on file  Occupational History   Not on file  Tobacco Use   Smoking status: Former    Current packs/day: 0.00     Average packs/day: 0.3 packs/day for 24.8 years (6.2 ttl pk-yrs)    Types: Cigarettes    Start date: 12/24/1995    Quit date: 10/08/2020    Years since quitting: 2.6   Smokeless tobacco: Never   Tobacco comments:    quit 09/2020  Vaping Use   Vaping status: Never Used  Substance and Sexual Activity   Alcohol use: No   Drug use: No   Sexual activity: Not Currently    Partners: Male    Birth control/protection: None    Comment: declined condoms  Other Topics Concern   Not on file  Social History Narrative   Work or School: Emergency planning/management officer, 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: lives with mother      Spiritual Beliefs:      Lifestyle: no regular exercise, diet not great   Social Determinants of Health   Financial Resource Strain: Not on file  Food Insecurity: Low Risk  (11/11/2022)   [facility]   Food vital sign    Within the past 12 months, you worried that your food would run out before you got money to buy more: Never true    Within the past 12 months, the food you bought just didn't last and you didn't have money to get more: Not on file  Transportation Needs: Not on file (11/11/2022)  Physical Activity: Not on file  Stress: Not on file  Social Connections: Unknown (03/04/2022)   [facility]   Social Network    Social Network: Not on file    Labs: Lab Results  Component Value Date   HIV1RNAQUANT Not Detected 03/28/2023   HIV1RNAQUANT Not Detected 12/02/2022   HIV1RNAQUANT Not Detected 07/27/2022   CD4TABS 1,613 03/28/2023   CD4TABS 1,375 08/03/2021   CD4TABS 1,731 02/04/2021    RPR and STI Lab Results  Component Value Date   LABRPR NON-REACTIVE 12/01/2021   LABRPR NON-REACTIVE 08/03/2021   LABRPR NON-REACTIVE 02/04/2021   LABRPR NON-REACTIVE 07/23/2020   LABRPR NON-REACTIVE 06/04/2019    STI Results GC CT  04/02/2022 11:11 AM Negative  Negative   12/27/2018 12:00 AM Negative  Negative   09/27/2017 12:00 AM  Negative  Negative   09/27/2016 12:00 AM Negative  Negative     Hepatitis B Lab Results  Component Value Date   HEPBSAB REACTIVE (A) 09/02/2021   HEPBSAG NON-REACTIVE 07/23/2020   HEPBCAB NON REACTIVE 09/27/2016   Hepatitis C Lab Results  Component Value Date   HEPCAB NON-REACTIVE 09/02/2021   Hepatitis A Lab Results  Component Value Date   HAV NON REACTIVE 09/27/2016   Lipids: Lab Results  Component Value Date  CHOL 221 (H) 12/01/2021   TRIG 154 (H) 12/01/2021   HDL 44 (L) 12/01/2021   CHOLHDL 5.0 (H) 12/01/2021   VLDL 26.2 06/20/2018   LDLCALC 148 (H) 12/01/2021    TARGET DATE:  The 4th of the month  Current HIV Regimen: Cabenuva  Assessment: Julie Fox presents today for their maintenance Cabenuva injections. Initial/past injections were tolerated well without issues. No problems with systemic effects of injections. Felt knot on her right side when preparing for rilpivirine injection; she denied any tenderness or discomfort with it.    Administered cabotegravir 600mg /26mL in left upper outer quadrant of the gluteal muscle. Administered rilpivirine 900 mg/72mL in the right upper outer quadrant of the gluteal muscle. Monitored patient for 10 minutes after injection. Injections were tolerated well without issue. Patient will follow up in 2 months for next injection. Will defer HIV RNA testing today as it was recently assessed in June. Scheduling a routine follow up with Dr. Drue Second next month as she has not followed with her since October 2023 due to scheduling conflicts. Will see Cassie in 2 months for next injection.   States she received her first Shingrix vaccine on July 18th and tolerated it well.   Plan: - Cabenuva injections administered - Follow up with Dr. Drue Second on 9/16 - Next injections scheduled for 10/1 with Cassie - Call with any issues or questions  Margarite Gouge, PharmD, CPP, BCIDP, AAHIVP Clinical Pharmacist Practitioner Infectious Diseases Clinical  Pharmacist [facility]

## 2023-07-11 ENCOUNTER — Encounter: Payer: Self-pay | Admitting: Internal Medicine

## 2023-07-11 ENCOUNTER — Ambulatory Visit: Payer: 59 | Admitting: Internal Medicine

## 2023-07-11 ENCOUNTER — Other Ambulatory Visit: Payer: Self-pay

## 2023-07-11 VITALS — BP 110/78 | HR 81 | Temp 97.6°F | Resp 16 | Wt 177.0 lb

## 2023-07-11 DIAGNOSIS — Z23 Encounter for immunization: Secondary | ICD-10-CM | POA: Diagnosis not present

## 2023-07-11 DIAGNOSIS — Z79899 Other long term (current) drug therapy: Secondary | ICD-10-CM

## 2023-07-11 DIAGNOSIS — B20 Human immunodeficiency virus [HIV] disease: Secondary | ICD-10-CM

## 2023-07-11 DIAGNOSIS — I951 Orthostatic hypotension: Secondary | ICD-10-CM

## 2023-07-11 NOTE — Progress Notes (Unsigned)
RFV: follow up for hiv disease  Patient ID: Julie Fox, female   DOB: Jul 25, 1976, 47 y.o.   MRN: [id number]  HPI Julie Fox is a 47yo F on cabenuva injections. Cd 4 count of 1613/VL<20 (in June 2024). No issues with injections. She feels her weight has stabilized since having gastric bypass surgery in oct 2023 -- now at 1 year mark. #142 lb loss since surgery. She has still some side effects associated with gastric bypass  ROS:  buttock pain( somewhat from weight loss) ; occasional lightheadedness from standing up quickly   Outpatient Encounter Medications as of 07/11/2023  Medication Sig   CABENUVA 600 & 900 MG/3ML injection    omeprazole (PRILOSEC) 40 MG capsule Take 40 mg by mouth daily.   sertraline (ZOLOFT) 50 MG tablet Take 1 tablet (50 mg total) by mouth daily.   traZODone (DESYREL) 50 MG tablet Take 50 mg by mouth at bedtime.   No facility-administered encounter medications on file as of 07/11/2023.     Patient Active Problem List   Diagnosis Date Noted   Obesity with body mass index 30 or greater 07/27/2022   Easy bruising 07/27/2022   Eruption cyst 07/27/2022   Allergic rhinitis 07/27/2022   Acute otitis media 07/27/2022   Anemia 07/27/2022   Blood coagulation disorder (HCC) 07/27/2022   Cellulitis 07/27/2022   Cough 07/27/2022   Depressive disorder 07/27/2022   Disorder of coccyx 07/27/2022   Dysphasia 07/27/2022   Eustachian tube dysfunction 07/27/2022   Genital herpes simplex 07/27/2022   Goiter 07/27/2022   Insomnia 07/27/2022   Migraine 07/27/2022   Malaise and fatigue 07/27/2022   Lymphadenopathy 07/27/2022   Neck pain 07/27/2022   Numbness 07/27/2022   Abscess of oral tissue 07/27/2022   Cellulitis of oral soft tissues 07/27/2022   Disorder of tongue 07/27/2022   Pharyngitis 07/27/2022   Sinusitis 07/27/2022   Human immunodeficiency virus infection (HCC) 07/27/2022   Anxiety 04/05/2022   Morbid obesity with BMI of 45.0-49.9, adult (HCC)  04/05/2022   HIV infection (HCC) 04/05/2022   High cholesterol 04/05/2022   Pelvic mass in female    Transaminitis 08/18/2020   Vitamin D deficiency 08/18/2020   Class 3 severe obesity with serious comorbidity and body mass index (BMI) of 40.0 to 44.9 in adult (HCC) 08/18/2020   Acid reflux 08/09/2018   History of adenomatous polyp of colon 07/12/2018   Internal hemorrhoids 07/12/2018   Mild single current episode of major depressive disorder (HCC) 06/20/2018   Post-operative state 01/03/2018   Abnormal uterine bleeding (AUB) 12/12/2017   GAD (generalized anxiety disorder) 06/13/2017   Hyperlipidemia 06/13/2017   HIV disease (HCC) 12/23/2016     Health Maintenance Due  Topic Date Due   PAP SMEAR-Modifier  06/13/2020   INFLUENZA VACCINE  05/26/2023   COVID-19 Vaccine (4 - 2023-24 season) 06/26/2023     Review of Systems 12 point ros is negative except what is mentioned above Physical Exam  BP 110/78   Pulse 81   Temp 97.6 F (36.4 C) (Temporal)   Resp 16   Wt 177 lb (80.3 kg)   LMP 12/26/2017   SpO2 98%   BMI 26.91 kg/m  Physical Exam  Constitutional:  oriented to person, place, and time. appears well-developed and well-nourished. No distress.  HENT: [place]/AT, PERRLA, no scleral icterus Mouth/Throat: Oropharynx is clear and moist. No oropharyngeal exudate.  Cardiovascular: Normal rate, regular rhythm and normal heart sounds. Exam reveals no gallop and no friction rub.  No  murmur heard.  Pulmonary/Chest: Effort normal and breath sounds normal. No respiratory distress.  has no wheezes.  Neck = supple, no nuchal rigidity Lymphadenopathy: no cervical adenopathy. No axillary adenopathy Neurological: alert and oriented to person, place, and time.  Skin: Skin is warm and dry. No rash noted. No erythema.  Psychiatric: a normal mood and affect.  behavior is normal.    Lab Results  Component Value Date   CD4TCELL 53 03/28/2023   Lab Results  Component Value Date   CD4TABS  1,613 03/28/2023   CD4TABS 1,375 08/03/2021   CD4TABS 1,731 02/04/2021   Lab Results  Component Value Date   HIV1RNAQUANT Not Detected 03/28/2023   Lab Results  Component Value Date   HEPBSAB REACTIVE (A) 09/02/2021   Lab Results  Component Value Date   LABRPR NON-REACTIVE 12/01/2021    CBC Lab Results  Component Value Date   WBC 7.0 12/02/2022   RBC 4.48 12/02/2022   HGB 13.6 12/02/2022   HCT 41.0 12/02/2022   PLT 291 12/02/2022   MCV 91.5 12/02/2022   MCH 30.4 12/02/2022   MCHC 33.2 12/02/2022   RDW 12.8 12/02/2022   LYMPHSABS 3,343 12/01/2021   MONOABS 0.7 11/06/2017   EOSABS 201 12/01/2021    BMET Lab Results  Component Value Date   NA 139 12/02/2022   K 4.4 12/02/2022   CL 102 12/02/2022   CO2 27 12/02/2022   GLUCOSE 81 12/02/2022   BUN 18 12/02/2022   CREATININE 0.65 12/02/2022   CALCIUM 11.1 (H) 12/02/2022   GFRNONAA 78 02/04/2021   GFRAA 91 02/04/2021      Assessment and Plan - HIV disease =will check HIV VL,cbc, cmp -today  - next cabenuva injection on oct 1st (then pharmacy again in early dec) and I will see her in early feb  Long term medication management = will check cr function  Health maintenance =  - flu today; 2nd shingle shot next week; covid recommended  Symptomatic postural hypotension, presumed = recommend slow transitions between supine to standing. And can consider compression socks at work.

## 2023-07-13 ENCOUNTER — Ambulatory Visit: Payer: 59 | Admitting: Obstetrics & Gynecology

## 2023-07-13 LAB — CBC WITH DIFFERENTIAL/PLATELET
Absolute Monocytes: 288 {cells}/uL (ref 200–950)
Basophils Absolute: 42 {cells}/uL (ref 0–200)
Basophils Relative: 0.7 %
Eosinophils Absolute: 120 {cells}/uL (ref 15–500)
Eosinophils Relative: 2 %
HCT: 39.1 % (ref 35.0–45.0)
Hemoglobin: 12.8 g/dL (ref 11.7–15.5)
Lymphs Abs: 2958 {cells}/uL (ref 850–3900)
MCH: 29.6 pg (ref 27.0–33.0)
MCHC: 32.7 g/dL (ref 32.0–36.0)
MCV: 90.3 fL (ref 80.0–100.0)
MPV: 11.6 fL (ref 7.5–12.5)
Monocytes Relative: 4.8 %
Neutro Abs: 2592 {cells}/uL (ref 1500–7800)
Neutrophils Relative %: 43.2 %
Platelets: 274 10*3/uL (ref 140–400)
RBC: 4.33 10*6/uL (ref 3.80–5.10)
RDW: 13.1 % (ref 11.0–15.0)
Total Lymphocyte: 49.3 %
WBC: 6 10*3/uL (ref 3.8–10.8)

## 2023-07-22 ENCOUNTER — Ambulatory Visit: Payer: 59 | Admitting: Obstetrics and Gynecology

## 2023-07-22 NOTE — Progress Notes (Unsigned)
HPI: Julie Fox is a 47 y.o. female who presents to the [facility] pharmacy clinic for [place] administration.  Patient Active Problem List   Diagnosis Date Noted   Obesity with body mass index 30 or greater 07/27/2022   Easy bruising 07/27/2022   Eruption cyst 07/27/2022   Allergic rhinitis 07/27/2022   Acute otitis media 07/27/2022   Anemia 07/27/2022   Blood coagulation disorder (HCC) 07/27/2022   Cellulitis 07/27/2022   Cough 07/27/2022   Depressive disorder 07/27/2022   Disorder of coccyx 07/27/2022   Dysphasia 07/27/2022   Eustachian tube dysfunction 07/27/2022   Genital herpes simplex 07/27/2022   Goiter 07/27/2022   Insomnia 07/27/2022   Migraine 07/27/2022   Malaise and fatigue 07/27/2022   Lymphadenopathy 07/27/2022   Neck pain 07/27/2022   Numbness 07/27/2022   Abscess of oral tissue 07/27/2022   Cellulitis of oral soft tissues 07/27/2022   Disorder of tongue 07/27/2022   Pharyngitis 07/27/2022   Sinusitis 07/27/2022   Human immunodeficiency virus infection (HCC) 07/27/2022   Anxiety 04/05/2022   Morbid obesity with BMI of 45.0-49.9, adult (HCC) 04/05/2022   HIV infection (HCC) 04/05/2022   High cholesterol 04/05/2022   Pelvic mass in female    Transaminitis 08/18/2020   Vitamin D deficiency 08/18/2020   Class 3 severe obesity with serious comorbidity and body mass index (BMI) of 40.0 to 44.9 in adult (HCC) 08/18/2020   Acid reflux 08/09/2018   History of adenomatous polyp of colon 07/12/2018   Internal hemorrhoids 07/12/2018   Mild single current episode of major depressive disorder (HCC) 06/20/2018   Post-operative state 01/03/2018   Abnormal uterine bleeding (AUB) 12/12/2017   GAD (generalized anxiety disorder) 06/13/2017   Hyperlipidemia 06/13/2017   HIV disease (HCC) 12/23/2016    Patient's Medications  New Prescriptions   No medications on file  Previous Medications   CABENUVA 600 & 900 MG/3ML INJECTION       OMEPRAZOLE (PRILOSEC) 40 MG  CAPSULE    Take 40 mg by mouth daily.   SERTRALINE (ZOLOFT) 50 MG TABLET    Take 1 tablet (50 mg total) by mouth daily.   TRAZODONE (DESYREL) 50 MG TABLET    Take 50 mg by mouth at bedtime.  Modified Medications   No medications on file  Discontinued Medications   No medications on file    Allergies: Allergies  Allergen Reactions   Penicillins Anaphylaxis    Has patient had a PCN reaction causing immediate rash, facial/tongue/throat swelling, SOB or lightheadedness with hypotension: yes Has patient had a PCN reaction causing severe rash involving mucus membranes or skin necrosis: no Has patient had a PCN reaction that required hospitalization: no Has patient had a PCN reaction occurring within the last 10 years: yes If all of the above answers are "NO", then may proceed with Cephalosporin use.    Nsaids Other (See Comments)   Sulfa Antibiotics Rash    Labs: Lab Results  Component Value Date   HIV1RNAQUANT Not Detected 07/11/2023   HIV1RNAQUANT Not Detected 03/28/2023   HIV1RNAQUANT Not Detected 12/02/2022   CD4TABS 1,613 03/28/2023   CD4TABS 1,375 08/03/2021   CD4TABS 1,731 02/04/2021    RPR and STI Lab Results  Component Value Date   LABRPR NON-REACTIVE 12/01/2021   LABRPR NON-REACTIVE 08/03/2021   LABRPR NON-REACTIVE 02/04/2021   LABRPR NON-REACTIVE 07/23/2020   LABRPR NON-REACTIVE 06/04/2019    STI Results GC CT  04/02/2022 11:11 AM Negative  Negative   12/27/2018 12:00 AM Negative  Negative  09/27/2017 12:00 AM Negative  Negative   09/27/2016 12:00 AM Negative  Negative     Hepatitis B Lab Results  Component Value Date   HEPBSAB REACTIVE (A) 09/02/2021   HEPBSAG NON-REACTIVE 07/23/2020   HEPBCAB NON REACTIVE 09/27/2016   Hepatitis C Lab Results  Component Value Date   HEPCAB NON-REACTIVE 09/02/2021   Hepatitis A Lab Results  Component Value Date   HAV NON REACTIVE 09/27/2016   Lipids: Lab Results  Component Value Date   CHOL 221 (H) 12/01/2021    TRIG 154 (H) 12/01/2021   HDL 44 (L) 12/01/2021   CHOLHDL 5.0 (H) 12/01/2021   VLDL 26.2 06/20/2018   LDLCALC 148 (H) 12/01/2021    TARGET DATE: The 1st  Assessment: Braidyn presents today for her maintenance Cabenuva injections. Past injections were tolerated well without issues. Last HIV RNA was checked a few weeks ago and was undetectable. Received flu vaccine a few weeks ago as well. Declines COVID vaccine but did get her 2nd Shingles vaccine a few weeks ago.   Administered cabotegravir 600mg /4mL in left upper outer quadrant of the gluteal muscle. Administered rilpivirine 900 mg/102mL in the right upper outer quadrant of the gluteal muscle. No issues with injections. She will follow up in 2 months for next set of injections.  Plan: - Cabenuva injections administered - Next injections scheduled for 09/28/23 with me and 11/30/23 with Dr. Drue Second  - Call with any issues or questions  Lynzi Meulemans L. Davinity Fanara, PharmD, BCIDP, AAHIVP, CPP Clinical Pharmacist Practitioner Infectious Diseases Clinical Pharmacist [facility]

## 2023-07-26 ENCOUNTER — Ambulatory Visit: Payer: 59 | Admitting: Pharmacist

## 2023-07-26 ENCOUNTER — Other Ambulatory Visit: Payer: Self-pay

## 2023-07-26 DIAGNOSIS — B20 Human immunodeficiency virus [HIV] disease: Secondary | ICD-10-CM

## 2023-07-26 MED ORDER — CABOTEGRAVIR & RILPIVIRINE ER 600 & 900 MG/3ML IM SUER
1.0000 | Freq: Once | INTRAMUSCULAR | Status: AC
Start: 2023-07-26 — End: 2023-07-26
  Administered 2023-07-26: 1 via INTRAMUSCULAR

## 2023-07-27 ENCOUNTER — Ambulatory Visit: Payer: 59 | Admitting: Obstetrics & Gynecology

## 2023-08-10 ENCOUNTER — Other Ambulatory Visit: Payer: Self-pay | Admitting: Obstetrics & Gynecology

## 2023-08-10 DIAGNOSIS — Z1231 Encounter for screening mammogram for malignant neoplasm of breast: Secondary | ICD-10-CM

## 2023-08-17 ENCOUNTER — Encounter: Payer: Self-pay | Admitting: Obstetrics & Gynecology

## 2023-08-17 ENCOUNTER — Ambulatory Visit: Payer: 59 | Admitting: Obstetrics & Gynecology

## 2023-08-17 VITALS — BP 114/66 | HR 70 | Ht 68.0 in | Wt 175.0 lb

## 2023-08-17 DIAGNOSIS — Z01419 Encounter for gynecological examination (general) (routine) without abnormal findings: Secondary | ICD-10-CM

## 2023-08-17 NOTE — Progress Notes (Signed)
Subjective:     Julie Fox is a 47 y.o. female here for a routine exam.  Current complaints: none. Pt has lost >120#s since her last visit. She reports that she feels better. Her liver enzymes are better and her glucose and mood are better.    Pt does c/o some vaginal dryness. She denies incontinence.     Gynecologic History Patient's last menstrual period was 12/26/2017. Contraception: status post hysterectomy Last Pap: n/a.  Last mammogram: 09/23/2022. Results were: normal  Obstetric History OB History  Gravida Para Term Preterm AB Living  0 0 0 0 0 0  SAB IAB Ectopic Multiple Live Births  0 0 0 0 0    The following portions of the patient's history were reviewed and updated as appropriate: allergies, current medications, past family history, past medical history, past social history, past surgical history, and problem list.  Review of Systems Pertinent items are noted in HPI.    Objective:  BP 114/66   Pulse 70   Ht 5\' 8"  (1.727 m)   Wt 175 lb (79.4 kg)   LMP 12/26/2017   BMI 26.61 kg/m  General Appearance:    Alert, cooperative, no distress, appears stated age  Head:    Normocephalic, without obvious abnormality, atraumatic  Eyes:    conjunctiva/corneas clear, EOM's intact, both eyes  Ears:    Normal external ear canals, both ears  Nose:   Nares normal, septum midline, mucosa normal, no drainage    or sinus tenderness  Throat:   Lips, mucosa, and tongue normal; teeth and gums normal  Neck:   Supple, symmetrical, trachea midline, no adenopathy;    thyroid:  no enlargement/tenderness/nodules  Back:     Symmetric, no curvature, ROM normal, no CVA tenderness  Lungs:     respirations unlabored  Chest Wall:    No tenderness or deformity   Heart:    Regular rate and rhythm  Breast Exam:    No tenderness, masses, or nipple abnormality  Abdomen:     Soft, non-tender, bowel sounds active all four quadrants,    no masses, no organomegaly  Genitalia:    Normal female  without lesion, discharge or tenderness   Uterus and cervix surgically absent.   Extremities:   Extremities normal, atraumatic, no cyanosis or edema  Pulses:   2+ and symmetric all extremities  Skin:   Skin color, texture, turgor normal, no rashes or lesions     Assessment:    Healthy female exam.  Colonoscopy 2019   Plan:  Screening  mammogram in Nov F/u in 1 year or sooner prn Screening test all UTD.   Tyrese Capriotti L. Harraway-Smith, M.D., [place]

## 2023-09-26 ENCOUNTER — Ambulatory Visit
Admission: RE | Admit: 2023-09-26 | Discharge: 2023-09-26 | Disposition: A | Discharge status: Home / Self Care | Payer: 59 | Source: Ambulatory Visit | Attending: Obstetrics & Gynecology | Admitting: Obstetrics & Gynecology

## 2023-09-26 DIAGNOSIS — Z1231 Encounter for screening mammogram for malignant neoplasm of breast: Secondary | ICD-10-CM

## 2023-09-27 NOTE — Progress Notes (Signed)
HPI: Julie Fox is a 47 y.o. female who presents to the [facility] pharmacy clinic for [place] administration.  Patient Active Problem List   Diagnosis Date Noted   Obesity with body mass index 30 or greater 07/27/2022   Easy bruising 07/27/2022   Eruption cyst 07/27/2022   Allergic rhinitis 07/27/2022   Acute otitis media 07/27/2022   Anemia 07/27/2022   Blood coagulation disorder (HCC) 07/27/2022   Cellulitis 07/27/2022   Cough 07/27/2022   Depressive disorder 07/27/2022   Disorder of coccyx 07/27/2022   Dysphasia 07/27/2022   Eustachian tube dysfunction 07/27/2022   Genital herpes simplex 07/27/2022   Goiter 07/27/2022   Insomnia 07/27/2022   Migraine 07/27/2022   Malaise and fatigue 07/27/2022   Lymphadenopathy 07/27/2022   Neck pain 07/27/2022   Numbness 07/27/2022   Abscess of oral tissue 07/27/2022   Cellulitis of oral soft tissues 07/27/2022   Disorder of tongue 07/27/2022   Pharyngitis 07/27/2022   Sinusitis 07/27/2022   Human immunodeficiency virus infection (HCC) 07/27/2022   Anxiety 04/05/2022   Morbid obesity with BMI of 45.0-49.9, adult (HCC) 04/05/2022   HIV infection (HCC) 04/05/2022   High cholesterol 04/05/2022   Pelvic mass in female    Transaminitis 08/18/2020   Vitamin D deficiency 08/18/2020   Class 3 severe obesity with serious comorbidity and body mass index (BMI) of 40.0 to 44.9 in adult (HCC) 08/18/2020   Acid reflux 08/09/2018   History of adenomatous polyp of colon 07/12/2018   Internal hemorrhoids 07/12/2018   Mild single current episode of major depressive disorder (HCC) 06/20/2018   Post-operative state 01/03/2018   Abnormal uterine bleeding (AUB) 12/12/2017   GAD (generalized anxiety disorder) 06/13/2017   Hyperlipidemia 06/13/2017   HIV disease (HCC) 12/23/2016    Patient's Medications  New Prescriptions   No medications on file  Previous Medications   CABENUVA 600 & 900 MG/3ML INJECTION       OMEPRAZOLE (PRILOSEC) 40 MG  CAPSULE    Take 40 mg by mouth daily.   SERTRALINE (ZOLOFT) 50 MG TABLET    Take 1 tablet (50 mg total) by mouth daily.   TRAZODONE (DESYREL) 50 MG TABLET    Take 50 mg by mouth at bedtime.  Modified Medications   No medications on file  Discontinued Medications   No medications on file    Allergies: Allergies  Allergen Reactions   Penicillins Anaphylaxis    Has patient had a PCN reaction causing immediate rash, facial/tongue/throat swelling, SOB or lightheadedness with hypotension: yes Has patient had a PCN reaction causing severe rash involving mucus membranes or skin necrosis: no Has patient had a PCN reaction that required hospitalization: no Has patient had a PCN reaction occurring within the last 10 years: yes If all of the above answers are "NO", then may proceed with Cephalosporin use.    Nsaids Other (See Comments)   Sulfa Antibiotics Rash    Labs: Lab Results  Component Value Date   HIV1RNAQUANT Not Detected 07/11/2023   HIV1RNAQUANT Not Detected 03/28/2023   HIV1RNAQUANT Not Detected 12/02/2022   CD4TABS 1,613 03/28/2023   CD4TABS 1,375 08/03/2021   CD4TABS 1,731 02/04/2021    RPR and STI Lab Results  Component Value Date   LABRPR NON-REACTIVE 12/01/2021   LABRPR NON-REACTIVE 08/03/2021   LABRPR NON-REACTIVE 02/04/2021   LABRPR NON-REACTIVE 07/23/2020   LABRPR NON-REACTIVE 06/04/2019    STI Results GC CT  04/02/2022 11:11 AM Negative  Negative   12/27/2018 12:00 AM Negative  Negative  09/27/2017 12:00 AM Negative  Negative   09/27/2016 12:00 AM Negative  Negative     Hepatitis B Lab Results  Component Value Date   HEPBSAB REACTIVE (A) 09/02/2021   HEPBSAG NON-REACTIVE 07/23/2020   HEPBCAB NON REACTIVE 09/27/2016   Hepatitis C Lab Results  Component Value Date   HEPCAB NON-REACTIVE 09/02/2021   Hepatitis A Lab Results  Component Value Date   HAV NON REACTIVE 09/27/2016   Lipids: Lab Results  Component Value Date   CHOL 221 (H) 12/01/2021    TRIG 154 (H) 12/01/2021   HDL 44 (L) 12/01/2021   CHOLHDL 5.0 (H) 12/01/2021   VLDL 26.2 06/20/2018   LDLCALC 148 (H) 12/01/2021    TARGET DATE: The 4th  Assessment: [name] presents today for her maintenance Cabenuva injections. Past injections were tolerated well without issues.  Administered cabotegravir 600mg /70mL in left upper outer quadrant of the gluteal muscle. Administered rilpivirine 900 mg/73mL in the right upper outer quadrant of the gluteal muscle. No issues with injections. *** will follow up in 2 months for next set of injections.  Plan: - Cabenuva injections administered - Next injections scheduled for 11/30/23 with Dr. Drue Second and *** with me - Call with any issues or questions  Julie Fox L. Evans Levee, PharmD, BCIDP, AAHIVP, CPP Clinical Pharmacist Practitioner Infectious Diseases Clinical Pharmacist [facility]

## 2023-09-28 ENCOUNTER — Other Ambulatory Visit: Payer: Self-pay

## 2023-09-28 ENCOUNTER — Ambulatory Visit: Payer: 59 | Admitting: Pharmacist

## 2023-09-28 DIAGNOSIS — B20 Human immunodeficiency virus [HIV] disease: Secondary | ICD-10-CM

## 2023-09-28 MED ORDER — CABOTEGRAVIR & RILPIVIRINE ER 600 & 900 MG/3ML IM SUER
1.0000 | Freq: Once | INTRAMUSCULAR | Status: AC
  Administered 2023-09-28: 1 via INTRAMUSCULAR

## 2023-11-29 ENCOUNTER — Other Ambulatory Visit (HOSPITAL_COMMUNITY): Payer: Self-pay

## 2023-11-30 ENCOUNTER — Other Ambulatory Visit: Payer: Self-pay

## 2023-11-30 ENCOUNTER — Ambulatory Visit (INDEPENDENT_AMBULATORY_CARE_PROVIDER_SITE_OTHER): Payer: 59 | Admitting: Internal Medicine

## 2023-11-30 ENCOUNTER — Encounter: Payer: Self-pay | Admitting: Internal Medicine

## 2023-11-30 VITALS — BP 103/69 | HR 92 | Resp 16 | Ht 68.0 in | Wt 167.0 lb

## 2023-11-30 DIAGNOSIS — B20 Human immunodeficiency virus [HIV] disease: Secondary | ICD-10-CM | POA: Diagnosis not present

## 2023-11-30 MED ORDER — CABOTEGRAVIR & RILPIVIRINE ER 600 & 900 MG/3ML IM SUER
1.0000 | Freq: Once | INTRAMUSCULAR | Status: AC
  Administered 2023-11-30: 1 via INTRAMUSCULAR

## 2023-11-30 NOTE — Progress Notes (Signed)
 RFV: follow up for hiv disease  Patient ID: Julie Fox, female   DOB: Aug 29, 1976, 48 y.o.   MRN: [id number]  HPI Julie Fox is a 47yo F with hiv disease, hx of gastric bypass roux en y in oct 2023. She is here for her cabenuva  injection q 2 months. Doing well overall. She has stayed healthy, without not having flu/or covid. She is also overall doing okay managing her intake and keeping weight stable since her roux en y -- she is under her < 50% of weight/prior to surgery.  Outpatient Encounter Medications as of 11/30/2023  Medication Sig   CABENUVA  600 & 900 MG/3ML injection    omeprazole (PRILOSEC) 40 MG capsule Take 40 mg by mouth daily.   sertraline  (ZOLOFT ) 50 MG tablet Take 1 tablet (50 mg total) by mouth daily.   traZODone  (DESYREL ) 50 MG tablet Take 50 mg by mouth at bedtime. (Patient not taking: Reported on 11/30/2023)   [EXPIRED] cabotegravir  & rilpivirine  ER (CABENUVA ) 600 & 900 MG/3ML injection 1 kit    No facility-administered encounter medications on file as of 11/30/2023.     Patient Active Problem List   Diagnosis Date Noted   Obesity with body mass index 30 or greater 07/27/2022   Easy bruising 07/27/2022   Eruption cyst 07/27/2022   Allergic rhinitis 07/27/2022   Acute otitis media 07/27/2022   Anemia 07/27/2022   Blood coagulation disorder (HCC) 07/27/2022   Cellulitis 07/27/2022   Cough 07/27/2022   Depressive disorder 07/27/2022   Disorder of coccyx 07/27/2022   Dysphasia 07/27/2022   Eustachian tube dysfunction 07/27/2022   Genital herpes simplex 07/27/2022   Goiter 07/27/2022   Insomnia 07/27/2022   Migraine 07/27/2022   Malaise and fatigue 07/27/2022   Lymphadenopathy 07/27/2022   Neck pain 07/27/2022   Numbness 07/27/2022   Abscess of oral tissue 07/27/2022   Cellulitis of oral soft tissues 07/27/2022   Disorder of tongue 07/27/2022   Pharyngitis 07/27/2022   Sinusitis 07/27/2022   Human immunodeficiency virus infection (HCC) 07/27/2022    Anxiety 04/05/2022   Morbid obesity with BMI of 45.0-49.9, adult (HCC) 04/05/2022   HIV infection (HCC) 04/05/2022   High cholesterol 04/05/2022   Pelvic mass in female    Transaminitis 08/18/2020   Vitamin D  deficiency 08/18/2020   Class 3 severe obesity with serious comorbidity and body mass index (BMI) of 40.0 to 44.9 in adult (HCC) 08/18/2020   Acid reflux 08/09/2018   History of adenomatous polyp of colon 07/12/2018   Internal hemorrhoids 07/12/2018   Mild single current episode of major depressive disorder (HCC) 06/20/2018   Post-operative state 01/03/2018   Abnormal uterine bleeding (AUB) 12/12/2017   GAD (generalized anxiety disorder) 06/13/2017   Hyperlipidemia 06/13/2017   HIV disease (HCC) 12/23/2016     Health Maintenance Due  Topic Date Due   Cervical Cancer Screening (HPV/Pap Cotest)  06/13/2022   COVID-19 Vaccine (4 - 2024-25 season) 06/26/2023     Review of Systems 12 point ros is negative Physical Exam   BP 103/69   Pulse 92   Resp 16   Ht 5' 8 (1.727 m)   Wt (S) 167 lb (75.8 kg)   LMP 12/26/2017   BMI 25.39 kg/m   Physical Exam  Constitutional:  oriented to person, place, and time. appears well-developed and well-nourished. No distress.  HENT: [place]/AT, PERRLA, no scleral icterus Mouth/Throat: Oropharynx is clear and moist. No oropharyngeal exudate.  Cardiovascular: Normal rate, regular rhythm and normal heart sounds. Exam  reveals no gallop and no friction rub.  No murmur heard.  Pulmonary/Chest: Effort normal and breath sounds normal. No respiratory distress.  has no wheezes.  Neurological: alert and oriented to person, place, and time.  Skin: Skin is warm and dry. No rash noted. No erythema.  Psychiatric: a normal mood and affect.  behavior is normal.   Lab Results  Component Value Date   CD4TCELL 53 03/28/2023   Lab Results  Component Value Date   CD4TABS 1,613 03/28/2023   CD4TABS 1,375 08/03/2021   CD4TABS 1,731 02/04/2021   Lab Results   Component Value Date   HIV1RNAQUANT Not Detected 07/11/2023   Lab Results  Component Value Date   HEPBSAB REACTIVE (A) 09/02/2021   Lab Results  Component Value Date   LABRPR NON-REACTIVE 12/01/2021    CBC Lab Results  Component Value Date   WBC 6.0 07/11/2023   RBC 4.33 07/11/2023   HGB 12.8 07/11/2023   HCT 39.1 07/11/2023   PLT 274 07/11/2023   MCV 90.3 07/11/2023   MCH 29.6 07/11/2023   MCHC 32.7 07/11/2023   RDW 13.1 07/11/2023   LYMPHSABS 2,958 07/11/2023   MONOABS 0.7 11/06/2017   EOSABS 120 07/11/2023    BMET Lab Results  Component Value Date   NA 140 07/11/2023   K 4.1 07/11/2023   CL 104 07/11/2023   CO2 30 07/11/2023   GLUCOSE 88 07/11/2023   BUN 11 07/11/2023   CREATININE 0.63 07/11/2023   CALCIUM  10.4 (H) 07/11/2023   GFRNONAA 78 02/04/2021   GFRAA 91 02/04/2021      Assessment and Plan  - hiv disease= will check hiv vl and cd 4 count labs and cabenuva  injection today  Long term medication = will check cr  Weight loss = now at her goal weight, 50% of her weight max of 317. Takes vitamin supplements  Health maintenance = uptodate on vaccines and health screeningns

## 2023-12-01 LAB — T-HELPER CELL (CD4) - (RCID CLINIC ONLY)
CD4 % Helper T Cell: 54 % (ref 33–65)
CD4 T Cell Abs: 1083 /uL (ref 400–1790)

## 2023-12-02 LAB — COMPLETE METABOLIC PANEL WITH GFR
AG Ratio: 1.7 (calc) (ref 1.0–2.5)
ALT: 6 U/L (ref 6–29)
AST: 14 U/L (ref 10–35)
Albumin: 4.4 g/dL (ref 3.6–5.1)
Alkaline phosphatase (APISO): 83 U/L (ref 31–125)
BUN: 17 mg/dL (ref 7–25)
CO2: 29 mmol/L (ref 20–32)
Calcium: 10.6 mg/dL — ABNORMAL HIGH (ref 8.6–10.2)
Chloride: 104 mmol/L (ref 98–110)
Creat: 0.75 mg/dL (ref 0.50–0.99)
Globulin: 2.6 g/dL (ref 1.9–3.7)
Glucose, Bld: 79 mg/dL (ref 65–99)
Potassium: 4 mmol/L (ref 3.5–5.3)
Sodium: 140 mmol/L (ref 135–146)
Total Bilirubin: 0.6 mg/dL (ref 0.2–1.2)
Total Protein: 7 g/dL (ref 6.1–8.1)
eGFR: 99 mL/min/{1.73_m2} (ref >=60)

## 2023-12-02 LAB — CBC WITH DIFFERENTIAL/PLATELET
Absolute Lymphocytes: 2056 {cells}/uL (ref 850–3900)
Absolute Monocytes: 260 {cells}/uL (ref 200–950)
Basophils Absolute: 32 {cells}/uL (ref 0–200)
Basophils Relative: 0.6 %
Eosinophils Absolute: 90 {cells}/uL (ref 15–500)
Eosinophils Relative: 1.7 %
HCT: 39.3 % (ref 35.0–45.0)
Hemoglobin: 13.1 g/dL (ref 11.7–15.5)
MCH: 30.3 pg (ref 27.0–33.0)
MCHC: 33.3 g/dL (ref 32.0–36.0)
MCV: 91 fL (ref 80.0–100.0)
MPV: 11.5 fL (ref 7.5–12.5)
Monocytes Relative: 4.9 %
Neutro Abs: 2862 {cells}/uL (ref 1500–7800)
Neutrophils Relative %: 54 %
Platelets: 276 10*3/uL (ref 140–400)
RBC: 4.32 10*6/uL (ref 3.80–5.10)
RDW: 12.6 % (ref 11.0–15.0)
Total Lymphocyte: 38.8 %
WBC: 5.3 10*3/uL (ref 3.8–10.8)

## 2023-12-02 LAB — HIV-1 RNA QUANT-NO REFLEX-BLD
HIV 1 RNA Quant: <20 {copies}/mL — ABNORMAL HIGH
HIV-1 RNA Quant, Log: <1.3 {Log} — ABNORMAL HIGH

## 2023-12-02 LAB — RPR: RPR Ser Ql: NONREACTIVE

## 2023-12-08 ENCOUNTER — Encounter: Payer: Self-pay | Admitting: Pharmacist

## 2024-01-24 NOTE — Progress Notes (Signed)
 HPI: Julie Fox is a 48 y.o. female who presents to the [facility] pharmacy clinic for [place] administration.  Patient Active Problem List   Diagnosis Date Noted   Obesity with body mass index 30 or greater 07/27/2022   Easy bruising 07/27/2022   Eruption cyst 07/27/2022   Allergic rhinitis 07/27/2022   Acute otitis media 07/27/2022   Anemia 07/27/2022   Blood coagulation disorder (HCC) 07/27/2022   Cellulitis 07/27/2022   Cough 07/27/2022   Depressive disorder 07/27/2022   Disorder of coccyx 07/27/2022   Dysphasia 07/27/2022   Eustachian tube dysfunction 07/27/2022   Genital herpes simplex 07/27/2022   Goiter 07/27/2022   Insomnia 07/27/2022   Migraine 07/27/2022   Malaise and fatigue 07/27/2022   Lymphadenopathy 07/27/2022   Neck pain 07/27/2022   Numbness 07/27/2022   Abscess of oral tissue 07/27/2022   Cellulitis of oral soft tissues 07/27/2022   Disorder of tongue 07/27/2022   Pharyngitis 07/27/2022   Sinusitis 07/27/2022   Human immunodeficiency virus infection (HCC) 07/27/2022   Anxiety 04/05/2022   Morbid obesity with BMI of 45.0-49.9, adult (HCC) 04/05/2022   HIV infection (HCC) 04/05/2022   High cholesterol 04/05/2022   Pelvic mass in female    Transaminitis 08/18/2020   Vitamin D deficiency 08/18/2020   Class 3 severe obesity with serious comorbidity and body mass index (BMI) of 40.0 to 44.9 in adult (HCC) 08/18/2020   Acid reflux 08/09/2018   History of adenomatous polyp of colon 07/12/2018   Internal hemorrhoids 07/12/2018   Mild single current episode of major depressive disorder (HCC) 06/20/2018   Post-operative state 01/03/2018   Abnormal uterine bleeding (AUB) 12/12/2017   GAD (generalized anxiety disorder) 06/13/2017   Hyperlipidemia 06/13/2017   HIV disease (HCC) 12/23/2016    Patient's Medications  New Prescriptions   No medications on file  Previous Medications   CABENUVA 600 & 900 MG/3ML INJECTION       OMEPRAZOLE (PRILOSEC) 40 MG  CAPSULE    Take 40 mg by mouth daily.   SERTRALINE (ZOLOFT) 50 MG TABLET    Take 1 tablet (50 mg total) by mouth daily.   TRAZODONE (DESYREL) 50 MG TABLET    Take 50 mg by mouth at bedtime.  Modified Medications   No medications on file  Discontinued Medications   No medications on file    Allergies: Allergies  Allergen Reactions   Penicillins Anaphylaxis    Has patient had a PCN reaction causing immediate rash, facial/tongue/throat swelling, SOB or lightheadedness with hypotension: yes Has patient had a PCN reaction causing severe rash involving mucus membranes or skin necrosis: no Has patient had a PCN reaction that required hospitalization: no Has patient had a PCN reaction occurring within the last 10 years: yes If all of the above answers are "NO", then may proceed with Cephalosporin use.    Nsaids Other (See Comments)   Sulfa Antibiotics Rash    Labs: Lab Results  Component Value Date   HIV1RNAQUANT <20 (H) 11/30/2023   HIV1RNAQUANT Not Detected 07/11/2023   HIV1RNAQUANT Not Detected 03/28/2023   CD4TABS 1,083 11/30/2023   CD4TABS 1,613 03/28/2023   CD4TABS 1,375 08/03/2021    RPR and STI Lab Results  Component Value Date   LABRPR NON-REACTIVE 11/30/2023   LABRPR NON-REACTIVE 12/01/2021   LABRPR NON-REACTIVE 08/03/2021   LABRPR NON-REACTIVE 02/04/2021   LABRPR NON-REACTIVE 07/23/2020    STI Results GC CT  04/02/2022 11:11 AM Negative  Negative   12/27/2018 12:00 AM Negative  Negative  09/27/2017 12:00 AM Negative  Negative   09/27/2016 12:00 AM Negative  Negative     Hepatitis B Lab Results  Component Value Date   HEPBSAB REACTIVE (A) 09/02/2021   HEPBSAG NON-REACTIVE 07/23/2020   HEPBCAB NON REACTIVE 09/27/2016   Hepatitis C Lab Results  Component Value Date   HEPCAB NON-REACTIVE 09/02/2021   Hepatitis A Lab Results  Component Value Date   HAV NON REACTIVE 09/27/2016   Lipids: Lab Results  Component Value Date   CHOL 221 (H) 12/01/2021    TRIG 154 (H) 12/01/2021   HDL 44 (L) 12/01/2021   CHOLHDL 5.0 (H) 12/01/2021   VLDL 26.2 06/20/2018   LDLCALC 148 (H) 12/01/2021    TARGET DATE: The 4th  Assessment: Julie Fox presents today for her maintenance Cabenuva injections. Past injections were tolerated well without issues. Last HIV RNA was <20 in February. Doing well with no issues today.  Administered cabotegravir 600mg /16mL in left upper outer quadrant of the gluteal muscle. Administered rilpivirine 900 mg/3mL in the right upper outer quadrant of the gluteal muscle. No issues with injections. She will follow up in 2 months for next set of injections.  Plan: - Cabenuva injections administered - Next injections scheduled for 03/27/24 with me - Call with any issues or questions  Kalen Neidert L. Karelly Dewalt, PharmD, BCIDP, AAHIVP, CPP Clinical Pharmacist Practitioner - Infectious Diseases Clinical Pharmacist Lead - Specialty Pharmacy [facility] 01/24/2024, 4:19 PM

## 2024-01-25 ENCOUNTER — Other Ambulatory Visit: Payer: Self-pay

## 2024-01-25 ENCOUNTER — Ambulatory Visit: Payer: 59 | Admitting: Pharmacist

## 2024-01-25 DIAGNOSIS — B20 Human immunodeficiency virus [HIV] disease: Secondary | ICD-10-CM | POA: Diagnosis not present

## 2024-01-25 MED ORDER — CABOTEGRAVIR & RILPIVIRINE ER 600 & 900 MG/3ML IM SUER
1.0000 | Freq: Once | INTRAMUSCULAR | Status: AC
  Administered 2024-01-25: 1 via INTRAMUSCULAR

## 2024-01-26 ENCOUNTER — Telehealth: Payer: Self-pay

## 2024-01-26 NOTE — Telephone Encounter (Signed)
 Pharmacy Patient Advocate Encounter- Renaldo Harrison BIV-Medical Benefit:  J code: [id number]  CPT code: 60454  Dx Code: B20  NO PA is required through [facility] Portal . Authorization# [id number]

## 2024-03-07 NOTE — Progress Notes (Signed)
 The 10-year ASCVD risk score (Arnett DK, et al., 2019) is: 0.8%   Values used to calculate the score:     Age: 48 years     Sex: Female     Is Non-Hispanic African American: No     Diabetic: No     Tobacco smoker: No     Systolic Blood Pressure: 103 mmHg     Is BP treated: No     HDL Cholesterol: 45 mg/dL     Total Cholesterol: 198 mg/dL  Arlon Bergamo, BSN, RN

## 2024-03-26 NOTE — Progress Notes (Signed)
 HPI: ISABELL BONAFEDE is a 48 y.o. female who presents to the [facility] pharmacy clinic for Cabenuva  administration.  Patient Active Problem List   Diagnosis Date Noted   Obesity with body mass index 30 or greater 07/27/2022   Easy bruising 07/27/2022   Eruption cyst 07/27/2022   Allergic rhinitis 07/27/2022   Acute otitis media 07/27/2022   Anemia 07/27/2022   Blood coagulation disorder (HCC) 07/27/2022   Cellulitis 07/27/2022   Cough 07/27/2022   Depressive disorder 07/27/2022   Disorder of coccyx 07/27/2022   Dysphasia 07/27/2022   Eustachian tube dysfunction 07/27/2022   Genital herpes simplex 07/27/2022   Goiter 07/27/2022   Insomnia 07/27/2022   Migraine 07/27/2022   Malaise and fatigue 07/27/2022   Lymphadenopathy 07/27/2022   Neck pain 07/27/2022   Numbness 07/27/2022   Abscess of oral tissue 07/27/2022   Cellulitis of oral soft tissues 07/27/2022   Disorder of tongue 07/27/2022   Pharyngitis 07/27/2022   Sinusitis 07/27/2022   Human immunodeficiency virus infection (HCC) 07/27/2022   Anxiety 04/05/2022   Morbid obesity with BMI of 45.0-49.9, adult (HCC) 04/05/2022   HIV infection (HCC) 04/05/2022   High cholesterol 04/05/2022   Pelvic mass in female    Transaminitis 08/18/2020   Vitamin D  deficiency 08/18/2020   Class 3 severe obesity with serious comorbidity and body mass index (BMI) of 40.0 to 44.9 in adult 08/18/2020   Acid reflux 08/09/2018   History of adenomatous polyp of colon 07/12/2018   Internal hemorrhoids 07/12/2018   Mild single current episode of major depressive disorder (HCC) 06/20/2018   Post-operative state 01/03/2018   Abnormal uterine bleeding (AUB) 12/12/2017   GAD (generalized anxiety disorder) 06/13/2017   Hyperlipidemia 06/13/2017   HIV disease (HCC) 12/23/2016    Patient's Medications  New Prescriptions   No medications on file  Previous Medications   CABENUVA  600 & 900 MG/3ML INJECTION       OMEPRAZOLE (PRILOSEC) 40 MG  CAPSULE    Take 40 mg by mouth daily.   SERTRALINE  (ZOLOFT ) 50 MG TABLET    Take 1 tablet (50 mg total) by mouth daily.   TRAZODONE  (DESYREL ) 50 MG TABLET    Take 50 mg by mouth at bedtime.  Modified Medications   No medications on file  Discontinued Medications   No medications on file    Allergies: Allergies  Allergen Reactions   Penicillins Anaphylaxis    Has patient had a PCN reaction causing immediate rash, facial/tongue/throat swelling, SOB or lightheadedness with hypotension: yes Has patient had a PCN reaction causing severe rash involving mucus membranes or skin necrosis: no Has patient had a PCN reaction that required hospitalization: no Has patient had a PCN reaction occurring within the last 10 years: yes If all of the above answers are "NO", then may proceed with Cephalosporin use.    Nsaids Other (See Comments)   Sulfa Antibiotics Rash    Labs: Lab Results  Component Value Date   HIV1RNAQUANT <20 (H) 11/30/2023   HIV1RNAQUANT Not Detected 07/11/2023   HIV1RNAQUANT Not Detected 03/28/2023   CD4TABS 1,083 11/30/2023   CD4TABS 1,613 03/28/2023   CD4TABS 1,375 08/03/2021    RPR and STI Lab Results  Component Value Date   LABRPR NON-REACTIVE 11/30/2023   LABRPR NON-REACTIVE 12/01/2021   LABRPR NON-REACTIVE 08/03/2021   LABRPR NON-REACTIVE 02/04/2021   LABRPR NON-REACTIVE 07/23/2020    STI Results GC CT  04/02/2022 11:11 AM Negative  Negative   12/27/2018 12:00 AM Negative  Negative  09/27/2017 12:00 AM Negative  Negative   09/27/2016 12:00 AM Negative  Negative     Hepatitis B Lab Results  Component Value Date   HEPBSAB REACTIVE (A) 09/02/2021   HEPBSAG NON-REACTIVE 07/23/2020   HEPBCAB NON REACTIVE 09/27/2016   Hepatitis C Lab Results  Component Value Date   HEPCAB NON-REACTIVE 09/02/2021   Hepatitis A Lab Results  Component Value Date   HAV NON REACTIVE 09/27/2016   Lipids: Lab Results  Component Value Date   CHOL 221 (H) 12/01/2021    TRIG 154 (H) 12/01/2021   HDL 44 (L) 12/01/2021   CHOLHDL 5.0 (H) 12/01/2021   VLDL 26.2 06/20/2018   LDLCALC 148 (H) 12/01/2021    TARGET DATE: The 4th  Assessment: Kyiah presents today for her maintenance Cabenuva  injections. Past injections were tolerated well without issues. Last HIV RNA was undetectable in February. Doing well with no issues today.  Administered cabotegravir  600mg /20mL in left upper outer quadrant of the gluteal muscle. Administered rilpivirine  900 mg/3mL in the right upper outer quadrant of the gluteal muscle. No issues with injections. She will follow up in 2 months for next set of injections.  Plan: - Cabenuva  injections administered - Next injections scheduled for 05/25/24 with Dr. Levern Reader and 101/25 with me - Call with any issues or questions  Eyal Greenhaw L. Weltha Cathy, PharmD, BCIDP, AAHIVP, CPP Clinical Pharmacist Practitioner - Infectious Diseases Clinical Pharmacist Lead - Specialty Pharmacy [facility] for Infectious Disease 03/26/2024, 4:25 PM

## 2024-03-27 ENCOUNTER — Ambulatory Visit: Admitting: Pharmacist

## 2024-03-27 ENCOUNTER — Other Ambulatory Visit: Payer: Self-pay

## 2024-03-27 DIAGNOSIS — B20 Human immunodeficiency virus [HIV] disease: Secondary | ICD-10-CM | POA: Diagnosis not present

## 2024-03-27 MED ORDER — CABOTEGRAVIR & RILPIVIRINE ER 600 & 900 MG/3ML IM SUER
1.0000 | Freq: Once | INTRAMUSCULAR | Status: AC
  Administered 2024-03-27: 1 via INTRAMUSCULAR

## 2024-04-25 ENCOUNTER — Encounter: Payer: Self-pay | Admitting: Pharmacist

## 2024-05-25 ENCOUNTER — Other Ambulatory Visit: Payer: Self-pay

## 2024-05-25 ENCOUNTER — Ambulatory Visit (INDEPENDENT_AMBULATORY_CARE_PROVIDER_SITE_OTHER): Admitting: Internal Medicine

## 2024-05-25 ENCOUNTER — Encounter: Payer: Self-pay | Admitting: Internal Medicine

## 2024-05-25 VITALS — BP 113/76 | HR 102 | Temp 97.5°F | Ht 68.0 in | Wt 171.0 lb

## 2024-05-25 DIAGNOSIS — I951 Orthostatic hypotension: Secondary | ICD-10-CM

## 2024-05-25 DIAGNOSIS — B20 Human immunodeficiency virus [HIV] disease: Secondary | ICD-10-CM

## 2024-05-25 DIAGNOSIS — Z79899 Other long term (current) drug therapy: Secondary | ICD-10-CM

## 2024-05-25 MED ORDER — CABOTEGRAVIR & RILPIVIRINE ER 600 & 900 MG/3ML IM SUER
1.0000 | Freq: Once | INTRAMUSCULAR | Status: AC
  Administered 2024-05-25: 1 via INTRAMUSCULAR

## 2024-05-25 NOTE — Progress Notes (Signed)
 RFV: for hiv disease  Patient ID: Julie Fox, female   DOB: 1975-11-08, 48 y.o.   MRN: [id number]  HPI Julie Fox is a 48yo F with hiv disease,  receiving cabenuva  Q2 months. Since we last saw her, she was treated for sinus infection - 5 days, she is also noticing having some  Symptomatic hypotension - 30 secs per morning  Has upcoming trip to [place] with 2 girlfriends.   Outpatient Encounter Medications as of 05/25/2024  Medication Sig   CABENUVA  600 & 900 MG/3ML injection    omeprazole (PRILOSEC) 40 MG capsule Take 40 mg by mouth daily.   sertraline  (ZOLOFT ) 50 MG tablet Take 1 tablet (50 mg total) by mouth daily.   traZODone  (DESYREL ) 50 MG tablet Take 50 mg by mouth at bedtime. (Patient not taking: Reported on 11/30/2023)   No facility-administered encounter medications on file as of 05/25/2024.     Patient Active Problem List   Diagnosis Date Noted   Obesity with body mass index 30 or greater 07/27/2022   Easy bruising 07/27/2022   Eruption cyst 07/27/2022   Allergic rhinitis 07/27/2022   Acute otitis media 07/27/2022   Anemia 07/27/2022   Blood coagulation disorder (HCC) 07/27/2022   Cellulitis 07/27/2022   Cough 07/27/2022   Depressive disorder 07/27/2022   Disorder of coccyx 07/27/2022   Dysphasia 07/27/2022   Eustachian tube dysfunction 07/27/2022   Genital herpes simplex 07/27/2022   Goiter 07/27/2022   Insomnia 07/27/2022   Migraine 07/27/2022   Malaise and fatigue 07/27/2022   Lymphadenopathy 07/27/2022   Neck pain 07/27/2022   Numbness 07/27/2022   Abscess of oral tissue 07/27/2022   Cellulitis of oral soft tissues 07/27/2022   Disorder of tongue 07/27/2022   Pharyngitis 07/27/2022   Sinusitis 07/27/2022   Human immunodeficiency virus infection (HCC) 07/27/2022   Anxiety 04/05/2022   Morbid obesity with BMI of 45.0-49.9, adult (HCC) 04/05/2022   HIV infection (HCC) 04/05/2022   High cholesterol 04/05/2022   Pelvic mass in female    Transaminitis  08/18/2020   Vitamin D  deficiency 08/18/2020   Class 3 severe obesity with serious comorbidity and body mass index (BMI) of 40.0 to 44.9 in adult 08/18/2020   Acid reflux 08/09/2018   History of adenomatous polyp of colon 07/12/2018   Internal hemorrhoids 07/12/2018   Mild single current episode of major depressive disorder (HCC) 06/20/2018   Post-operative state 01/03/2018   Abnormal uterine bleeding (AUB) 12/12/2017   GAD (generalized anxiety disorder) 06/13/2017   Hyperlipidemia 06/13/2017   HIV disease (HCC) 12/23/2016     Health Maintenance Due  Topic Date Due   Hepatitis B Vaccines (1 of 3 - 19+ 3-dose series) Never done   Cervical Cancer Screening (HPV/Pap Cotest)  06/13/2022   COVID-19 Vaccine (4 - 2024-25 season) 06/26/2023   INFLUENZA VACCINE  05/25/2024     Review of Systems 12 point ros is negative except what was mentioned above Physical Exam   BP 113/76   Pulse (!) 102   Temp (!) 97.5 F (36.4 C) (Temporal)   Ht 5' 8 (1.727 m)   Wt 171 lb (77.6 kg)   LMP 12/26/2017   SpO2 95%   BMI 26.00 kg/m   Physical Exam  Constitutional:  oriented to person, place, and time. appears well-developed and well-nourished. No distress.  HENT: [place]/AT, PERRLA, no scleral icterus Mouth/Throat: Oropharynx is clear and moist. No oropharyngeal exudate.  Cardiovascular: Normal rate, regular rhythm and normal heart sounds. Exam reveals no gallop and  no friction rub.  No murmur heard.  Pulmonary/Chest: Effort normal and breath sounds normal. No respiratory distress.  has no wheezes.  Neck = supple, no nuchal rigidity Abdominal: Soft. Bowel sounds are normal.  exhibits no distension. There is no tenderness.  Lymphadenopathy: no cervical adenopathy. No axillary adenopathy Neurological: alert and oriented to person, place, and time.  Skin: Skin is warm and dry. No rash noted. No erythema.  Psychiatric: a normal mood and affect.  behavior is normal.   Lab Results  Component Value  Date   CD4TCELL 54 11/30/2023   Lab Results  Component Value Date   CD4TABS 1,083 11/30/2023   CD4TABS 1,613 03/28/2023   CD4TABS 1,375 08/03/2021   Lab Results  Component Value Date   HIV1RNAQUANT <20 (H) 11/30/2023   Lab Results  Component Value Date   HEPBSAB REACTIVE (A) 09/02/2021   Lab Results  Component Value Date   LABRPR NON-REACTIVE 11/30/2023    CBC Lab Results  Component Value Date   WBC 5.3 11/30/2023   RBC 4.32 11/30/2023   HGB 13.1 11/30/2023   HCT 39.3 11/30/2023   PLT 276 11/30/2023   MCV 91.0 11/30/2023   MCH 30.3 11/30/2023   MCHC 33.3 11/30/2023   RDW 12.6 11/30/2023   LYMPHSABS 2,958 07/11/2023   MONOABS 0.7 11/06/2017   EOSABS 90 11/30/2023    BMET Lab Results  Component Value Date   NA 140 11/30/2023   K 4.0 11/30/2023   CL 104 11/30/2023   CO2 29 11/30/2023   GLUCOSE 79 11/30/2023   BUN 17 11/30/2023   CREATININE 0.75 11/30/2023   CALCIUM  10.6 (H) 11/30/2023   GFRNONAA 78 02/04/2021   GFRAA 91 02/04/2021      Assessment and Plan  HIV disease = plan to get cabenuva  injection today. In addition, will check CD 4 count and HIV VL.   Long term medication management = will check cmp and cbc to see that she is still at baseline  Hypotension = may have postural hypotension by description. Recommending waiting extra time between positions. Keeping hydrated. Recommend compression socks during work day. Question if this maybe related to her recent weight loss.

## 2024-05-30 LAB — CBC WITH DIFFERENTIAL/PLATELET
Absolute Lymphocytes: 2304 {cells}/uL (ref 850–3900)
Absolute Monocytes: 317 {cells}/uL (ref 200–950)
Basophils Absolute: 42 {cells}/uL (ref 0–200)
Basophils Relative: 0.8 %
Eosinophils Absolute: 68 {cells}/uL (ref 15–500)
Eosinophils Relative: 1.3 %
HCT: 39.6 % (ref 35.0–45.0)
Hemoglobin: 12.7 g/dL (ref 11.7–15.5)
MCH: 29.8 pg (ref 27.0–33.0)
MCHC: 32.1 g/dL (ref 32.0–36.0)
MCV: 93 fL (ref 80.0–100.0)
MPV: 11.2 fL (ref 7.5–12.5)
Monocytes Relative: 6.1 %
Neutro Abs: 2470 {cells}/uL (ref 1500–7800)
Neutrophils Relative %: 47.5 %
Platelets: 245 Thousand/uL (ref 140–400)
RBC: 4.26 Million/uL (ref 3.80–5.10)
RDW: 12.5 % (ref 11.0–15.0)
Total Lymphocyte: 44.3 %
WBC: 5.2 Thousand/uL (ref 3.8–10.8)

## 2024-05-30 LAB — COMPLETE METABOLIC PANEL WITHOUT GFR
AG Ratio: 1.8 (calc) (ref 1.0–2.5)
ALT: 10 U/L (ref 6–29)
AST: 14 U/L (ref 10–35)
Albumin: 4.5 g/dL (ref 3.6–5.1)
Alkaline phosphatase (APISO): 74 U/L (ref 31–125)
BUN: 16 mg/dL (ref 7–25)
CO2: 29 mmol/L (ref 20–32)
Calcium: 10.6 mg/dL — ABNORMAL HIGH (ref 8.6–10.2)
Chloride: 102 mmol/L (ref 98–110)
Creat: 0.84 mg/dL (ref 0.50–0.99)
Globulin: 2.5 g/dL (ref 1.9–3.7)
Glucose, Bld: 79 mg/dL (ref 65–99)
Potassium: 4.2 mmol/L (ref 3.5–5.3)
Sodium: 139 mmol/L (ref 135–146)
Total Bilirubin: 0.7 mg/dL (ref 0.2–1.2)
Total Protein: 7 g/dL (ref 6.1–8.1)

## 2024-05-30 LAB — HIV-1 RNA QUANT-NO REFLEX-BLD
HIV 1 RNA Quant: NOT DETECTED {copies}/mL
HIV-1 RNA Quant, Log: NOT DETECTED {Log_copies}/mL

## 2024-05-30 LAB — T-HELPER CELLS (CD4) COUNT (NOT AT ARMC)
Absolute CD4: 1278 {cells}/uL (ref 490–1740)
CD4 T Helper %: 55 % (ref 30–61)
Total lymphocyte count: 2320 {cells}/uL (ref 850–3900)

## 2024-05-31 ENCOUNTER — Encounter: Payer: Self-pay | Admitting: Pharmacist

## 2024-06-01 ENCOUNTER — Encounter: Payer: Self-pay | Admitting: Internal Medicine

## 2024-07-25 ENCOUNTER — Ambulatory Visit: Payer: Self-pay | Admitting: Pharmacist

## 2024-07-30 ENCOUNTER — Ambulatory Visit (INDEPENDENT_AMBULATORY_CARE_PROVIDER_SITE_OTHER): Admitting: Family

## 2024-07-30 ENCOUNTER — Ambulatory Visit: Admitting: Pharmacist

## 2024-07-30 ENCOUNTER — Other Ambulatory Visit: Payer: Self-pay

## 2024-07-30 ENCOUNTER — Encounter: Payer: Self-pay | Admitting: Family

## 2024-07-30 VITALS — BP 133/74 | HR 69 | Temp 98.5°F | Wt 183.0 lb

## 2024-07-30 DIAGNOSIS — Z23 Encounter for immunization: Secondary | ICD-10-CM | POA: Diagnosis not present

## 2024-07-30 DIAGNOSIS — Z0000 Encounter for general adult medical examination without abnormal findings: Secondary | ICD-10-CM | POA: Diagnosis not present

## 2024-07-30 DIAGNOSIS — B20 Human immunodeficiency virus [HIV] disease: Secondary | ICD-10-CM | POA: Diagnosis not present

## 2024-07-30 MED ORDER — CABOTEGRAVIR & RILPIVIRINE ER 600 & 900 MG/3ML IM SUER
1.0000 | Freq: Once | INTRAMUSCULAR | Status: AC
  Administered 2024-07-30: 1 via INTRAMUSCULAR

## 2024-07-30 NOTE — Assessment & Plan Note (Signed)
 Julie Fox continues to have well-controlled virus with good adherence and tolerance to Cabenuva .  Reviewed lab work and discussed plan of care and U equals U.  No problems obtaining medication covered by [place] [facility].  Social determinants of health reviewed with no interventions indicated.  Q 2 month injection of Cabenuva  provided without complication. Plan for follow up in 4 months with Dr. Luiz and pharmacy provider in between.

## 2024-07-30 NOTE — Progress Notes (Signed)
 Patient ID: Julie Fox, female    DOB: 11/11/1975, 48 y.o.   MRN: [id number]  Subjective:   Chief Complaint  Patient presents with   Follow-up    Flu vaccine    HPI:  Julie Fox is a 48 y.o. female with HIV disease last seen on 05/25/2024 by Dr. Luiz with well-controlled virus and good adherence and tolerance to Cabenuva .  Viral load was undetectable with CD4 count 1278.  Kidney function, liver function, electrolytes within normal ranges. Target date is the 4th of the month. Here today for next Cabenuva  injection.  Julie Fox has been doing well since her last office visit and continues to receive Cabenuva  with no adverse side effects. Has questions about establishing with new primary care and has had an outbreak of HSV on her buttock that went away on it its own. This is the second occurrence. Healthcare maintenance reviewed.  Denies fevers, chills, night sweats, headaches, changes in vision, neck pain/stiffness, nausea, diarrhea, vomiting, lesions or rashes.  Lab Results  Component Value Date   CD4TCELL 55 05/25/2024   CD4TABS 1,083 11/30/2023   Lab Results  Component Value Date   HIV1RNAQUANT NOT DETECTED 05/25/2024     Allergies  Allergen Reactions   Penicillins Anaphylaxis    Has patient had a PCN reaction causing immediate rash, facial/tongue/throat swelling, SOB or lightheadedness with hypotension: yes Has patient had a PCN reaction causing severe rash involving mucus membranes or skin necrosis: no Has patient had a PCN reaction that required hospitalization: no Has patient had a PCN reaction occurring within the last 10 years: yes If all of the above answers are NO, then may proceed with Cephalosporin use.    Nsaids Other (See Comments)   Sulfa Antibiotics Rash      Outpatient Medications Prior to Visit  Medication Sig Dispense Refill   CABENUVA  600 & 900 MG/3ML injection      omeprazole (PRILOSEC) 40 MG capsule Take 40 mg by mouth daily.      sertraline  (ZOLOFT ) 50 MG tablet Take 1 tablet (50 mg total) by mouth daily. 30 tablet 11   traZODone  (DESYREL ) 50 MG tablet Take 50 mg by mouth at bedtime. (Patient not taking: Reported on 11/30/2023)     No facility-administered medications prior to visit.     Past Medical History:  Diagnosis Date   Allergy    Anxiety    Asthma    Chronic kidney disease    Depression    GAD (generalized anxiety disorder)    Gallbladder problem    GERD (gastroesophageal reflux disease)    no meds, diet controlled   Heartburn    High cholesterol    History of colon polyps    History of epilepsy    childhood seizures - last one age 56 yr, non since   HIV (human immunodeficiency virus infection) (HCC)    Hx of abnormal cervical Pap smear    2012 per pt, then normal since    Migraine    otc med prn   Obesity    Pneumonia    Pure hypercholesterolemia    Seasonal allergies    Seizures (HCC)    Juvenile epilepsy   Sleep apnea    Vitamin B12 deficiency    Vitamin D  deficiency      Past Surgical History:  Procedure Laterality Date   ABDOMINAL HYSTERECTOMY  2019 & 2021   CHOLECYSTECTOMY     COLONOSCOPY     ROBOTIC ASSISTED SALPINGO OOPHERECTOMY N/A  10/02/2020   Procedure: XI ROBOTIC ASSISTED  BILATERAL SALPING-OOPHORECTOMY, LIVER BX;  Surgeon: Viktoria Comer SAUNDERS, MD;  Location: [facility];  Service: Gynecology;  Laterality: N/A;   TOE SURGERY     age 72-cyst excision per patient   TONSILLECTOMY  2016   UPPER GI ENDOSCOPY     VAGINAL HYSTERECTOMY Bilateral 01/03/2018   Procedure: HYSTERECTOMY VAGINAL WITH SALPINGECTOMY;  Surgeon: Corene Coy, MD;  Location: [facility];  Service: Gynecology;  Laterality: Bilateral;   WISDOM TOOTH EXTRACTION     x 2 lower        Review of Systems  Constitutional:  Negative for appetite change, chills, diaphoresis, fatigue, fever and unexpected weight change.  Eyes:        Negative for acute change in vision  Respiratory:  Negative for chest  tightness, shortness of breath and wheezing.   Cardiovascular:  Negative for chest pain.  Gastrointestinal:  Negative for diarrhea, nausea and vomiting.  Genitourinary:  Negative for dysuria, pelvic pain and vaginal discharge.  Musculoskeletal:  Negative for neck pain and neck stiffness.  Skin:  Negative for rash.  Neurological:  Negative for seizures, syncope, weakness and headaches.  Hematological:  Negative for adenopathy. Does not bruise/bleed easily.  Psychiatric/Behavioral:  Negative for hallucinations.      Objective:   BP 133/74   Pulse 69   Temp 98.5 F (36.9 C) (Oral)   Wt 183 lb (83 kg)   LMP 12/26/2017   SpO2 93%   BMI 27.83 kg/m  Nursing note and vital signs reviewed.  Physical Exam Constitutional:      General: She is not in acute distress.    Appearance: She is well-developed.  Eyes:     Conjunctiva/sclera: Conjunctivae normal.  Cardiovascular:     Rate and Rhythm: Normal rate and regular rhythm.     Heart sounds: Normal heart sounds. No murmur heard.    No friction rub. No gallop.  Pulmonary:     Effort: Pulmonary effort is normal. No respiratory distress.     Breath sounds: Normal breath sounds. No wheezing or rales.  Chest:     Chest wall: No tenderness.  Abdominal:     General: Bowel sounds are normal.     Palpations: Abdomen is soft.     Tenderness: There is no abdominal tenderness.  Musculoskeletal:     Cervical back: Neck supple.  Lymphadenopathy:     Cervical: No cervical adenopathy.  Skin:    General: Skin is warm and dry.     Findings: No rash.  Neurological:     Mental Status: She is alert and oriented to person, place, and time.          07/30/2024    2:27 PM 05/25/2024   10:47 AM 11/30/2023   11:15 AM 08/17/2023    1:15 PM 07/11/2023   10:59 AM  Depression screen PHQ 2/9  Decreased Interest 0 0 0 0 0  Down, Depressed, Hopeless 0 0 0 1 0  PHQ - 2 Score 0 0 0 1 0  Altered sleeping 3 0  1   Tired, decreased energy 0 0  1    Change in appetite 0 0  0   Feeling bad or failure about yourself  0 0  0   Trouble concentrating 2 0  2   Moving slowly or fidgety/restless 0 0  0   Suicidal thoughts 0 0  0   PHQ-9 Score 5 0  5   Difficult doing work/chores Not  difficult at all Not difficult at all           07/30/2024    2:27 PM 05/25/2024   10:47 AM 08/17/2023    1:15 PM 10/14/2021    2:36 PM  GAD 7 : Generalized Anxiety Score  Nervous, Anxious, on Edge 3 0 3 3  Control/stop worrying 3 0 1 0  Worry too much - different things 3 0 2 0  Trouble relaxing 3 0 2 0  Restless 0 0 1 0  Easily annoyed or irritable 2 0 2 3  Afraid - awful might happen 0 0 0 0  Total GAD 7 Score 14 0 11 6  Anxiety Difficulty Not difficult at all Not difficult at all  Not difficult at all     The 10-year ASCVD risk score (Arnett DK, et al., 2019) is: 0.9%   Values used to calculate the score:     Age: 28 years     Clincally relevant sex: Female     Is Non-Hispanic African American: No     Diabetic: No     Tobacco smoker: No     Systolic Blood Pressure: 133 mmHg     Is BP treated: No     HDL Cholesterol: 59 mg/dL     Total Cholesterol: 194 mg/dL      Assessment & Plan:    Patient Active Problem List   Diagnosis Date Noted   Healthcare maintenance 07/30/2024   Obesity with body mass index 30 or greater 07/27/2022   Easy bruising 07/27/2022   Eruption cyst 07/27/2022   Allergic rhinitis 07/27/2022   Acute otitis media 07/27/2022   Anemia 07/27/2022   Blood coagulation disorder 07/27/2022   Cellulitis 07/27/2022   Cough 07/27/2022   Depressive disorder 07/27/2022   Disorder of coccyx 07/27/2022   Dysphasia 07/27/2022   Eustachian tube dysfunction 07/27/2022   Genital herpes simplex 07/27/2022   Goiter 07/27/2022   Insomnia 07/27/2022   Migraine 07/27/2022   Malaise and fatigue 07/27/2022   Lymphadenopathy 07/27/2022   Neck pain 07/27/2022   Numbness 07/27/2022   Abscess of oral tissue 07/27/2022   Cellulitis of  oral soft tissues 07/27/2022   Disorder of tongue 07/27/2022   Pharyngitis 07/27/2022   Sinusitis 07/27/2022   Human immunodeficiency virus infection (HCC) 07/27/2022   Anxiety 04/05/2022   Morbid obesity with BMI of 45.0-49.9, adult (HCC) 04/05/2022   HIV infection (HCC) 04/05/2022   High cholesterol 04/05/2022   Pelvic mass in female    Transaminitis 08/18/2020   Vitamin D  deficiency 08/18/2020   Class 3 severe obesity with serious comorbidity and body mass index (BMI) of 40.0 to 44.9 in adult (HCC) 08/18/2020   Acid reflux 08/09/2018   History of adenomatous polyp of colon 07/12/2018   Internal hemorrhoids 07/12/2018   Mild single current episode of major depressive disorder 06/20/2018   Post-operative state 01/03/2018   Abnormal uterine bleeding (AUB) 12/12/2017   GAD (generalized anxiety disorder) 06/13/2017   Hyperlipidemia 06/13/2017   HIV disease (HCC) 12/23/2016     Problem List Items Addressed This Visit       Other   HIV disease (HCC) - Primary   Julie Fox continues to have well-controlled virus with good adherence and tolerance to Cabenuva .  Reviewed lab work and discussed plan of care and U equals U.  No problems obtaining medication covered by [place] [facility].  Social determinants of health reviewed with no interventions indicated.  Q 2 month injection of Cabenuva  provided  without complication. Plan for follow up in 4 months with Dr. Luiz and pharmacy provider in between.       Healthcare maintenance   Dental care up to date. Total hysterectomy and follows with GYN annually.  Vaccinations reviewed and influenza updated.       Other Visit Diagnoses       Need for influenza vaccination       Relevant Orders   Flu vaccine trivalent PF, 6mos and older(Flulaval,Afluria,Fluarix,Fluzone) (Completed)        I have discontinued Stela L. Crosson's omeprazole, sertraline , and traZODone . I am also having her maintain her Cabenuva . We administered  cabotegravir  & rilpivirine  ER.   Meds ordered this encounter  Medications   cabotegravir  & rilpivirine  ER (CABENUVA ) 600 & 900 MG/3ML injection 1 kit     Follow-up: Return in about 2 months (around 09/29/2024). or sooner if needed.    Cathlyn July, MSN, FNP-C Nurse Practitioner [facility] for Infectious Disease [facility] Main number: [phone]

## 2024-07-30 NOTE — Patient Instructions (Signed)
 Nice to see you.  For primary care recommend [place] offices.   Plan for follow up in 2 months or sooner if needed.  Have a great day and stay safe!

## 2024-07-30 NOTE — Assessment & Plan Note (Signed)
 Dental care up to date. Total hysterectomy and follows with GYN annually.  Vaccinations reviewed and influenza updated.

## 2024-08-17 ENCOUNTER — Other Ambulatory Visit (HOSPITAL_COMMUNITY): Payer: Self-pay

## 2024-08-27 ENCOUNTER — Other Ambulatory Visit: Payer: Self-pay | Admitting: Obstetrics & Gynecology

## 2024-08-27 DIAGNOSIS — Z1231 Encounter for screening mammogram for malignant neoplasm of breast: Secondary | ICD-10-CM

## 2024-09-25 ENCOUNTER — Encounter

## 2024-09-25 DIAGNOSIS — Z1231 Encounter for screening mammogram for malignant neoplasm of breast: Secondary | ICD-10-CM

## 2024-09-25 NOTE — Progress Notes (Deleted)
 HPI: Julie Fox is a 48 y.o. female who presents to the [facility] pharmacy clinic for Cabenuva  administration.  Referring ID Provider: ***  Patient Active Problem List   Diagnosis Date Noted   Healthcare maintenance 07/30/2024   Obesity with body mass index 30 or greater 07/27/2022   Easy bruising 07/27/2022   Eruption cyst 07/27/2022   Allergic rhinitis 07/27/2022   Acute otitis media 07/27/2022   Anemia 07/27/2022   Blood coagulation disorder 07/27/2022   Cellulitis 07/27/2022   Cough 07/27/2022   Depressive disorder 07/27/2022   Disorder of coccyx 07/27/2022   Dysphasia 07/27/2022   Eustachian tube dysfunction 07/27/2022   Genital herpes simplex 07/27/2022   Goiter 07/27/2022   Insomnia 07/27/2022   Migraine 07/27/2022   Malaise and fatigue 07/27/2022   Lymphadenopathy 07/27/2022   Neck pain 07/27/2022   Numbness 07/27/2022   Abscess of oral tissue 07/27/2022   Cellulitis of oral soft tissues 07/27/2022   Disorder of tongue 07/27/2022   Pharyngitis 07/27/2022   Sinusitis 07/27/2022   Human immunodeficiency virus infection (HCC) 07/27/2022   Anxiety 04/05/2022   Morbid obesity with BMI of 45.0-49.9, adult (HCC) 04/05/2022   HIV infection (HCC) 04/05/2022   High cholesterol 04/05/2022   Pelvic mass in female    Transaminitis 08/18/2020   Vitamin D  deficiency 08/18/2020   Class 3 severe obesity with serious comorbidity and body mass index (BMI) of 40.0 to 44.9 in adult [facility]) 08/18/2020   Acid reflux 08/09/2018   History of adenomatous polyp of colon 07/12/2018   Internal hemorrhoids 07/12/2018   Mild single current episode of major depressive disorder 06/20/2018   Post-operative state 01/03/2018   Abnormal uterine bleeding (AUB) 12/12/2017   GAD (generalized anxiety disorder) 06/13/2017   Hyperlipidemia 06/13/2017   HIV disease (HCC) 12/23/2016    Patient's Medications  New Prescriptions   No medications on file  Previous Medications   CABENUVA  600 &  900 MG/3ML INJECTION      Modified Medications   No medications on file  Discontinued Medications   No medications on file    Allergies: Allergies  Allergen Reactions   Penicillins Anaphylaxis    Has patient had a PCN reaction causing immediate rash, facial/tongue/throat swelling, SOB or lightheadedness with hypotension: yes Has patient had a PCN reaction causing severe rash involving mucus membranes or skin necrosis: no Has patient had a PCN reaction that required hospitalization: no Has patient had a PCN reaction occurring within the last 10 years: yes If all of the above answers are NO, then may proceed with Cephalosporin use.    Nsaids Other (See Comments)   Sulfa Antibiotics Rash    Labs: Lab Results  Component Value Date   HIV1RNAQUANT NOT DETECTED 05/25/2024   HIV1RNAQUANT <20 (H) 11/30/2023   HIV1RNAQUANT Not Detected 07/11/2023   CD4TABS 1,083 11/30/2023   CD4TABS 1,613 03/28/2023   CD4TABS 1,375 08/03/2021    RPR and STI Lab Results  Component Value Date   LABRPR NON-REACTIVE 11/30/2023   LABRPR NON-REACTIVE 12/01/2021   LABRPR NON-REACTIVE 08/03/2021   LABRPR NON-REACTIVE 02/04/2021   LABRPR NON-REACTIVE 07/23/2020    STI Results GC CT  04/02/2022 11:11 AM Negative  Negative   12/27/2018 12:00 AM Negative  Negative   09/27/2017 12:00 AM Negative  Negative   09/27/2016 12:00 AM Negative  Negative     Hepatitis B Lab Results  Component Value Date   HEPBSAB REACTIVE (A) 09/02/2021   HEPBSAG NON-REACTIVE 07/23/2020   HEPBCAB NON REACTIVE  09/27/2016   Hepatitis C Lab Results  Component Value Date   HEPCAB NON-REACTIVE 09/02/2021   Hepatitis A Lab Results  Component Value Date   HAV NON REACTIVE 09/27/2016   Lipids: Lab Results  Component Value Date   CHOL 221 (H) 12/01/2021   TRIG 154 (H) 12/01/2021   HDL 44 (L) 12/01/2021   CHOLHDL 5.0 (H) 12/01/2021   VLDL 26.2 06/20/2018   LDLCALC 148 (H) 12/01/2021    Target Date: The  4th  Assessment: Julie Fox presents today for her maintenance Cabenuva  injections. Past injections were tolerated well without issues. Last HIV RNA was not detected in August. Doing well with no issues today.  Lab work:  None today  Eligible vaccinations:  Currently up to date on all recommended vaccines.   Cabenuva : Administered cabotegravir  600mg /59mL in left upper outer quadrant of the gluteal muscle. Administered rilpivirine  900 mg/3mL in the right upper outer quadrant of the gluteal muscle. No issues with injections. She will follow up in 2 months for next set of injections.  Plan: - Cabenuva  injections administered - Next injections scheduled for *** - Call with any issues or questions  Mykel Mohl L. Kassaundra Hair, PharmD, BCIDP, AAHIVP, CPP Clinical Pharmacist Practitioner - Infectious Diseases Clinical Pharmacist Lead - Specialty Pharmacy [facility]

## 2024-09-26 ENCOUNTER — Encounter: Admitting: Pharmacist

## 2024-09-26 ENCOUNTER — Other Ambulatory Visit: Payer: Self-pay | Admitting: Obstetrics & Gynecology

## 2024-09-26 ENCOUNTER — Ambulatory Visit: Admission: RE | Admit: 2024-09-26 | Discharge: 2024-09-26 | Disposition: A | Source: Ambulatory Visit

## 2024-09-26 ENCOUNTER — Other Ambulatory Visit: Payer: Self-pay

## 2024-09-26 ENCOUNTER — Other Ambulatory Visit: Payer: Self-pay | Admitting: Obstetrics and Gynecology

## 2024-09-26 DIAGNOSIS — Z1231 Encounter for screening mammogram for malignant neoplasm of breast: Secondary | ICD-10-CM

## 2024-10-01 ENCOUNTER — Ambulatory Visit: Admitting: Pharmacist

## 2024-10-01 ENCOUNTER — Other Ambulatory Visit: Payer: Self-pay

## 2024-10-01 DIAGNOSIS — B20 Human immunodeficiency virus [HIV] disease: Secondary | ICD-10-CM

## 2024-10-01 MED ORDER — CABOTEGRAVIR & RILPIVIRINE ER 600 & 900 MG/3ML IM SUER
1.0000 | Freq: Once | INTRAMUSCULAR | Status: AC
  Administered 2024-10-01: 1 via INTRAMUSCULAR

## 2024-10-01 NOTE — Progress Notes (Signed)
 HPI: Julie Fox is a 48 y.o. female who presents to the [facility] pharmacy clinic for Cabenuva  administration.  Referring ID Provider: Dr. Luiz  Patient Active Problem List   Diagnosis Date Noted   Healthcare maintenance 07/30/2024   Obesity with body mass index 30 or greater 07/27/2022   Easy bruising 07/27/2022   Eruption cyst 07/27/2022   Allergic rhinitis 07/27/2022   Acute otitis media 07/27/2022   Anemia 07/27/2022   Blood coagulation disorder 07/27/2022   Cellulitis 07/27/2022   Cough 07/27/2022   Depressive disorder 07/27/2022   Disorder of coccyx 07/27/2022   Dysphasia 07/27/2022   Eustachian tube dysfunction 07/27/2022   Genital herpes simplex 07/27/2022   Goiter 07/27/2022   Insomnia 07/27/2022   Migraine 07/27/2022   Malaise and fatigue 07/27/2022   Lymphadenopathy 07/27/2022   Neck pain 07/27/2022   Numbness 07/27/2022   Abscess of oral tissue 07/27/2022   Cellulitis of oral soft tissues 07/27/2022   Disorder of tongue 07/27/2022   Pharyngitis 07/27/2022   Sinusitis 07/27/2022   Human immunodeficiency virus infection (HCC) 07/27/2022   Anxiety 04/05/2022   Morbid obesity with BMI of 45.0-49.9, adult (HCC) 04/05/2022   HIV infection (HCC) 04/05/2022   High cholesterol 04/05/2022   Pelvic mass in female    Transaminitis 08/18/2020   Vitamin D  deficiency 08/18/2020   Class 3 severe obesity with serious comorbidity and body mass index (BMI) of 40.0 to 44.9 in adult (HCC) 08/18/2020   Acid reflux 08/09/2018   History of adenomatous polyp of colon 07/12/2018   Internal hemorrhoids 07/12/2018   Mild single current episode of major depressive disorder 06/20/2018   Post-operative state 01/03/2018   Abnormal uterine bleeding (AUB) 12/12/2017   GAD (generalized anxiety disorder) 06/13/2017   Hyperlipidemia 06/13/2017   HIV disease (HCC) 12/23/2016    Patient's Medications  New Prescriptions   No medications on file  Previous Medications   CABENUVA   600 & 900 MG/3ML INJECTION      Modified Medications   No medications on file  Discontinued Medications   No medications on file    Allergies: Allergies  Allergen Reactions   Penicillins Anaphylaxis    Has patient had a PCN reaction causing immediate rash, facial/tongue/throat swelling, SOB or lightheadedness with hypotension: yes Has patient had a PCN reaction causing severe rash involving mucus membranes or skin necrosis: no Has patient had a PCN reaction that required hospitalization: no Has patient had a PCN reaction occurring within the last 10 years: yes If all of the above answers are NO, then may proceed with Cephalosporin use.    Nsaids Other (See Comments)   Sulfa Antibiotics Rash    Labs: Lab Results  Component Value Date   HIV1RNAQUANT NOT DETECTED 05/25/2024   HIV1RNAQUANT <20 (H) 11/30/2023   HIV1RNAQUANT Not Detected 07/11/2023   CD4TABS 1,083 11/30/2023   CD4TABS 1,613 03/28/2023   CD4TABS 1,375 08/03/2021    RPR and STI Lab Results  Component Value Date   LABRPR NON-REACTIVE 11/30/2023   LABRPR NON-REACTIVE 12/01/2021   LABRPR NON-REACTIVE 08/03/2021   LABRPR NON-REACTIVE 02/04/2021   LABRPR NON-REACTIVE 07/23/2020    STI Results GC CT  04/02/2022 11:11 AM Negative  Negative   12/27/2018 12:00 AM Negative  Negative   09/27/2017 12:00 AM Negative  Negative   09/27/2016 12:00 AM Negative  Negative     Hepatitis B Lab Results  Component Value Date   HEPBSAB REACTIVE (A) 09/02/2021   HEPBSAG NON-REACTIVE 07/23/2020   HEPBCAB NON  REACTIVE 09/27/2016   Hepatitis C Lab Results  Component Value Date   HEPCAB NON-REACTIVE 09/02/2021   Hepatitis A Lab Results  Component Value Date   HAV NON REACTIVE 09/27/2016   Lipids: Lab Results  Component Value Date   CHOL 221 (H) 12/01/2021   TRIG 154 (H) 12/01/2021   HDL 44 (L) 12/01/2021   CHOLHDL 5.0 (H) 12/01/2021   VLDL 26.2 06/20/2018   LDLCALC 148 (H) 12/01/2021    Target Date: The  4th  Assessment: Daje presents today for her maintenance Cabenuva  injections. Past injections were tolerated well without issues. Last HIV RNA was not detected in August. Doing well with no issues today.  Lab work:  None today  Eligible vaccinations:  Currently up to date on all recommended vaccines.   Cabenuva : Administered cabotegravir  600mg /19mL in left upper outer quadrant of the gluteal muscle. Administered rilpivirine  900 mg/3mL in the right upper outer quadrant of the gluteal muscle. No issues with injections. She will follow up in 2 months for next set of injections.  Plan: - Cabenuva  injections administered - Next injections scheduled for 11/28/24 - Call with any issues or questions  Sanoe Hazan L. Tamar Lipscomb, PharmD, BCIDP, AAHIVP, CPP Clinical Pharmacist Practitioner - Infectious Diseases Clinical Pharmacist Lead - Specialty Pharmacy [facility]

## 2024-10-02 ENCOUNTER — Ambulatory Visit: Payer: Self-pay | Admitting: Obstetrics and Gynecology

## 2024-11-21 ENCOUNTER — Other Ambulatory Visit (HOSPITAL_COMMUNITY): Payer: Self-pay

## 2024-11-22 ENCOUNTER — Other Ambulatory Visit (HOSPITAL_COMMUNITY): Payer: Self-pay

## 2024-11-28 ENCOUNTER — Other Ambulatory Visit: Payer: Self-pay

## 2024-11-28 ENCOUNTER — Ambulatory Visit: Admitting: Pharmacist

## 2024-11-28 DIAGNOSIS — B20 Human immunodeficiency virus [HIV] disease: Secondary | ICD-10-CM | POA: Diagnosis not present

## 2024-11-28 MED ORDER — CABOTEGRAVIR & RILPIVIRINE ER 600 & 900 MG/3ML IM SUER
1.0000 | Freq: Once | INTRAMUSCULAR | Status: AC
  Administered 2024-11-28: 1 via INTRAMUSCULAR

## 2024-11-28 NOTE — Patient Instructions (Signed)
 [facility]

## 2025-01-23 ENCOUNTER — Ambulatory Visit: Payer: Self-pay | Admitting: Pharmacist

## 2025-03-21 ENCOUNTER — Ambulatory Visit: Payer: Self-pay | Admitting: Internal Medicine
